# Patient Record
Sex: Male | Born: 1960 | Race: White | Hispanic: No | Marital: Married | Smoking: Former smoker
Health system: Southern US, Community
[De-identification: ages and names within clinical notes are randomized; demographics above are authoritative.]

## PROBLEM LIST (undated history)

## (undated) DIAGNOSIS — E119 Type 2 diabetes mellitus without complications: Secondary | ICD-10-CM

## (undated) DIAGNOSIS — I10 Essential (primary) hypertension: Secondary | ICD-10-CM

## (undated) DIAGNOSIS — K5900 Constipation, unspecified: Secondary | ICD-10-CM

## (undated) DIAGNOSIS — R0600 Dyspnea, unspecified: Secondary | ICD-10-CM

## (undated) DIAGNOSIS — E7800 Pure hypercholesterolemia, unspecified: Secondary | ICD-10-CM

## (undated) HISTORY — PX: NO PAST SURGERIES: SHX2092

---

## 2019-01-13 ENCOUNTER — Other Ambulatory Visit: Payer: Self-pay

## 2019-02-15 ENCOUNTER — Other Ambulatory Visit: Payer: Self-pay

## 2019-07-24 ENCOUNTER — Emergency Department
Admission: EM | Admit: 2019-07-24 | Discharge: 2019-07-24 | Disposition: A | Discharge status: Home / Self Care | Payer: Self-pay | Source: Home / Self Care | Attending: Emergency Medicine | Admitting: Emergency Medicine

## 2019-07-24 ENCOUNTER — Telehealth: Payer: Self-pay | Admitting: Emergency Medicine

## 2019-07-24 ENCOUNTER — Other Ambulatory Visit: Payer: Self-pay

## 2019-07-24 DIAGNOSIS — L97922 Non-pressure chronic ulcer of unspecified part of left lower leg with fat layer exposed: Secondary | ICD-10-CM

## 2019-07-24 DIAGNOSIS — L03818 Cellulitis of other sites: Secondary | ICD-10-CM

## 2019-07-24 DIAGNOSIS — E0841 Diabetes mellitus due to underlying condition with diabetic mononeuropathy: Secondary | ICD-10-CM

## 2019-07-24 DIAGNOSIS — R739 Hyperglycemia, unspecified: Secondary | ICD-10-CM

## 2019-07-24 DIAGNOSIS — S91109A Unspecified open wound of unspecified toe(s) without damage to nail, initial encounter: Secondary | ICD-10-CM

## 2019-07-24 DIAGNOSIS — Z23 Encounter for immunization: Secondary | ICD-10-CM

## 2019-07-24 DIAGNOSIS — L97912 Non-pressure chronic ulcer of unspecified part of right lower leg with fat layer exposed: Secondary | ICD-10-CM

## 2019-07-24 HISTORY — DX: Essential (primary) hypertension: I10

## 2019-07-24 HISTORY — DX: Pure hypercholesterolemia, unspecified: E78.00

## 2019-07-24 HISTORY — DX: Type 2 diabetes mellitus without complications: E11.9

## 2019-07-24 LAB — POCT FASTING CBG KUC MANUAL ENTRY: POCT Glucose (KUC): 265 mg/dL — AB (ref 70–99)

## 2019-07-24 MED ORDER — MUPIROCIN 2 % EX OINT: TOPICAL_OINTMENT | CUTANEOUS | 0 refills | Status: DC

## 2019-07-24 MED ORDER — AMOXICILLIN-POT CLAVULANATE 875-125 MG PO TABS: 1.0000 | ORAL_TABLET | Freq: Two times a day (BID) | ORAL | 0 refills | Status: DC

## 2019-07-24 MED ORDER — TETANUS-DIPHTH-ACELL PERTUSSIS 5-2.5-18.5 LF-MCG/0.5 IM SUSP
0.5000 mL | Freq: Once | INTRAMUSCULAR | Status: AC
  Administered 2019-07-24: 0.5 mL via INTRAMUSCULAR

## 2019-07-24 NOTE — ED Triage Notes (Signed)
Pt was at the pool 4-5 days ago, was walking on the pool deck and now has blisters on both feet, bleeding and red.  Also pain throbbing from about 6" above ankle down into feet.

## 2019-07-24 NOTE — ED Provider Notes (Signed)
[name]    CSN: [id number] Arrival date & time: 07/24/19  0901     History   Chief Complaint Chief Complaint  Patient presents with  . Foot Pain    HPI Melvin [name] is a 58 y.o. male.  Patient was in his usual state of health until Saturday which is 6 days ago when he was at the pool walking on hot concrete and suffered injuries to the great toes bilaterally.  Over the week he has developed redness and drainage over both toes.  He has severe pain when he stands.  He has been cleaning the areas with soap and water and applying bacitracin.  Of note he has a long history of diabetes currently on oral hypoglycemics as well as gabapentin for neuropathy. HPI  Past Medical History:  Diagnosis Date  . Diabetes mellitus without complication ([place])   . High cholesterol   . Hypertension     There are no active problems to display for this patient.   History reviewed. No pertinent surgical history.     Home Medications    Prior to Admission medications   Medication Sig Start Date End Date Taking? Authorizing Provider  atorvastatin (LIPITOR) 10 MG tablet Take 10 mg by mouth daily.   Yes [provider]  gabapentin (NEURONTIN) 300 MG capsule Take 300 mg by mouth 3 (three) times daily.   Yes [provider]  glyBURIDE (DIABETA) 2.5 MG tablet Take 2.5 mg by mouth daily with breakfast.   Yes [provider]  metFORMIN (GLUCOPHAGE) 1000 MG tablet Take 1,000 mg by mouth 2 (two) times daily with a meal.   Yes [provider]  amoxicillin-clavulanate (AUGMENTIN) 875-125 MG tablet Take 1 tablet by mouth 2 (two) times daily. 07/24/19   Melvin Russian, MD  mupirocin ointment (BACTROBAN) 2 % Apply twice a day to the undersurface of great toes 07/24/19   Atlas Kuc, Loura Back, MD    Family History History reviewed. No pertinent family history.  Social History Social History   Tobacco Use  . Smoking status: Never Smoker  . Smokeless tobacco: Never  Used  Substance Use Topics  . Alcohol use: Not on file    Comment: occ  . Drug use: Not on file     Allergies   Patient has no known allergies.   Review of Systems Review of Systems  Constitutional: Negative for fever.  Respiratory: Negative.   Cardiovascular: Negative.   Musculoskeletal: Negative.   Skin:       Patient has developed blisters over the undersurface of both great toes with significant pain and discomfort.     Physical Exam Triage Vital Signs ED Triage Vitals  Enc Vitals Group     BP 07/24/19 0956 (!) 131/93     Pulse Rate 07/24/19 0956 (!) 103     Resp 07/24/19 0956 20     Temp 07/24/19 0956 98 F (36.7 C)     Temp Source 07/24/19 0956 Oral     SpO2 07/24/19 0956 99 %     Weight 07/24/19 0957 262 lb (118.8 kg)     Height 07/24/19 0957 6\' 3"  (1.905 m)     Head Circumference --      Peak Flow --      Pain Score 07/24/19 0957 8     Pain Loc --      Pain Edu? --      Excl. in [place]? --    No data found.  Updated  Vital Signs BP (!) 131/93 (BP Location: Right Arm)   Pulse (!) 103   Temp 98 F (36.7 C) (Oral)   Resp 20   Ht 6\' 3"  (1.905 m)   Wt 118.8 kg   SpO2 99%   BMI 32.75 kg/m   Visual Acuity Right Eye Distance:   Left Eye Distance:   Bilateral Distance:    Right Eye Near:   Left Eye Near:    Bilateral Near:     Physical Exam Constitutional:      Appearance: Normal appearance.  Cardiovascular:     Pulses: Normal pulses.  Skin:    Comments: Examination of the lower extremities reveals a 3 x 3 cm blister areas over both great toes on the plantar surface.  There is necrotic tissue beneath dead epidermis.  The nonviable epidermis was removed by blunt dissection.  Bacitracin was applied to these areas followed by Telfa and a sterile dressing.  Neurological:     Mental Status: He is alert.   Procedure note: There were areas of dead tissue present over the plantar surface of both great toes.  These areas of skin were removed.  No  anesthesia was required due to the patient's neuropathy.  Underneath the skin was evidence of necrotic type material.  This will need further debridement in the future.  Bacitracin ointment was applied followed by Telfa and a sterile dressing.  Patient tolerated the procedure well.   UC Treatments / Results  Labs (all labs ordered are listed, but only abnormal results are displayed) Labs Reviewed  POCT FASTING [facility] MANUAL ENTRY - Abnormal; Notable for the following components:      Result Value   POCT Glucose ([facility]) 265 (*)    All other components within normal limits    EKG   Radiology No results found.  Procedures Procedures (including critical care time)  Medications Ordered in UC Medications  Tdap (BOOSTRIX) injection 0.5 mL (0.5 mLs Intramuscular Given 07/24/19 1047)    Initial Impression / Assessment and Plan / UC Course  I have reviewed the triage vital signs and the nursing notes. This patient has very poorly controlled diabetes.  Glucose checked in the office was 265.  He has neuropathy.  He developed burns to the undersurface of both great toes due to walking on hot concrete and inability to feel secondary to his neuropathy.  I advised him he will need to have further debridement of these areas.  I cautioned him about watching for worsening cellulitis.  He does have some redness of the great toes proximal to the wounds.  There is no evidence of cellulitis involving the feet.  Pulses were symmetrical. Pertinent labs & imaging results that were available during my care of the patient were reviewed by me and considered in my medical decision making (see chart for details).  Dr. Georgina Snell was kind enough to visualize the wounds and he will arrange follow-up with his office next week.  Patient will be on Augmentin twice a day along with application Bactroban ointment.  He is instructed to return to clinic if any worsening.  He will increase his Neurontin to 4 tablets a day.       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 Take oral antibiotics twice a day. Follow-up with Dr. Georgina Snell next week. If you become ill or start running fever or have progressive redness of your foot or ankle please return to the urgent care immediately.    ED Prescriptions    Medication Sig Dispense Auth. Provider   amoxicillin-clavulanate (AUGMENTIN) 875-125 MG tablet Take 1 tablet by mouth 2 (two) times daily. 20 tablet Melvin Russian, MD   mupirocin ointment (BACTROBAN) 2 % Apply twice a day to the undersurface of great toes 30 g Melvin Russian, MD     Controlled Substance Prescriptions [place] Controlled Substance Registry consulted? Not Applicable   Melvin Russian, MD 07/24/19 1323

## 2019-07-24 NOTE — Telephone Encounter (Signed)
I called and spoke with the patient and advised him to recheck in 48 to 72 hours if not improving.  If the wounds seem to be healing he will follow-up Monday afternoon with Dr. Joseph Art.  He has a follow-up appointment with Dr. Georgina Snell in 1 week.

## 2019-07-24 NOTE — Discharge Instructions (Signed)
Clean wound with Hibiclens twice a day followed by application of your antibiotic ointment. Take oral antibiotics twice a day. Follow-up with Dr. Georgina Snell next week. If you become ill or start running fever or have progressive redness of your foot or ankle please return to the urgent care immediately.

## 2019-07-28 ENCOUNTER — Emergency Department (INDEPENDENT_AMBULATORY_CARE_PROVIDER_SITE_OTHER)
Admission: EM | Admit: 2019-07-28 | Discharge: 2019-07-28 | Disposition: A | Discharge status: Home / Self Care | Payer: Self-pay | Source: Home / Self Care

## 2019-07-28 ENCOUNTER — Encounter: Payer: Self-pay | Admitting: Family Medicine

## 2019-07-28 ENCOUNTER — Other Ambulatory Visit: Payer: Self-pay

## 2019-07-28 DIAGNOSIS — Z5189 Encounter for other specified aftercare: Secondary | ICD-10-CM

## 2019-07-28 NOTE — Discharge Instructions (Addendum)
Continue the antibiotic cream and tablets Daily dressing change

## 2019-07-28 NOTE — ED Provider Notes (Signed)
[facility]    CSN: [id number] Arrival date & time: 07/28/19  1312     History   Chief Complaint Chief Complaint  Patient presents with  . Wound Check    HPI Melvin [name] is a 58 y.o. male.   58 yo established patient returns for recheck of toes, burned when he was walking on hot cement at side of swimming pool last week.  Injury incurred 10 days ago.  Patient has follow up with Dr. Georgina Snell in several days.  Associated problem:  Uncontrolled diabetes Current med for problem:  augmentin and mupirocin  Patient rec'd Tdap 4 days ago.     Past Medical History:  Diagnosis Date  . Diabetes mellitus without complication ([place])   . High cholesterol   . Hypertension     There are no active problems to display for this patient.   History reviewed. No pertinent surgical history.     Home Medications    Prior to Admission medications   Medication Sig Start Date End Date Taking? Authorizing Provider  amoxicillin-clavulanate (AUGMENTIN) 875-125 MG tablet Take 1 tablet by mouth 2 (two) times daily. 07/24/19   Darlyne Russian, MD  atorvastatin (LIPITOR) 10 MG tablet Take 10 mg by mouth daily.    [provider]  gabapentin (NEURONTIN) 300 MG capsule Take 300 mg by mouth 3 (three) times daily.    [provider]  glyBURIDE (DIABETA) 2.5 MG tablet Take 2.5 mg by mouth daily with breakfast.    [provider]  metFORMIN (GLUCOPHAGE) 1000 MG tablet Take 1,000 mg by mouth 2 (two) times daily with a meal.    [provider]  mupirocin ointment (BACTROBAN) 2 % Apply twice a day to the undersurface of great toes 07/24/19   Daub, Loura Back, MD    Family History History reviewed. No pertinent family history.  Social History Social History   Tobacco Use  . Smoking status: Never Smoker  . Smokeless tobacco: Never Used  Substance Use Topics  . Alcohol use: Not on file    Comment: occ  . Drug use: Not on file     Allergies   Patient has  no known allergies.   Review of Systems Review of Systems   Physical Exam Triage Vital Signs ED Triage Vitals  Enc Vitals Group     BP      Pulse      Resp      Temp      Temp src      SpO2      Weight      Height      Head Circumference      Peak Flow      Pain Score      Pain Loc      Pain Edu?      Excl. in [place]?    No data found.  Updated Vital Signs BP (!) 141/90 (BP Location: Right Arm)   Pulse (!) 115   Temp 98.1 F (36.7 C) (Oral)   Resp 20   Ht 6\' 3"  (1.905 m)   Wt 119.7 kg   SpO2 99%   BMI 33.00 kg/m    Physical Exam Vitals signs and nursing note reviewed.  Constitutional:      Appearance: Normal appearance.  Eyes:     Conjunctiva/sclera: Conjunctivae normal.  Neck:     Musculoskeletal: Normal range of motion and neck supple.  Cardiovascular:     Rate and Rhythm: Normal rate.  Pulmonary:     Effort: Pulmonary effort is normal.  Musculoskeletal: Normal range of motion.  Skin:    General: Skin is warm.     Comments: Bilateral great toe blisters are showing new epithelialization nicely without signs of infection  Neurological:     General: No focal deficit present.     Mental Status: He is alert.     Sensory: Sensory deficit present.  Psychiatric:        Mood and Affect: Mood normal.        Behavior: Behavior normal.        UC Treatments / Results  Labs (all labs ordered are listed, but only abnormal results are displayed) Labs Reviewed - No data to display  EKG   Radiology No results found.  Procedures Procedures (including critical care time)  Medications Ordered in UC Medications - No data to display  Initial Impression / Assessment and Plan / UC Course  I have reviewed the triage vital signs and the nursing notes.  Pertinent labs & imaging results that were available during my care of the patient were reviewed by me and considered in my medical decision making (see chart for details).    Final Clinical  Impressions(s) / UC Diagnoses   Final diagnoses:  Visit for wound check     Discharge Instructions     Continue the antibiotic cream and tablets Daily dressing change    ED Prescriptions    None     Controlled Substance Prescriptions [place] Controlled Substance Registry consulted? Not Applicable   Robyn Haber, MD 07/28/19 1357

## 2019-07-28 NOTE — ED Triage Notes (Signed)
Pt here for followup on foot ulcers

## 2019-07-31 ENCOUNTER — Ambulatory Visit: Payer: Self-pay | Admitting: Family Medicine

## 2019-07-31 ENCOUNTER — Encounter: Payer: Self-pay | Admitting: Family Medicine

## 2019-07-31 ENCOUNTER — Other Ambulatory Visit: Payer: Self-pay

## 2019-07-31 ENCOUNTER — Ambulatory Visit (INDEPENDENT_AMBULATORY_CARE_PROVIDER_SITE_OTHER): Payer: Self-pay | Admitting: Family Medicine

## 2019-07-31 VITALS — BP 137/96 | HR 103 | Temp 98.3°F | Wt 267.0 lb

## 2019-07-31 DIAGNOSIS — E785 Hyperlipidemia, unspecified: Secondary | ICD-10-CM

## 2019-07-31 DIAGNOSIS — T25229A Burn of second degree of unspecified foot, initial encounter: Secondary | ICD-10-CM

## 2019-07-31 DIAGNOSIS — E1142 Type 2 diabetes mellitus with diabetic polyneuropathy: Secondary | ICD-10-CM

## 2019-07-31 MED ORDER — METFORMIN HCL 1000 MG PO TABS: 1000.0000 mg | ORAL_TABLET | Freq: Two times a day (BID) | ORAL | 1 refills | Status: DC

## 2019-07-31 MED ORDER — GLIMEPIRIDE 4 MG PO TABS: 4.0000 mg | ORAL_TABLET | Freq: Every day | ORAL | 3 refills | Status: DC

## 2019-07-31 MED ORDER — ATORVASTATIN CALCIUM 10 MG PO TABS: 10.0000 mg | ORAL_TABLET | Freq: Every day | ORAL | 1 refills | Status: DC

## 2019-07-31 MED ORDER — GABAPENTIN 300 MG PO CAPS: 300.0000 mg | ORAL_CAPSULE | Freq: Three times a day (TID) | ORAL | 1 refills | Status: DC

## 2019-07-31 NOTE — Progress Notes (Signed)
Note duplication 

## 2019-07-31 NOTE — Progress Notes (Signed)
Melvin [name] is a 58 y.o. male who presents to [facility]: Primary Care Sports Medicine today for follow-up on BL diabetic foot ulcers.  Foot Ulcers: Melvin [name] visited Urgent Care on 07/28/2019 for ulcers on plantar aspects of distal phalanxes of great toes BL. Patient has Hx of diabetic neuropathy and he reported the injury event as walking on hot concrete for a brief time. Ulcers have been improving with care, rest, Mupirocin ointment, and oral Augmentin. Patient reports complete improvement of pain. He has been taking time off from work. 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  Type-2 Diabetes: Due to patient's self-pay status, he has not been followed by a regular PCP. He takes Metformin and Glyburide. Also takes Gabapentin for neuropathy. He has been taking 4 Gabapentin all at once in the morning as opposed to 4 times a day. Reports that his last A1c was taken in [place] at [facility] in [place]. Thought A1c was 12.  Self-Pay Status: Patient is currently self-pay. Reports that he will be granted health insurance benefits after working at his current job for 60-70 days.   ROS as above: No fevers chills nausea vomiting diarrhea chest pain palpitation shortness of breath.  Exam:  BP (!) 137/96   Pulse (!) 103   Temp 98.3 F (36.8 C) (Oral)   Wt 267 lb (121.1 kg)   BMI 33.37 kg/m  Wt Readings from Last 5 Encounters:  07/31/19 267 lb (121.1 kg)  07/28/19 264 lb (119.7 kg)  07/24/19 262 lb (118.8 kg)    Gen: Well NAD HEENT: EOMI,  MMM Lungs: Normal work of breathing. CTABL Heart: RRR no MRG Abd: NABS, Soft. Nondistended, Nontender Exts: BL ulcers over plantar aspects of distal phalanxes of great toes. Ulcers show good granulation tissue and no erythema extending around margins.  Sensation is intact  on plantar and dorsal foot surfaces BL. Brisk capillary refill, warm  and well perfused.   Lab and Radiology Results No results found for this or any previous visit (from the past 72 hour(s)). No results found.    Assessment and Plan: 58 y.o. male with Type-2 Diabetes is following up on BL foot ulcers.  Foot Ulcers: Melvin [name] visited Urgent Care on 07/28/2019 for ulcers on plantar aspects of distal phalanxes of great toes BL. Patient has Hx of diabetic neuropathy and reported the injury event as walking on hot concrete for a brief time. Ulcers have been improving with care, rest, Mupirocin ointment, and oral Augmentin. Patient reports complete improvement of pain. He has been taking time off from work. 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   Recommend continued care and rest. Placed first ray posts beneath insoles of shoes to take pressure off great toes. Can return to work on 8/24.  Type-2 Diabetes: Due to patient's self-pay status, he has not been followed by a regular PCP. He takes Metformin and Glyburide. Also takes Gabapentin for neuropathy. He has been taking 4 Gabapentin all at once in the morning as opposed to 4 times a day. Reports that his last A1c was taken in [place] at [facility] in [place]. Thought A1c was 12 suggesting uncontrolled diabetes.  Switched Glyburide to Glimepiride. Advised patient to take Gabapentin 3 times a day instead of all at one time. Will attempt to get records from [facility] in [place].  Ordered A1c, CMP, and Lipid  panel to assess diabetes, kidney, and lipid status.  Follow-up with Dr. Sheppard Coil in 3 months.  Self-Pay Status: Patient is currently self-pay. Reports that he will be granted health insurance benefits after working at his current job for 60-70 days.  Prior PCP was  Primary Care  [facility] in [facility] [place] [address] [place], [place] [address] [place]  [phone] Fax: [phone]   PDMP not reviewed this encounter.  Orders Placed This Encounter  Procedures  . COMPLETE METABOLIC PANEL WITH GFR  . Lipid Panel w/reflex Direct LDL  . Hemoglobin A1c   Meds ordered this encounter  Medications  . gabapentin (NEURONTIN) 300 MG capsule    Sig: Take 1 capsule (300 mg total) by mouth 3 (three) times daily.    Dispense:  270 capsule    Refill:  1  . glimepiride (AMARYL) 4 MG tablet    Sig: Take 1 tablet (4 mg total) by mouth daily before breakfast.    Dispense:  30 tablet    Refill:  3  . atorvastatin (LIPITOR) 10 MG tablet    Sig: Take 1 tablet (10 mg total) by mouth daily.    Dispense:  90 tablet    Refill:  1  . metFORMIN (GLUCOPHAGE) 1000 MG tablet    Sig: Take 1 tablet (1,000 mg total) by mouth 2 (two) times daily with a meal.    Dispense:  180 tablet    Refill:  1     Historical information moved to improve visibility of documentation.  Past Medical History:  Diagnosis Date  . Diabetes mellitus without complication ([place])   . High cholesterol   . Hypertension    No past surgical history on file. Social History   Tobacco Use  . Smoking status: Never Smoker  . Smokeless tobacco: Never Used  Substance Use Topics  . Alcohol use: Not on file    Comment: occ   family history is not on file.  Medications: Current Outpatient Medications  Medication Sig Dispense Refill  . amoxicillin-clavulanate (AUGMENTIN) 875-125 MG tablet Take 1 tablet by mouth 2 (two) times daily. 20 tablet 0  . atorvastatin (LIPITOR) 10 MG tablet Take 1 tablet (10 mg total) by mouth daily. 90 tablet 1  . gabapentin (NEURONTIN) 300 MG capsule Take 1 capsule (300 mg total) by mouth 3 (three) times daily. 270 capsule 1  . glimepiride (AMARYL) 4 MG tablet Take 1 tablet (4 mg total) by mouth daily before breakfast. 30 tablet 3  . metFORMIN (GLUCOPHAGE) 1000 MG tablet Take 1 tablet (1,000 mg total) by mouth 2 (two) times daily with a meal. 180 tablet 1  . mupirocin ointment (BACTROBAN) 2 % Apply twice a day to the  undersurface of great toes 30 g 0   No current facility-administered medications for this visit.    No Known Allergies   Discussed warning signs or symptoms. Please see discharge instructions. Patient expresses understanding.  I personally was present and performed or re-performed the history, physical exam and medical decision-making activities of this service and have verified that the service and findings are accurately documented in the student's note. ___________________________________________ Lynne Leader M.D., ABFM., CAQSM. Primary Care and Sports Medicine Adjunct Instructor of [employer]

## 2019-07-31 NOTE — Patient Instructions (Addendum)
Thank you for coming in today. Continue wound care at work.  Return to work on 8/24. If you cannot return let me know and I can modify the note.  Get labs fasting in the near future.  Sschedlue diabetes visiti with Dr Sheppard Coil in 3 months.   Continue medicine.  New prescription sent to [facility].  Use good Rx card.

## 2019-08-01 DIAGNOSIS — E1142 Type 2 diabetes mellitus with diabetic polyneuropathy: Secondary | ICD-10-CM | POA: Insufficient documentation

## 2019-08-01 DIAGNOSIS — T25229A Burn of second degree of unspecified foot, initial encounter: Secondary | ICD-10-CM | POA: Insufficient documentation

## 2019-08-01 DIAGNOSIS — E785 Hyperlipidemia, unspecified: Secondary | ICD-10-CM | POA: Insufficient documentation

## 2019-08-05 LAB — LIPID PANEL W/REFLEX DIRECT LDL
Cholesterol: 132 mg/dL (ref <200)
HDL: 33 mg/dL — ABNORMAL LOW (ref >=40)
LDL Cholesterol (Calc): 79 mg/dL (calc)
Non-HDL Cholesterol (Calc): 99 mg/dL (calc) (ref <130)
Total CHOL/HDL Ratio: 4 (calc) (ref <5.0)
Triglycerides: 118 mg/dL (ref <150)

## 2019-08-05 LAB — COMPLETE METABOLIC PANEL WITH GFR
AG Ratio: 1.5 (calc) (ref 1.0–2.5)
ALT: 26 U/L (ref 9–46)
AST: 15 U/L (ref 10–35)
Albumin: 4 g/dL (ref 3.6–5.1)
Alkaline phosphatase (APISO): 66 U/L (ref 35–144)
BUN: 15 mg/dL (ref 7–25)
CO2: 28 mmol/L (ref 20–32)
Calcium: 9.5 mg/dL (ref 8.6–10.3)
Chloride: 101 mmol/L (ref 98–110)
Creat: 0.7 mg/dL (ref 0.70–1.33)
GFR, Est African American: 122 mL/min/{1.73_m2} (ref >=60)
GFR, Est Non African American: 105 mL/min/{1.73_m2} (ref >=60)
Globulin: 2.6 g/dL (calc) (ref 1.9–3.7)
Glucose, Bld: 240 mg/dL — ABNORMAL HIGH (ref 65–99)
Potassium: 4.6 mmol/L (ref 3.5–5.3)
Sodium: 138 mmol/L (ref 135–146)
Total Bilirubin: 0.4 mg/dL (ref 0.2–1.2)
Total Protein: 6.6 g/dL (ref 6.1–8.1)

## 2019-08-05 LAB — HEMOGLOBIN A1C
Hgb A1c MFr Bld: 10.4 % of total Hgb — ABNORMAL HIGH (ref <5.7)
Mean Plasma Glucose: 252 (calc)
eAG (mmol/L): 13.9 (calc)

## 2019-10-31 ENCOUNTER — Other Ambulatory Visit: Payer: Self-pay

## 2019-10-31 ENCOUNTER — Ambulatory Visit (INDEPENDENT_AMBULATORY_CARE_PROVIDER_SITE_OTHER): Payer: Self-pay | Admitting: Osteopathic Medicine

## 2019-10-31 ENCOUNTER — Encounter: Payer: Self-pay | Admitting: Osteopathic Medicine

## 2019-10-31 VITALS — BP 138/90 | HR 92 | Temp 97.8°F | Wt 262.0 lb

## 2019-10-31 DIAGNOSIS — E1142 Type 2 diabetes mellitus with diabetic polyneuropathy: Secondary | ICD-10-CM

## 2019-10-31 DIAGNOSIS — Z23 Encounter for immunization: Secondary | ICD-10-CM

## 2019-10-31 LAB — POCT GLYCOSYLATED HEMOGLOBIN (HGB A1C): Hemoglobin A1C: 10.8 % — AB (ref 4.0–5.6)

## 2019-10-31 MED ORDER — METFORMIN HCL 1000 MG PO TABS: 1000.0000 mg | ORAL_TABLET | Freq: Two times a day (BID) | ORAL | 1 refills | Status: DC

## 2019-10-31 MED ORDER — LOSARTAN POTASSIUM 25 MG PO TABS: 25.0000 mg | ORAL_TABLET | Freq: Every day | ORAL | 1 refills | Status: DC

## 2019-10-31 MED ORDER — ATORVASTATIN CALCIUM 10 MG PO TABS: 10.0000 mg | ORAL_TABLET | Freq: Every day | ORAL | 1 refills | Status: DC

## 2019-10-31 MED ORDER — GLIMEPIRIDE 4 MG PO TABS: 4.0000 mg | ORAL_TABLET | Freq: Every day | ORAL | 1 refills | Status: DC

## 2019-10-31 NOTE — Progress Notes (Signed)
HPI: Melvin [name] is a 58 y.o. male who  has a past medical history of Diabetes mellitus without complication ([employer]), High cholesterol, and Hypertension.  he presents to [facility] today, 10/31/19,  for chief complaint of:  Diabetes follow-up   DIABETES SCREENING/PREVENTIVE CARE: A1C past 3-6 mos: Yes  controlled? No   08/04/19: 10.4% - Dr Georgina Snell had Rx Metformin 1000 mg bid and Glimepiride 4 mg daily (changed from Glyburide 2.5 mg daily)   Today 10/31/19: 10.8%  BP goal <130/80: No   BP Readings from Last 3 Encounters:  10/31/19 138/90  07/31/19 (!) 137/96  07/28/19 (!) 141/90   LDL goal <70: pretty close, 79 in 07/2019 Eye exam annually: none on file, importance discussed with patient Foot exam: needs Microalbuminuria:needs but will discuss ACE/ARB   Metformin: Yes  ACE/ARB: No  Antiplatelet if ASCVD Risk >10%: No  Statin: Yes but low-potency  Pneumovax: No  Immunization History  Administered Date(s) Administered  . Tdap 07/24/2019  . Zoster Recombinat (Shingrix) [id number]   DM2 complications: Neuropathy w/ history of foot ulceration d/t walking on hot concrete and not feeling it  Patient is accompanied by wife who assists with history-taking.    At today's visit 10/31/19 ... PMH, PSH, FH reviewed and updated as needed.  Current medication list and allergy/intolerance hx reviewed and updated as needed. (See remainder of HPI, ROS, Phys Exam below)   No results found.  Results for orders placed or performed in visit on 10/31/19 (from the past 72 hour(s))  POCT HgB A1C     Status: Abnormal   Collection Time: 10/31/19 10:23 AM  Result Value Ref Range   Hemoglobin A1C 10.8 (A) 4.0 - 5.6 %   HbA1c POC (<> result, manual entry)     HbA1c, POC (prediabetic range)     HbA1c, POC (controlled diabetic range)            ASSESSMENT/PLAN: The primary encounter diagnosis was Type 2 diabetes mellitus with diabetic polyneuropathy,  without long-term current use of insulin ([place]). A diagnosis of Need for shingles vaccine was also pertinent to this visit.  Long discussion on importance of glycemic control, target A1c.  This was new information for the most part, patient is hopeful he will be able to adjust diet, I let him know that this is definitely encouraged but probably not realistic in terms of getting A1c to goal, will probably need to start on some long-acting insulin.  Room to go up on Glimepiride but 8 as opposed to 4 tends to have only modest benefit  Metformin at max Ideally would like to get patient on LA insulin regime, financial status limits most orals/SGLT2 Will fill out patient assistance forms for insulin    Orders Placed This Encounter  Procedures  . Varicella-zoster vaccine IM (Shingrix)  . POCT HgB A1C     Meds ordered this encounter  Medications  . DISCONTD: losartan (COZAAR) 25 MG tablet    Sig: Take 1 tablet (25 mg total) by mouth daily.    Dispense:  90 tablet    Refill:  1  . DISCONTD: glimepiride (AMARYL) 4 MG tablet    Sig: Take 1 tablet (4 mg total) by mouth daily before breakfast.    Dispense:  90 tablet    Refill:  1  . DISCONTD: atorvastatin (LIPITOR) 10 MG tablet    Sig: Take 1 tablet (10 mg total) by mouth daily.    Dispense:  90 tablet  Refill:  1  . DISCONTD: metFORMIN (GLUCOPHAGE) 1000 MG tablet    Sig: Take 1 tablet (1,000 mg total) by mouth 2 (two) times daily with a meal.    Dispense:  180 tablet    Refill:  1  . glimepiride (AMARYL) 4 MG tablet    Sig: Take 1 tablet (4 mg total) by mouth daily before breakfast.    Dispense:  90 tablet    Refill:  1  . metFORMIN (GLUCOPHAGE) 1000 MG tablet    Sig: Take 1 tablet (1,000 mg total) by mouth 2 (two) times daily with a meal.    Dispense:  180 tablet    Refill:  1  . losartan (COZAAR) 25 MG tablet    Sig: Take 1 tablet (25 mg total) by mouth daily.    Dispense:  90 tablet    Refill:  1  . atorvastatin (LIPITOR) 10 MG  tablet    Sig: Take 1 tablet (10 mg total) by mouth daily.    Dispense:  90 tablet    Refill:  1    Patient Instructions  Plan:  Will work on getting insulin - if unable to get assistance through the drug company will work on getting you samples!   Will refill what you are taking for now.    Will add BP medication to help protect the kidneys and lower BP to 130/80 or less.   See below for diet changes!     Diabetes Mellitus and Nutrition, Adult When you have diabetes (diabetes mellitus), it is very important to have healthy eating habits because your blood sugar (glucose) levels are greatly affected by what you eat and drink. Eating healthy foods in the appropriate amounts, at about the same times every day, can help you:  Control your blood glucose.  Lower your risk of heart disease.  Improve your blood pressure.  Reach or maintain a healthy weight. Every person with diabetes is different, and each person has different needs for a meal plan. Your health care provider may recommend that you work with a diet and nutrition specialist (dietitian) to make a meal plan that is best for you. Your meal plan may vary depending on factors such as:  The calories you need.  The medicines you take.  Your weight.  Your blood glucose, blood pressure, and cholesterol levels.  Your activity level.  Other health conditions you have, such as heart or kidney disease. How do carbohydrates affect me? Carbohydrates, also called carbs, affect your blood glucose level more than any other type of food. Eating carbs naturally raises the amount of glucose in your blood. Carb counting is a method for keeping track of how many carbs you eat. Counting carbs is important to keep your blood glucose at a healthy level, especially if you use insulin or take certain oral diabetes medicines. It is important to know how many carbs you can safely have in each meal. This is different for every person. Your  dietitian can help you calculate how many carbs you should have at each meal and for each snack. Foods that contain carbs include:  Bread, cereal, rice, pasta, and crackers.  Potatoes and corn.  Peas, beans, and lentils.  Milk and yogurt.  Fruit and juice.  Desserts, such as cakes, cookies, ice cream, and candy. How does alcohol affect me? Alcohol can cause a sudden decrease in blood glucose (hypoglycemia), especially if you use insulin or take certain oral diabetes medicines. Hypoglycemia can be a life-threatening  condition. Symptoms of hypoglycemia (sleepiness, dizziness, and confusion) are similar to symptoms of having too much alcohol. If your health care provider says that alcohol is safe for you, follow these guidelines:  Limit alcohol intake to no more than 1 drink per day for nonpregnant women and 2 drinks per day for men. One drink equals 12 oz of beer, 5 oz of wine, or 1 oz of hard liquor.  Do not drink on an empty stomach.  Keep yourself hydrated with water, diet soda, or unsweetened iced tea.  Keep in mind that regular soda, juice, and other mixers may contain a lot of sugar and must be counted as carbs. What are tips for following this plan?  Reading food labels  Start by checking the serving size on the "Nutrition Facts" label of packaged foods and drinks. The amount of calories, carbs, fats, and other nutrients listed on the label is based on one serving of the item. Many items contain more than one serving per package.  Check the total grams (g) of carbs in one serving. You can calculate the number of servings of carbs in one serving by dividing the total carbs by 15. For example, if a food has 30 g of total carbs, it would be equal to 2 servings of carbs.  Check the number of grams (g) of saturated and trans fats in one serving. Choose foods that have low or no amount of these fats.  Check the number of milligrams (mg) of salt (sodium) in one serving. Most people  should limit total sodium intake to less than 2,300 mg per day.  Always check the nutrition information of foods labeled as "low-fat" or "nonfat". These foods may be higher in added sugar or refined carbs and should be avoided.  Talk to your dietitian to identify your daily goals for nutrients listed on the label. Shopping  Avoid buying canned, premade, or processed foods. These foods tend to be high in fat, sodium, and added sugar.  Shop around the outside edge of the grocery store. This includes fresh fruits and vegetables, bulk grains, fresh meats, and fresh dairy. Cooking  Use low-heat cooking methods, such as baking, instead of high-heat cooking methods like deep frying.  Cook using healthy oils, such as olive, canola, or sunflower oil.  Avoid cooking with butter, cream, or high-fat meats. Meal planning  Eat meals and snacks regularly, preferably at the same times every day. Avoid going long periods of time without eating.  Eat foods high in fiber, such as fresh fruits, vegetables, beans, and whole grains. Talk to your dietitian about how many servings of carbs you can eat at each meal.  Eat 4-6 ounces (oz) of lean protein each day, such as lean meat, chicken, fish, eggs, or tofu. One oz of lean protein is equal to: ? 1 oz of meat, chicken, or fish. ? 1 egg. ?  cup of tofu.  Eat some foods each day that contain healthy fats, such as avocado, nuts, seeds, and fish. Lifestyle  Check your blood glucose regularly.  Exercise regularly as told by your health care provider. This may include: ? 150 minutes of moderate-intensity or vigorous-intensity exercise each week. This could be brisk walking, biking, or water aerobics. ? Stretching and doing strength exercises, such as yoga or weightlifting, at least 2 times a week.  Take medicines as told by your health care provider.  Do not use any products that contain nicotine or tobacco, such as cigarettes and e-cigarettes. If you  need  help quitting, ask your health care provider.  Work with a Social worker or diabetes educator to identify strategies to manage stress and any emotional and social challenges. Questions to ask a health care provider  Do I need to meet with a diabetes educator?  Do I need to meet with a dietitian?  What number can I call if I have questions?  When are the best times to check my blood glucose? Where to find more information:  American Diabetes Association: diabetes.org  Academy of Nutrition and Dietetics: www.eatright.[employer] of Diabetes and Digestive and Kidney Diseases (NIH): [employer] Summary  A healthy meal plan will help you control your blood glucose and maintain a healthy lifestyle.  Working with a diet and nutrition specialist (dietitian) can help you make a meal plan that is best for you.  Keep in mind that carbohydrates (carbs) and alcohol have immediate effects on your blood glucose levels. It is important to count carbs and to use alcohol carefully. This information is not intended to replace advice given to you by your health care provider. Make sure you discuss any questions you have with your health care provider. Document Released: 08/31/2005 Document Revised: 11/16/2017 Document Reviewed: 01/08/2017 Elsevier Patient Education  2020 [place] for Diabetes Mellitus, Adult  Carbohydrate counting is a method of keeping track of how many carbohydrates you eat. Eating carbohydrates naturally increases the amount of sugar (glucose) in the blood. Counting how many carbohydrates you eat helps keep your blood glucose within normal limits, which helps you manage your diabetes (diabetes mellitus). It is important to know how many carbohydrates you can safely have in each meal. This is different for every person. A diet and nutrition specialist (registered dietitian) can help you make a meal plan and calculate how many  carbohydrates you should have at each meal and snack. Carbohydrates are found in the following foods:  Grains, such as breads and cereals.  Dried beans and soy products.  Starchy vegetables, such as potatoes, peas, and corn.  Fruit and fruit juices.  Milk and yogurt.  Sweets and snack foods, such as cake, cookies, candy, chips, and soft drinks. How do I count carbohydrates? There are two ways to count carbohydrates in food. You can use either of the methods or a combination of both. Reading "Nutrition Facts" on packaged food The "Nutrition Facts" list is included on the labels of almost all packaged foods and beverages in the [place] It includes:  The serving size.  Information about nutrients in each serving, including the grams (g) of carbohydrate per serving. To use the "Nutrition Facts":  Decide how many servings you will have.  Multiply the number of servings by the number of carbohydrates per serving.  The resulting number is the total amount of carbohydrates that you will be having. Learning standard serving sizes of other foods When you eat carbohydrate foods that are not packaged or do not include "Nutrition Facts" on the label, you need to measure the servings in order to count the amount of carbohydrates:  Measure the foods that you will eat with a food scale or measuring cup, if needed.  Decide how many standard-size servings you will eat.  Multiply the number of servings by 15. Most carbohydrate-rich foods have about 15 g of carbohydrates per serving. ? For example, if you eat 8 oz (170 g) of strawberries, you will have eaten 2 servings and 30 g of carbohydrates (2 servings x 15  g = 30 g).  For foods that have more than one food mixed, such as soups and casseroles, you must count the carbohydrates in each food that is included. The following list contains standard serving sizes of common carbohydrate-rich foods. Each of these servings has about 15 g of carbohydrates:    hamburger bun or  English muffin.   oz (15 mL) syrup.   oz (14 g) jelly.  1 slice of bread.  1 six-inch tortilla.  3 oz (85 g) cooked rice or pasta.  4 oz (113 g) cooked dried beans.  4 oz (113 g) starchy vegetable, such as peas, corn, or potatoes.  4 oz (113 g) hot cereal.  4 oz (113 g) mashed potatoes or  of a large baked potato.  4 oz (113 g) canned or frozen fruit.  4 oz (120 mL) fruit juice.  4-6 crackers.  6 chicken nuggets.  6 oz (170 g) unsweetened dry cereal.  6 oz (170 g) plain fat-free yogurt or yogurt sweetened with artificial sweeteners.  8 oz (240 mL) milk.  8 oz (170 g) fresh fruit or one small piece of fruit.  24 oz (680 g) popped popcorn. Example of carbohydrate counting Sample meal  3 oz (85 g) chicken breast.  6 oz (170 g) brown rice.  4 oz (113 g) corn.  8 oz (240 mL) milk.  8 oz (170 g) strawberries with sugar-free whipped topping. Carbohydrate calculation 1. Identify the foods that contain carbohydrates: ? Rice. ? Corn. ? Milk. ? Strawberries. 2. Calculate how many servings you have of each food: ? 2 servings rice. ? 1 serving corn. ? 1 serving milk. ? 1 serving strawberries. 3. Multiply each number of servings by 15 g: ? 2 servings rice x 15 g = 30 g. ? 1 serving corn x 15 g = 15 g. ? 1 serving milk x 15 g = 15 g. ? 1 serving strawberries x 15 g = 15 g. 4. Add together all of the amounts to find the total grams of carbohydrates eaten: ? 30 g + 15 g + 15 g + 15 g = 75 g of carbohydrates total. Summary  Carbohydrate counting is a method of keeping track of how many carbohydrates you eat.  Eating carbohydrates naturally increases the amount of sugar (glucose) in the blood.  Counting how many carbohydrates you eat helps keep your blood glucose within normal limits, which helps you manage your diabetes.  A diet and nutrition specialist (registered dietitian) can help you make a meal plan and calculate how many  carbohydrates you should have at each meal and snack. This information is not intended to replace advice given to you by your health care provider. Make sure you discuss any questions you have with your health care provider. Document Released: 12/04/2005 Document Revised: 06/28/2017 Document Reviewed: 05/17/2016 Elsevier Patient Education  2020 [employer].       Follow-up plan: Return in about 4 months (around 02/28/2020) for recheck A1C.                                                 ################################################# ################################################# ################################################# #################################################    Current Meds  Medication Sig  . atorvastatin (LIPITOR) 10 MG tablet Take 1 tablet (10 mg total) by mouth daily.  Marland Kitchen gabapentin (NEURONTIN) 300 MG capsule Take 1 capsule (300 mg  total) by mouth 3 (three) times daily.  Marland Kitchen glimepiride (AMARYL) 4 MG tablet Take 1 tablet (4 mg total) by mouth daily before breakfast.  . metFORMIN (GLUCOPHAGE) 1000 MG tablet Take 1 tablet (1,000 mg total) by mouth 2 (two) times daily with a meal.  . mupirocin ointment (BACTROBAN) 2 % Apply twice a day to the undersurface of great toes  . [DISCONTINUED] atorvastatin (LIPITOR) 10 MG tablet Take 1 tablet (10 mg total) by mouth daily.  . [DISCONTINUED] atorvastatin (LIPITOR) 10 MG tablet Take 1 tablet (10 mg total) by mouth daily.  . [DISCONTINUED] glimepiride (AMARYL) 4 MG tablet Take 1 tablet (4 mg total) by mouth daily before breakfast.  . [DISCONTINUED] glimepiride (AMARYL) 4 MG tablet Take 1 tablet (4 mg total) by mouth daily before breakfast.  . [DISCONTINUED] metFORMIN (GLUCOPHAGE) 1000 MG tablet Take 1 tablet (1,000 mg total) by mouth 2 (two) times daily with a meal.  . [DISCONTINUED] metFORMIN (GLUCOPHAGE) 1000 MG tablet Take 1 tablet (1,000 mg total) by mouth 2 (two) times  daily with a meal.    No Known Allergies     Review of Systems:  Constitutional: No recent illness  HEENT: No  headache, no vision change  Cardiac: No  chest pain, No  pressure, No palpitations  Respiratory:  No  shortness of breath. No  Cough  Gastrointestinal: No  abdominal pain  Neurologic: No  weakness, No  Dizziness  Psychiatric: No  concerns with depression, No  concerns with anxiety  Exam:  BP 138/90 (BP Location: Left Arm, Patient Position: Sitting, Cuff Size: Large)   Pulse 92   Temp 97.8 F (36.6 C) (Oral)   Wt 262 lb (118.8 kg)   BMI 32.75 kg/m   Constitutional: VS see above. General Appearance: alert, well-developed, well-nourished, NAD  Eyes: Normal lids and conjunctive, non-icteric sclera  Neck: No masses, trachea midline.   Respiratory: Normal respiratory effort.  Musculoskeletal: Gait normal. Symmetric and independent movement of all extremities  Neurological: Normal balance/coordination. No tremor.  Skin: warm, dry, intact.   Psychiatric: Normal judgment/insight. Normal mood and affect. Oriented x3.       Visit summary with medication list and pertinent instructions was printed for patient to review, patient was advised to alert [place] if any updates are needed. All questions at time of visit were answered - patient instructed to contact office with any additional concerns. ER/RTC precautions were reviewed with the patient and understanding verbalized.   Note: Total time spent 25 minutes, greater than 50% of the visit was spent face-to-face counseling and coordinating care for the following: The primary encounter diagnosis was Type 2 diabetes mellitus with diabetic polyneuropathy, without long-term current use of insulin ([place]). A diagnosis of Need for shingles vaccine was also pertinent to this visit.Marland Kitchen  Please note: voice recognition software was used to produce this document, and typos may escape review. Please contact Dr. Sheppard Coil for any needed  clarifications.    Follow up plan: Return in about 4 months (around 02/28/2020) for recheck A1C.

## 2019-10-31 NOTE — Patient Instructions (Addendum)
Plan:  Will work on getting insulin - if unable to get assistance through the drug company will work on getting you samples!   Will refill what you are taking for now.    Will add BP medication to help protect the kidneys and lower BP to 130/80 or less.   See below for diet changes!     Diabetes Mellitus and Nutrition, Adult When you have diabetes (diabetes mellitus), it is very important to have healthy eating habits because your blood sugar (glucose) levels are greatly affected by what you eat and drink. Eating healthy foods in the appropriate amounts, at about the same times every day, can help you:  Control your blood glucose.  Lower your risk of heart disease.  Improve your blood pressure.  Reach or maintain a healthy weight. Every person with diabetes is different, and each person has different needs for a meal plan. Your health care provider may recommend that you work with a diet and nutrition specialist (dietitian) to make a meal plan that is best for you. Your meal plan may vary depending on factors such as:  The calories you need.  The medicines you take.  Your weight.  Your blood glucose, blood pressure, and cholesterol levels.  Your activity level.  Other health conditions you have, such as heart or kidney disease. How do carbohydrates affect me? Carbohydrates, also called carbs, affect your blood glucose level more than any other type of food. Eating carbs naturally raises the amount of glucose in your blood. Carb counting is a method for keeping track of how many carbs you eat. Counting carbs is important to keep your blood glucose at a healthy level, especially if you use insulin or take certain oral diabetes medicines. It is important to know how many carbs you can safely have in each meal. This is different for every person. Your dietitian can help you calculate how many carbs you should have at each meal and for each snack. Foods that contain carbs  include:  Bread, cereal, rice, pasta, and crackers.  Potatoes and corn.  Peas, beans, and lentils.  Milk and yogurt.  Fruit and juice.  Desserts, such as cakes, cookies, ice cream, and candy. How does alcohol affect me? Alcohol can cause a sudden decrease in blood glucose (hypoglycemia), especially if you use insulin or take certain oral diabetes medicines. Hypoglycemia can be a life-threatening condition. Symptoms of hypoglycemia (sleepiness, dizziness, and confusion) are similar to symptoms of having too much alcohol. If your health care provider says that alcohol is safe for you, follow these guidelines:  Limit alcohol intake to no more than 1 drink per day for nonpregnant women and 2 drinks per day for men. One drink equals 12 oz of beer, 5 oz of wine, or 1 oz of hard liquor.  Do not drink on an empty stomach.  Keep yourself hydrated with water, diet soda, or unsweetened iced tea.  Keep in mind that regular soda, juice, and other mixers may contain a lot of sugar and must be counted as carbs. What are tips for following this plan?  Reading food labels  Start by checking the serving size on the "Nutrition Facts" label of packaged foods and drinks. The amount of calories, carbs, fats, and other nutrients listed on the label is based on one serving of the item. Many items contain more than one serving per package.  Check the total grams (g) of carbs in one serving. You can calculate the number of servings  of carbs in one serving by dividing the total carbs by 15. For example, if a food has 30 g of total carbs, it would be equal to 2 servings of carbs.  Check the number of grams (g) of saturated and trans fats in one serving. Choose foods that have low or no amount of these fats.  Check the number of milligrams (mg) of salt (sodium) in one serving. Most people should limit total sodium intake to less than 2,300 mg per day.  Always check the nutrition information of foods labeled  as "low-fat" or "nonfat". These foods may be higher in added sugar or refined carbs and should be avoided.  Talk to your dietitian to identify your daily goals for nutrients listed on the label. Shopping  Avoid buying canned, premade, or processed foods. These foods tend to be high in fat, sodium, and added sugar.  Shop around the outside edge of the grocery store. This includes fresh fruits and vegetables, bulk grains, fresh meats, and fresh dairy. Cooking  Use low-heat cooking methods, such as baking, instead of high-heat cooking methods like deep frying.  Cook using healthy oils, such as olive, canola, or sunflower oil.  Avoid cooking with butter, cream, or high-fat meats. Meal planning  Eat meals and snacks regularly, preferably at the same times every day. Avoid going long periods of time without eating.  Eat foods high in fiber, such as fresh fruits, vegetables, beans, and whole grains. Talk to your dietitian about how many servings of carbs you can eat at each meal.  Eat 4-6 ounces (oz) of lean protein each day, such as lean meat, chicken, fish, eggs, or tofu. One oz of lean protein is equal to: ? 1 oz of meat, chicken, or fish. ? 1 egg. ?  cup of tofu.  Eat some foods each day that contain healthy fats, such as avocado, nuts, seeds, and fish. Lifestyle  Check your blood glucose regularly.  Exercise regularly as told by your health care provider. This may include: ? 150 minutes of moderate-intensity or vigorous-intensity exercise each week. This could be brisk walking, biking, or water aerobics. ? Stretching and doing strength exercises, such as yoga or weightlifting, at least 2 times a week.  Take medicines as told by your health care provider.  Do not use any products that contain nicotine or tobacco, such as cigarettes and e-cigarettes. If you need help quitting, ask your health care provider.  Work with a Social worker or diabetes educator to identify strategies to  manage stress and any emotional and social challenges. Questions to ask a health care provider  Do I need to meet with a diabetes educator?  Do I need to meet with a dietitian?  What number can I call if I have questions?  When are the best times to check my blood glucose? Where to find more information:  American Diabetes Association: diabetes.org  Academy of Nutrition and Dietetics: www.eatright.[employer] of Diabetes and Digestive and Kidney Diseases (NIH): [employer] Summary  A healthy meal plan will help you control your blood glucose and maintain a healthy lifestyle.  Working with a diet and nutrition specialist (dietitian) can help you make a meal plan that is best for you.  Keep in mind that carbohydrates (carbs) and alcohol have immediate effects on your blood glucose levels. It is important to count carbs and to use alcohol carefully. This information is not intended to replace advice given to you by your health care provider. Make sure  you discuss any questions you have with your health care provider. Document Released: 08/31/2005 Document Revised: 11/16/2017 Document Reviewed: 01/08/2017 Elsevier Patient Education  2020 [place] for Diabetes Mellitus, Adult  Carbohydrate counting is a method of keeping track of how many carbohydrates you eat. Eating carbohydrates naturally increases the amount of sugar (glucose) in the blood. Counting how many carbohydrates you eat helps keep your blood glucose within normal limits, which helps you manage your diabetes (diabetes mellitus). It is important to know how many carbohydrates you can safely have in each meal. This is different for every person. A diet and nutrition specialist (registered dietitian) can help you make a meal plan and calculate how many carbohydrates you should have at each meal and snack. Carbohydrates are found in the following foods:  Grains, such as breads  and cereals.  Dried beans and soy products.  Starchy vegetables, such as potatoes, peas, and corn.  Fruit and fruit juices.  Milk and yogurt.  Sweets and snack foods, such as cake, cookies, candy, chips, and soft drinks. How do I count carbohydrates? There are two ways to count carbohydrates in food. You can use either of the methods or a combination of both. Reading "Nutrition Facts" on packaged food The "Nutrition Facts" list is included on the labels of almost all packaged foods and beverages in the [place] It includes:  The serving size.  Information about nutrients in each serving, including the grams (g) of carbohydrate per serving. To use the "Nutrition Facts":  Decide how many servings you will have.  Multiply the number of servings by the number of carbohydrates per serving.  The resulting number is the total amount of carbohydrates that you will be having. Learning standard serving sizes of other foods When you eat carbohydrate foods that are not packaged or do not include "Nutrition Facts" on the label, you need to measure the servings in order to count the amount of carbohydrates:  Measure the foods that you will eat with a food scale or measuring cup, if needed.  Decide how many standard-size servings you will eat.  Multiply the number of servings by 15. Most carbohydrate-rich foods have about 15 g of carbohydrates per serving. ? For example, if you eat 8 oz (170 g) of strawberries, you will have eaten 2 servings and 30 g of carbohydrates (2 servings x 15 g = 30 g).  For foods that have more than one food mixed, such as soups and casseroles, you must count the carbohydrates in each food that is included. The following list contains standard serving sizes of common carbohydrate-rich foods. Each of these servings has about 15 g of carbohydrates:   hamburger bun or  English muffin.   oz (15 mL) syrup.   oz (14 g) jelly.  1 slice of bread.  1 six-inch  tortilla.  3 oz (85 g) cooked rice or pasta.  4 oz (113 g) cooked dried beans.  4 oz (113 g) starchy vegetable, such as peas, corn, or potatoes.  4 oz (113 g) hot cereal.  4 oz (113 g) mashed potatoes or  of a large baked potato.  4 oz (113 g) canned or frozen fruit.  4 oz (120 mL) fruit juice.  4-6 crackers.  6 chicken nuggets.  6 oz (170 g) unsweetened dry cereal.  6 oz (170 g) plain fat-free yogurt or yogurt sweetened with artificial sweeteners.  8 oz (240 mL) milk.  8 oz (170 g) fresh fruit  or one small piece of fruit.  24 oz (680 g) popped popcorn. Example of carbohydrate counting Sample meal  3 oz (85 g) chicken breast.  6 oz (170 g) brown rice.  4 oz (113 g) corn.  8 oz (240 mL) milk.  8 oz (170 g) strawberries with sugar-free whipped topping. Carbohydrate calculation 1. Identify the foods that contain carbohydrates: ? Rice. ? Corn. ? Milk. ? Strawberries. 2. Calculate how many servings you have of each food: ? 2 servings rice. ? 1 serving corn. ? 1 serving milk. ? 1 serving strawberries. 3. Multiply each number of servings by 15 g: ? 2 servings rice x 15 g = 30 g. ? 1 serving corn x 15 g = 15 g. ? 1 serving milk x 15 g = 15 g. ? 1 serving strawberries x 15 g = 15 g. 4. Add together all of the amounts to find the total grams of carbohydrates eaten: ? 30 g + 15 g + 15 g + 15 g = 75 g of carbohydrates total. Summary  Carbohydrate counting is a method of keeping track of how many carbohydrates you eat.  Eating carbohydrates naturally increases the amount of sugar (glucose) in the blood.  Counting how many carbohydrates you eat helps keep your blood glucose within normal limits, which helps you manage your diabetes.  A diet and nutrition specialist (registered dietitian) can help you make a meal plan and calculate how many carbohydrates you should have at each meal and snack. This information is not intended to replace advice given to you by  your health care provider. Make sure you discuss any questions you have with your health care provider. Document Released: 12/04/2005 Document Revised: 06/28/2017 Document Reviewed: 05/17/2016 Elsevier Patient Education  2020 [employer].

## 2019-12-15 ENCOUNTER — Telehealth: Payer: Self-pay

## 2019-12-15 NOTE — Telephone Encounter (Signed)
Pt left a vm msg stating he continues to be dizzy all the time. Pt did mentioned it to provider at initial visit. He continues to have out of range fasting blood sugars in the morning/through out the day. Pt stated that his blood sugar marks really low or really high, which is causing his dizziness. Requesting if provider recommends adding an insulin to his current diabetes meds. Pls advise, thanks.

## 2019-12-15 NOTE — Telephone Encounter (Signed)
Pt left a second vm msg regarding previous telephone encounter. Pt wants to know what will be provider's recommendation. Pt also mentioned whether he should come in for another appt. Pls advise, thanks.

## 2019-12-15 NOTE — Telephone Encounter (Signed)
From alst visit summary - he was supposed to bring back patient assistance paperwork so I could get him insulin so yes that was the plan. He eneds to bring me these forms   Will work on getting insulin - if unable to get assistance through the drug company will work on getting you samples!  Will refill what you are taking for now.   Will add BP medication to help protect the kidneys and lower BP to 130/80 or less.

## 2019-12-15 NOTE — Telephone Encounter (Signed)
Left message for patient to call back  

## 2019-12-15 NOTE — Telephone Encounter (Signed)
Pt has been updated and aware of provider's note. Pt will try to bring in the pt assistance form by tomorrow. Pt has been informed that if blood sugar range is extremely high to report to nearest UC/ER for evaluation. He was agreeable with plan. No other inquiries during call.

## 2019-12-18 NOTE — Telephone Encounter (Signed)
Patient assistance was faxed over for this patient.

## 2020-01-16 NOTE — Telephone Encounter (Signed)
Forms sent to scan

## 2020-02-06 ENCOUNTER — Other Ambulatory Visit: Payer: Self-pay | Admitting: Family Medicine

## 2020-02-08 ENCOUNTER — Other Ambulatory Visit: Payer: Self-pay

## 2020-02-08 ENCOUNTER — Emergency Department
Admission: EM | Admit: 2020-02-08 | Discharge: 2020-02-08 | Disposition: A | Discharge status: Home / Self Care | Payer: Self-pay | Source: Home / Self Care

## 2020-02-08 DIAGNOSIS — R5383 Other fatigue: Secondary | ICD-10-CM

## 2020-02-08 DIAGNOSIS — R739 Hyperglycemia, unspecified: Secondary | ICD-10-CM

## 2020-02-08 LAB — POCT URINALYSIS DIP (MANUAL ENTRY)
Bilirubin, UA: NEGATIVE
Blood, UA: NEGATIVE
Glucose, UA: 500 mg/dL — AB
Ketones, POC UA: NEGATIVE mg/dL
Leukocytes, UA: NEGATIVE
Nitrite, UA: NEGATIVE
Protein Ur, POC: NEGATIVE mg/dL
Spec Grav, UA: 1.01 (ref 1.010–1.025)
Urobilinogen, UA: 1 E.U./dL
pH, UA: 5 (ref 5.0–8.0)

## 2020-02-08 LAB — POCT FASTING CBG KUC MANUAL ENTRY: POCT Glucose (KUC): 399 mg/dL — AB (ref 70–99)
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
## 2020-02-08 NOTE — ED Triage Notes (Signed)
Here with progressive weakness, dizziness that started 1 week ago. Employer sent to [facility] for COVID test; neg results. HX DM taking Glimeperide, Metformin,. Last A1c 10. Denies blurred vision, nausea, vomiting.

## 2020-02-08 NOTE — Discharge Instructions (Addendum)
Increase Metformin to 1-1/2 tablets twice daily with food Continue to restrict carbohydrates whenever possible Follow-up with local physician in 3 weeks

## 2020-02-10 LAB — SARS-COV-2 RNA,(COVID-19) QUALITATIVE NAAT: SARS CoV2 RNA: NOT DETECTED

## 2020-02-11 ENCOUNTER — Telehealth: Payer: Self-pay

## 2020-02-11 MED ORDER — ONDANSETRON HCL 4 MG PO TABS: 4.0000 mg | ORAL_TABLET | Freq: Three times a day (TID) | ORAL | 0 refills | Status: DC | PRN

## 2020-02-11 NOTE — Telephone Encounter (Signed)
Pt called and stated that he is continuing to have nausea, emesis, and diarrhea.  Also body aches in the neck and back.  Spoke with Dr Sabra Heck and dr Sabra Heck ordered Zofran, and imodium.  Notified patient.

## 2020-02-16 ENCOUNTER — Telehealth: Payer: Self-pay | Admitting: Osteopathic Medicine

## 2020-02-16 ENCOUNTER — Other Ambulatory Visit: Payer: Self-pay

## 2020-02-16 ENCOUNTER — Ambulatory Visit (INDEPENDENT_AMBULATORY_CARE_PROVIDER_SITE_OTHER): Payer: PRIVATE HEALTH INSURANCE | Admitting: Osteopathic Medicine

## 2020-02-16 ENCOUNTER — Encounter: Payer: Self-pay | Admitting: Osteopathic Medicine

## 2020-02-16 VITALS — BP 138/89 | HR 98 | Temp 98.1°F | Wt 235.0 lb

## 2020-02-16 DIAGNOSIS — R634 Abnormal weight loss: Secondary | ICD-10-CM | POA: Diagnosis not present

## 2020-02-16 DIAGNOSIS — R5383 Other fatigue: Secondary | ICD-10-CM | POA: Diagnosis not present

## 2020-02-16 DIAGNOSIS — E1142 Type 2 diabetes mellitus with diabetic polyneuropathy: Secondary | ICD-10-CM | POA: Diagnosis not present

## 2020-02-16 DIAGNOSIS — R112 Nausea with vomiting, unspecified: Secondary | ICD-10-CM

## 2020-02-16 LAB — POCT URINALYSIS DIPSTICK
Bilirubin, UA: NEGATIVE
Blood, UA: NEGATIVE
Glucose, UA: POSITIVE — AB
Leukocytes, UA: NEGATIVE
Nitrite, UA: NEGATIVE
Protein, UA: NEGATIVE
Spec Grav, UA: 1.015 (ref 1.010–1.025)
Urobilinogen, UA: 4 E.U./dL — AB
pH, UA: 6 (ref 5.0–8.0)

## 2020-02-16 LAB — GLUCOSE, POCT (MANUAL RESULT ENTRY): POC Glucose: >440 mg/dl (ref 70–99)

## 2020-02-16 LAB — POCT UA - MICROALBUMIN
Creatinine, POC: 100 mg/dL
Microalbumin Ur, POC: 30 mg/L

## 2020-02-16 MED ORDER — PEN NEEDLES 30G X 8 MM MISC: 1.0000 | 99 refills | Status: AC

## 2020-02-16 MED ORDER — NOVOLOG FLEXPEN 100 UNIT/ML ~~LOC~~ SOPN: 15.0000 [IU] | PEN_INJECTOR | Freq: Three times a day (TID) | SUBCUTANEOUS | 11 refills | Status: AC

## 2020-02-16 MED ORDER — LANTUS SOLOSTAR 100 UNIT/ML ~~LOC~~ SOPN: 10.0000 [IU] | PEN_INJECTOR | Freq: Every day | SUBCUTANEOUS | 11 refills | Status: DC

## 2020-02-16 MED ORDER — METOCLOPRAMIDE HCL 10 MG PO TABS: 5.0000 mg | ORAL_TABLET | ORAL | 3 refills | Status: DC | PRN

## 2020-02-16 NOTE — Telephone Encounter (Signed)
Patient came in to the office wanting to be seen in person by PCP today, states they are having symptoms of diarrhea, vomiting, can't keep food down, states that patient is a diabetic as well, and this has been ongoing symptoms for a week or two now, also states patient had TWO COVID tests and both came back NEGATIVE. Appointment has been made for 1 PM with PCP, but is this appointment OK to be in office or kept virtual for now? Please Advise AM

## 2020-02-16 NOTE — Patient Instructions (Signed)
For Diabetes  Continue Metformin as you are taking it  Measure fasting blood sugar every day: goal for now is to get this to 80-130  Start w/ 15 units insulin daily  Increase daily Insulin dose by 3 units at a time, every 3 days, until fasting sugars are consistently 80-130, then continue at that dose  Plan to recheck A1C in 3 months  Plan to follow-up in the office sooner if you experience low sugars   Plan to follow-up in the office sooner if your fasting sugars are consistently high  Bring all sugar readings with you to your office visits

## 2020-02-16 NOTE — Progress Notes (Signed)
Melvin [name] is a 59 y.o. male who presents to  [place] at [facility]  today, 02/16/20, seeking care for the following:  N/V  Fatigue  Weight loss     ASSESSMENT & PLAN with other pertinent history/findings:  The primary encounter diagnosis was Nausea and vomiting, intractability of vomiting not specified, unspecified vomiting type. Diagnoses of Other fatigue, Weight loss, and Type 2 diabetes mellitus with diabetic polyneuropathy, without long-term current use of insulin ([place]) were also pertinent to this visit.   Lack of insurance is barrier to care ER precautions reviewed DKA/HHNK Will call in AM to check on him   Tx Insulin NOW no longer a negotiation Discussed starting basal/bolus insulin  Lantus to titrate up    Sample of Lantus provided today  Novolog sent to take 10-15 units at mealtimes   No short-acting insulin at clinic, Rx sent  Paperwork provided previously for Rx assistance, pt has at home Aggressive po hydration    02/17/20  See labs below Sodium corrects to WNL  AGap mildly elevated  (+) urine ketones trace ALP elevated likely inflammatory?  Renal function thankfully ok      Patient Instructions  For Diabetes  Continue Metformin as you are taking it  Measure fasting blood sugar every day: goal for now is to get this to 80-130  Start w/ 15 units insulin daily  Increase daily Insulin dose by 3 units at a time, every 3 days, until fasting sugars are consistently 80-130, then continue at that dose  Plan to recheck A1C in 3 months  Plan to follow-up in the office sooner if you experience low sugars   Plan to follow-up in the office sooner if your fasting sugars are consistently high  Bring all sugar readings with you to your office visits    Results for orders placed or performed in visit on 02/16/20 (from the past 24 hour(s))  POCT urinalysis dipstick     Status: Abnormal   Collection Time:  02/16/20  1:33 PM  Result Value Ref Range   Color, UA yellow    Clarity, UA clear    Glucose, UA Positive (A) Negative   Bilirubin, UA negative    Ketones, UA trace    Spec Grav, UA 1.015 1.010 - 1.025   Blood, UA negative    pH, UA 6.0 5.0 - 8.0   Protein, UA Negative Negative   Urobilinogen, UA 4.0 (A) 0.2 or 1.0 E.U./dL   Nitrite, UA negative    Leukocytes, UA Negative Negative   Appearance     Odor    POCT UA - Microalbumin     Status: Abnormal   Collection Time: 02/16/20  1:34 PM  Result Value Ref Range   Microalbumin Ur, POC 30 mg/L   Creatinine, POC 100 mg/dL   Albumin/Creatinine Ratio, Urine, POC 30-300   POCT glucose (manual entry)     Status: Abnormal   Collection Time: 02/16/20  1:34 PM  Result Value Ref Range   POC Glucose >440 70 - 99 mg/dl  CBC     Status: None   Collection Time: 02/16/20  1:42 PM  Result Value Ref Range   WBC 9.8 3.8 - 10.8 Thousand/uL   RBC 4.42 4.20 - 5.80 Million/uL   Hemoglobin 13.2 13.2 - 17.1 g/dL   HCT 39.9 38.5 - 50.0 %   MCV 90.3 80.0 - 100.0 fL   MCH 29.9 27.0 - 33.0 pg   MCHC 33.1 32.0 -  36.0 g/dL   RDW 12.0 11.0 - 15.0 %   Platelets 270 140 - 400 Thousand/uL   MPV 11.9 7.5 - 12.5 fL  COMPLETE METABOLIC PANEL WITH GFR     Status: Abnormal   Collection Time: 02/16/20  1:42 PM  Result Value Ref Range   Glucose, Bld 390 (H) 65 - 99 mg/dL   BUN 17 7 - 25 mg/dL   Creat 0.68 (L) 0.70 - 1.33 mg/dL   GFR, Est Non African American 106 > OR = 60 mL/min/1.38m2   GFR, Est African American 123 > OR = 60 mL/min/1.71m2   BUN/Creatinine Ratio 25 (H) 6 - 22 (calc)   Sodium 133 (L) 135 - 146 mmol/L   Potassium 4.8 3.5 - 5.3 mmol/L   Chloride 96 (L) 98 - 110 mmol/L   CO2 27 20 - 32 mmol/L   Calcium 9.1 8.6 - 10.3 mg/dL   Total Protein 7.0 6.1 - 8.1 g/dL   Albumin 3.6 3.6 - 5.1 g/dL   Globulin 3.4 1.9 - 3.7 g/dL (calc)   AG Ratio 1.1 1.0 - 2.5 (calc)   Total Bilirubin 0.7 0.2 - 1.2 mg/dL   Alkaline phosphatase (APISO) 740 (H) 35 - 144  U/L   AST 42 (H) 10 - 35 U/L   ALT 72 (H) 9 - 46 U/L  Hemoglobin A1c     Status: Abnormal   Collection Time: 02/16/20  1:42 PM  Result Value Ref Range   Hgb A1c MFr Bld >14.0 (H) <5.7 % of total Hgb   Mean Plasma Glucose  (calc)    Orders Placed This Encounter  Procedures  . CBC  . COMPLETE METABOLIC PANEL WITH GFR  . Hemoglobin A1c  . POCT UA - Microalbumin  . POCT urinalysis dipstick  . POCT glucose (manual entry)    Meds ordered this encounter  Medications  . metoCLOPramide (REGLAN) 10 MG tablet    Sig: Take 0.5 tablets (5 mg total) by mouth as needed for nausea (nausea/reflux).    Dispense:  30 tablet    Refill:  3  . Insulin Glargine (LANTUS SOLOSTAR) 100 UNIT/ML Solostar Pen    Sig: Inject 10-60 Units into the skin at bedtime. Please include QS 3 months w/ #100 pen needles.    Dispense:  1 pen    Refill:  11  . insulin aspart (NOVOLOG FLEXPEN) 100 UNIT/ML FlexPen    Sig: Inject 15 Units into the skin 3 (three) times daily with meals.    Dispense:  15 mL    Refill:  11  . Insulin Pen Needle (PEN NEEDLES) 30G X 8 MM MISC    Sig: 1 each by Does not apply route as directed.    Dispense:  200 each    Refill:  99       Follow-up instructions: Return in about 2 weeks (around 03/01/2020) for VIRTUAL VISIT follow up diabetes .                                         BP 138/89   Pulse 98   Temp 98.1 F (36.7 C) (Oral)   Wt 235 lb (106.6 kg)   SpO2 98% Comment: on RA  BMI 29.37 kg/m   Current Meds  Medication Sig  . atorvastatin (LIPITOR) 10 MG tablet Take 1 tablet (10 mg total) by mouth daily.  Marland Kitchen gabapentin (NEURONTIN) 300 MG  capsule TAKE ONE CAPSULE BY MOUTH THREE TIMES A DAY  . glimepiride (AMARYL) 4 MG tablet Take 1 tablet (4 mg total) by mouth daily before breakfast.  . losartan (COZAAR) 25 MG tablet Take 1 tablet (25 mg total) by mouth daily.  . metFORMIN (GLUCOPHAGE) 1000 MG tablet Take 1 tablet (1,000 mg total) by  mouth 2 (two) times daily with a meal.    No results found for this or any previous visit (from the past 72 hour(s)).  No results found.  No flowsheet data found.  No flowsheet data found.    All questions at time of visit were answered - patient instructed to contact office with any additional concerns or updates.  ER/RTC precautions were reviewed with the patient.  Please note: voice recognition software was used to produce this document, and typos may escape review. Please contact Dr. Sheppard Coil for any needed clarifications.   Total encounter time: 40 minutes.

## 2020-02-17 LAB — COMPLETE METABOLIC PANEL WITH GFR
AG Ratio: 1.1 (calc) (ref 1.0–2.5)
ALT: 72 U/L — ABNORMAL HIGH (ref 9–46)
AST: 42 U/L — ABNORMAL HIGH (ref 10–35)
Albumin: 3.6 g/dL (ref 3.6–5.1)
Alkaline phosphatase (APISO): 740 U/L — ABNORMAL HIGH (ref 35–144)
BUN/Creatinine Ratio: 25 (calc) — ABNORMAL HIGH (ref 6–22)
BUN: 17 mg/dL (ref 7–25)
CO2: 27 mmol/L (ref 20–32)
Calcium: 9.1 mg/dL (ref 8.6–10.3)
Chloride: 96 mmol/L — ABNORMAL LOW (ref 98–110)
Creat: 0.68 mg/dL — ABNORMAL LOW (ref 0.70–1.33)
GFR, Est African American: 123 mL/min/{1.73_m2} (ref >=60)
GFR, Est Non African American: 106 mL/min/{1.73_m2} (ref >=60)
Globulin: 3.4 g/dL (calc) (ref 1.9–3.7)
Glucose, Bld: 390 mg/dL — ABNORMAL HIGH (ref 65–99)
Potassium: 4.8 mmol/L (ref 3.5–5.3)
Sodium: 133 mmol/L — ABNORMAL LOW (ref 135–146)
Total Bilirubin: 0.7 mg/dL (ref 0.2–1.2)
Total Protein: 7 g/dL (ref 6.1–8.1)

## 2020-02-17 LAB — CBC
HCT: 39.9 % (ref 38.5–50.0)
Hemoglobin: 13.2 g/dL (ref 13.2–17.1)
MCH: 29.9 pg (ref 27.0–33.0)
MCHC: 33.1 g/dL (ref 32.0–36.0)
MCV: 90.3 fL (ref 80.0–100.0)
MPV: 11.9 fL (ref 7.5–12.5)
Platelets: 270 10*3/uL (ref 140–400)
RBC: 4.42 10*6/uL (ref 4.20–5.80)
RDW: 12 % (ref 11.0–15.0)
WBC: 9.8 10*3/uL (ref 3.8–10.8)

## 2020-02-17 LAB — HEMOGLOBIN A1C: Hgb A1c MFr Bld: >14 % of total Hgb — ABNORMAL HIGH (ref <5.7)

## 2020-02-23 ENCOUNTER — Ambulatory Visit: Payer: Self-pay | Admitting: Nurse Practitioner

## 2020-02-24 ENCOUNTER — Encounter: Payer: Self-pay | Admitting: Nurse Practitioner

## 2020-02-24 ENCOUNTER — Ambulatory Visit: Payer: Self-pay | Admitting: Medical-Surgical

## 2020-02-24 ENCOUNTER — Telehealth (INDEPENDENT_AMBULATORY_CARE_PROVIDER_SITE_OTHER): Payer: PRIVATE HEALTH INSURANCE | Admitting: Nurse Practitioner

## 2020-02-24 DIAGNOSIS — E1142 Type 2 diabetes mellitus with diabetic polyneuropathy: Secondary | ICD-10-CM

## 2020-02-24 NOTE — Patient Instructions (Signed)
I want you to increase your Novolog insulin to 15 units with every meal.   *  Please check your blood sugar as soon as you wake up in the morning (before you have eaten anything) AND 1 hour after every meal for the next 3 days.   *  Call the office tomorrow [phone]) and select "triage" and let the nurse know what your blood sugars have been running and she will get the information to me. I will call you and let you know what changes need to be made, if any.   *  Bring the other blood sugar numbers with you to your appointment with Dr. Sheppard Coil.   I want you to increase your Lantus insulin to 25 units before bed tonight.   Watch the foods you are eating- try to avoid foods that are high in sugar, including natural sugars that are found in fruit and potatoes.   If you experience any weird symptoms, check your blood sugar immediately.   *If your sugar is high- call the office and let us know. We may need to add an extra dose of insulin.  *If you sugar is less than 70, you need to eat or drink something with sugar in it (4 oz of orange juice is good) and something with protein (peanut butter crackers are good) and then recheck your blood sugar in 15-20 minutes.   If your blood sugar is low two times in a row, decrease the Novolog insulin with your meals down to 12 units per dose and let us know.   You may have to check your sugars frequently for the first week or so while we get this adjusted, but once we find the right dose you won't have to check as often. We will work together to get this under control.

## 2020-02-24 NOTE — Progress Notes (Addendum)
Virtual Visit via MyChart Note  I connected with  Melvin [name] on 02/24/20 at  8:50 AM EST by the video enabled telemedicine application, MyChart, and verified that I am speaking with the correct person using two identifiers.   I introduced myself as a Designer, jewellery with the practice. We discussed the limitations of evaluation and management by telemedicine and the availability of in person appointments. The patient expressed understanding and agreed to proceed.  The patient is: At home  I am: In the office  Subjective:    CC: Decreased energy and abdominal pain yesterday.  HPI: Melvin [name] is a 59 y.o. y/o male presenting via [place] today for symptoms of decreased energy, shortness of breath, abdominal pain, weakness, "hunched over" and unable to stand up straight yesterday morning.  Melvin [name] reports he went back to work yesterday morning after being started on NovoLog and Lantus last week for significantly elevated blood sugars.  He reports that he was doing very well at home and adjusting to the medication fine.  He reports that yesterday morning he took 10 units of NovoLog insulin and ate breakfast and went to work .  He reports by about 830 the symptoms began.  At 9 AM he had a break and ate a Nutrigrain bar and a cup of applesauce.  At that time he thought it could possibly be that his sugar was low and so he also ate a piece of chocolate.  He reports that his symptoms did not improve at all and actually worsened to the point that he had to leave work at approximately 920 yesterday morning. He did not have his blood glucose monitor with him at work and so he was unable to check his blood sugars at that time.  He does report that yesterday afternoon he took an additional 10 units of NovoLog and ate lunch.  His postprandial blood glucose was 268.  He has been unable to check his blood sugar any further, however, because he received a new blood glucose monitor and forgot to purchase the  strips with it.  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  He reports he was having similar symptoms when he came to see his PCP on 02/16/2020.   Past medical history, Surgical history, Family history not pertinant except as noted below, Social history, Allergies, and medications have been entered into the medical record, reviewed, and corrections made.   Review of Systems:  General: No fevers, chills, lightheadedness, dizziness. Endorses fatigue, weakness during episodes. Neuro: Endorses headache during episode.   HEENT: Denies rhinorrhea, epistaxis, loss of taste, loss of smell Pulmonary/CV: Endorses nonproductive cough, and shortness of breath during episodes.   GI: Endorses abdominal pain and nausea during episodes  Denies vomiting, constipation Endocrine: Denies polydipsia, polyphagia Endorses polyuria  Objective:    General: Speaking clearly in complete sentences without any shortness of breath.  Alert and oriented x3.  Normal judgment. No apparent acute distress.   Impression and Recommendations:    1. Type 2 diabetes mellitus with diabetic polyneuropathy, without long-term current use of insulin (HCC) Strongly suspect that the patient's symptoms are related to elevated blood glucose levels and [name] of insulin sufficient to utilize blood glucose for cellular energy.  At this time it is unclear with the patient's fasting blood Leukos levels are running and we do not have a consistent measure of postprandial blood glucose levels. Encouraged the patient to obtain glucose strips for his meter as soon as possible today  and discussed the importance of frequent measuring of blood sugar while adjusting insulin therapy. Instructed the patient to increase NovoLog to 15 units with meals and monitor postprandial blood glucose levels 1 hour after eating.  Instructed the patient to increase Lantus to 25 units at bedtime and monitor fasting blood sugar in the  morning. Patient is to notify the office of his afternoon and evening blood sugars today in the morning and afternoon sugars tomorrow by tomorrow afternoon.  Plan to make adjustments to insulin dosing as necessary based on these blood sugar levels. Education provided on what to do if blood glucose levels fall below 70 and how to adjust insulin doses if blood glucose levels remain low after 2 checks. Encouraged the patient to bring all of the blood glucose monitoring information with him to his appointment with Dr. Sheppard Coil on Friday. Note provided for patient to stay out of work for the remainder of the week in order to get his blood glucose under control. Discussed the seriousness of elevated blood glucose levels and the effect that this is having on the body. I am hopeful that we can help get him under control soon.    I discussed the assessment and treatment plan with the patient. The patient was provided an opportunity to ask questions and all were answered. The patient agreed with the plan and demonstrated an understanding of the instructions.   The patient was advised to call back or seek an in-person evaluation if the symptoms worsen or if the condition fails to improve as anticipated.  I provided 25 minutes of non-face-to-face interaction with this [place] visit.   Orma Render, NP

## 2020-02-27 ENCOUNTER — Ambulatory Visit (INDEPENDENT_AMBULATORY_CARE_PROVIDER_SITE_OTHER): Payer: PRIVATE HEALTH INSURANCE | Admitting: Osteopathic Medicine

## 2020-02-27 ENCOUNTER — Encounter: Payer: Self-pay | Admitting: Osteopathic Medicine

## 2020-02-27 ENCOUNTER — Other Ambulatory Visit: Payer: Self-pay

## 2020-02-27 VITALS — BP 125/84 | HR 109 | Temp 98.1°F | Wt 234.0 lb

## 2020-02-27 DIAGNOSIS — J189 Pneumonia, unspecified organism: Secondary | ICD-10-CM | POA: Diagnosis not present

## 2020-02-27 DIAGNOSIS — Z794 Long term (current) use of insulin: Secondary | ICD-10-CM

## 2020-02-27 DIAGNOSIS — E1169 Type 2 diabetes mellitus with other specified complication: Secondary | ICD-10-CM

## 2020-02-27 MED ORDER — AMOXICILLIN-POT CLAVULANATE 875-125 MG PO TABS: 1.0000 | ORAL_TABLET | Freq: Two times a day (BID) | ORAL | 0 refills | Status: DC

## 2020-02-27 MED ORDER — AZITHROMYCIN 250 MG PO TABS: ORAL_TABLET | ORAL | 0 refills | Status: DC

## 2020-02-27 MED ORDER — TOUJEO MAX SOLOSTAR 300 UNIT/ML ~~LOC~~ SOPN: 25.0000 [IU] | PEN_INJECTOR | Freq: Every day | SUBCUTANEOUS | 0 refills | Status: DC

## 2020-02-27 MED ORDER — GUAIFENESIN-CODEINE 100-10 MG/5ML PO SYRP: 5.0000 mL | ORAL_SOLUTION | Freq: Three times a day (TID) | ORAL | 0 refills | Status: DC | PRN

## 2020-02-27 NOTE — Progress Notes (Signed)
Melvin Conkin is a 59 y.o. male who presents to  [place] at [facility]  today, 02/27/20, seeking care for the following: . DM2     ASSESSMENT & PLAN with other pertinent history/findings:  The primary encounter diagnosis was Type 2 diabetes mellitus with other specified complication, with long-term current use of insulin ([place]). A diagnosis of Community acquired pneumonia of left lower lobe of lung was also pertinent to this visit.   Lantus titration up Currently at 25 units  Cost is an issue   Novolog at mealtimes 20 units  Doing well   Glc trending down Still issues w/ coughing, abdominal pain epigastric  On PE, faint crackling in LLL  Sample provided for Toujeo  Lot [id number] Exp: 12/17/2021    Patient Instructions   Plan:  Insulin change - Toujeo! See below. Measure FASTING sugars, keep increasing Toujeo insulin by 5 units at a time, twice per week, until fasting sigars consistently 100-150. Continue the Novolog at 20 units before meals. I'll work on samples / figure out what happened w/ the financial assistance forms.   I'm concerned about a pneumonia, I prescribed antibiotics and cough medicine. See below.    Meds ordered this encounter  Medications  . insulin glargine, 2 Unit Dial, (TOUJEO MAX SOLOSTAR) 300 UNIT/ML Solostar Pen    Sig: Inject 25-100 Units into the skin daily. Increase by 5 units at a time, twice per week, to fasting blood sugar 100-150    Dispense:  1 pen    Refill:  0  . azithromycin (ZITHROMAX) 250 MG tablet    Sig: 2 tabs po x1 on Day 1, then 1 tab po daily on Days 2 - 5    Dispense:  6 tablet    Refill:  0  . amoxicillin-clavulanate (AUGMENTIN) 875-125 MG tablet    Sig: Take 1 tablet by mouth 2 (two) times daily for 10 days.    Dispense:  20 tablet    Refill:  0  . guaiFENesin-codeine (ROBITUSSIN AC) 100-10 MG/5ML syrup    Sig: Take 5-10 mLs by mouth 3 (three) times daily as needed for cough.     Dispense:  180 mL    Refill:  0       No orders of the defined types were placed in this encounter.   Meds ordered this encounter  Medications  . insulin glargine, 2 Unit Dial, (TOUJEO MAX SOLOSTAR) 300 UNIT/ML Solostar Pen    Sig: Inject 25-100 Units into the skin daily. Increase by 5 units at a time, twice per week, to fasting blood sugar 100-150    Dispense:  1 pen    Refill:  0  . azithromycin (ZITHROMAX) 250 MG tablet    Sig: 2 tabs po x1 on Day 1, then 1 tab po daily on Days 2 - 5    Dispense:  6 tablet    Refill:  0  . amoxicillin-clavulanate (AUGMENTIN) 875-125 MG tablet    Sig: Take 1 tablet by mouth 2 (two) times daily for 10 days.    Dispense:  20 tablet    Refill:  0  . guaiFENesin-codeine (ROBITUSSIN AC) 100-10 MG/5ML syrup    Sig: Take 5-10 mLs by mouth 3 (three) times daily as needed for cough.    Dispense:  180 mL    Refill:  0       Follow-up instructions: Return in about 3 months (around 05/29/2020) for IN-OFFICE VISIT recheck A1C - see  [place] sooner if needed! .                                         BP 125/84 (BP Location: Right Arm, Patient Position: Sitting, Cuff Size: Normal)   Pulse (!) 109   Temp 98.1 F (36.7 C) (Oral)   Wt 234 lb 0.6 oz (106.2 kg)   BMI 29.25 kg/m   Current Meds  Medication Sig  . atorvastatin (LIPITOR) 10 MG tablet Take 1 tablet (10 mg total) by mouth daily.  Marland Kitchen gabapentin (NEURONTIN) 300 MG capsule TAKE ONE CAPSULE BY MOUTH THREE TIMES A DAY  . insulin aspart (NOVOLOG FLEXPEN) 100 UNIT/ML FlexPen Inject 15 Units into the skin 3 (three) times daily with meals.  . Insulin Glargine (LANTUS SOLOSTAR) 100 UNIT/ML Solostar Pen Inject 10-60 Units into the skin at bedtime. Please include QS 3 months w/ #100 pen needles.  . Insulin Pen Needle (PEN NEEDLES) 30G X 8 MM MISC 1 each by Does not apply route as directed.  Marland Kitchen losartan (COZAAR) 25 MG tablet Take 1 tablet (25 mg total) by mouth daily.   . metFORMIN (GLUCOPHAGE) 1000 MG tablet Take 1 tablet (1,000 mg total) by mouth 2 (two) times daily with a meal.  . metoCLOPramide (REGLAN) 10 MG tablet Take 0.5 tablets (5 mg total) by mouth as needed for nausea (nausea/reflux).    No results found for this or any previous visit (from the past 72 hour(s)).  No results found.  No flowsheet data found.  No flowsheet data found.    All questions at time of visit were answered - patient instructed to contact office with any additional concerns or updates.  ER/RTC precautions were reviewed with the patient.  Please note: voice recognition software was used to produce this document, and typos may escape review. Please contact Dr. Sheppard Coil for any needed clarifications.

## 2020-02-27 NOTE — Patient Instructions (Addendum)
Plan:  Insulin change - Toujeo! See below. Measure FASTING sugars, keep increasing Toujeo insulin by 5 units at a time, twice per week, until fasting sigars consistently 100-150. Continue the Novolog at 20 units before meals. I'll work on samples / figure out what happened w/ the financial assistance forms.   I'm concerned about a pneumonia, I prescribed antibiotics and cough medicine. See below.    Meds ordered this encounter  Medications  . insulin glargine, 2 Unit Dial, (TOUJEO MAX SOLOSTAR) 300 UNIT/ML Solostar Pen    Sig: Inject 25-100 Units into the skin daily. Increase by 5 units at a time, twice per week, to fasting blood sugar 100-150    Dispense:  1 pen    Refill:  0  . azithromycin (ZITHROMAX) 250 MG tablet    Sig: 2 tabs po x1 on Day 1, then 1 tab po daily on Days 2 - 5    Dispense:  6 tablet    Refill:  0  . amoxicillin-clavulanate (AUGMENTIN) 875-125 MG tablet    Sig: Take 1 tablet by mouth 2 (two) times daily for 10 days.    Dispense:  20 tablet    Refill:  0  . guaiFENesin-codeine (ROBITUSSIN AC) 100-10 MG/5ML syrup    Sig: Take 5-10 mLs by mouth 3 (three) times daily as needed for cough.    Dispense:  180 mL    Refill:  0

## 2020-03-05 ENCOUNTER — Encounter: Payer: Self-pay | Admitting: Osteopathic Medicine

## 2020-03-05 ENCOUNTER — Telehealth (INDEPENDENT_AMBULATORY_CARE_PROVIDER_SITE_OTHER): Payer: PRIVATE HEALTH INSURANCE | Admitting: Osteopathic Medicine

## 2020-03-05 VITALS — Temp 98.5°F | Wt 234.0 lb

## 2020-03-05 DIAGNOSIS — R1084 Generalized abdominal pain: Secondary | ICD-10-CM | POA: Diagnosis not present

## 2020-03-05 DIAGNOSIS — E162 Hypoglycemia, unspecified: Secondary | ICD-10-CM

## 2020-03-05 NOTE — Progress Notes (Signed)
Virtual Visit via Phone   I connected with      Melvin [name] on 03/05/20 at 10:59 AM  by a telemedicine application and verified that I am speaking with the correct person using two identifiers.  Patient is at home I am in office   I discussed the limitations of evaluation and management by telemedicine and the availability of in person appointments. The patient expressed understanding and agreed to proceed.  History of Present Illness: Melvin [name] is a 59 y.o. male who would like to discuss abdominal pain, fluctuating blood glucose.   Abdominal pain past few days. Described as discomfort more so than pain per patient.  Generalized, migrating through abdomen. Temp 100.9 yesterday Vomited this AM Normal bowel movements, no diarrhea/constipation Pain not relieved by voiding  Blood sugars also fluctuating 137 fasting this AM fasting sugars pretty stable and no morning hypoglycemia Was 50 yesterday about 1 hour after lunchtime  Similar hypoglycemia after meals a few times Still taking the Novolog at 20 units mealtimes Usually 104, 130, 150 about an hour after lunch  Not eating much d/t abd discomfort      Observations/Objective: Temp 98.5 F (36.9 C) (Oral)   Wt 234 lb (106.1 kg)   BMI 29.25 kg/m  BP Readings from Last 3 Encounters:  02/27/20 125/84  02/16/20 138/89  02/08/20 105/72   Exam: Normal Speech.  NAD  Lab and Radiology Results No results found for this or any previous visit (from the past 72 hour(s)). No results found.     Assessment and Plan: 59 y.o. male with The primary encounter diagnosis was Hypoglycemia. A diagnosis of Generalized abdominal pain was also pertinent to this visit.  Hypoglycemia seems most likely due to low appetite and short acting insulin dose too high.  Advised to reduce NovoLog to 10 to 15 units for now at mealtimes.  May consider sliding scale based on how he does.  Most likely cause of GI upset seems probably  antibiotics for pneumonia.  Patient has finished his azithromycin, almost done with the Augmentin, I advised him to just go ahead and tossed this medication, has had 7 days of treatment.  Still reporting some shortness of breath on exertion.  No orthopnea or lower extremity edema.  Fever is definitely concerning though.    Patient would like to avoid ER if at all possible due to financial issues.    ER precautions reviewed as follows: Worsening pain, development of severe nausea or vomiting, development of diarrhea especially bloody stool, return of fever  Patient agreeable to going to ER if he continues to feel poorly/worse over the weekend.  Otherwise, follow-up with me in office on Monday.  I offered patient an in office appointment today and he declines for now.    Follow Up Instructions: Return for recheck Monday in office at 10:00 if no better .    I discussed the assessment and treatment plan with the patient. The patient was provided an opportunity to ask questions and all were answered. The patient agreed with the plan and demonstrated an understanding of the instructions.   The patient was advised to call back or seek an in-person evaluation if any new concerns, if symptoms worsen or if the condition fails to improve as anticipated.  30 minutes of non-face-to-face time was provided during this encounter.      . . . . . . . . . . . . . Marland Kitchen  Historical information moved to improve visibility of documentation.  Past Medical History:  Diagnosis Date  . Diabetes mellitus without complication ([place])   . High cholesterol   . Hypertension    No past surgical history on file. Social History   Tobacco Use  . Smoking status: Never Smoker  . Smokeless tobacco: Never Used  Substance Use Topics  . Alcohol use: Not on file    Comment: occ   family history is not on file.  Medications: Current Outpatient Medications  Medication Sig  Dispense Refill  . atorvastatin (LIPITOR) 10 MG tablet Take 1 tablet (10 mg total) by mouth daily. 90 tablet 1  . gabapentin (NEURONTIN) 300 MG capsule TAKE ONE CAPSULE BY MOUTH THREE TIMES A DAY 90 capsule 0  . insulin aspart (NOVOLOG FLEXPEN) 100 UNIT/ML FlexPen Inject 15 Units into the skin 3 (three) times daily with meals. 15 mL 11  . insulin glargine, 2 Unit Dial, (TOUJEO MAX SOLOSTAR) 300 UNIT/ML Solostar Pen Inject 25-100 Units into the skin daily. Increase by 5 units at a time, twice per week, to fasting blood sugar 100-150 1 pen 0  . Insulin Pen Needle (PEN NEEDLES) 30G X 8 MM MISC 1 each by Does not apply route as directed. 200 each 99  . losartan (COZAAR) 25 MG tablet Take 1 tablet (25 mg total) by mouth daily. 90 tablet 1  . metFORMIN (GLUCOPHAGE) 1000 MG tablet Take 1 tablet (1,000 mg total) by mouth 2 (two) times daily with a meal. 180 tablet 1  . metoCLOPramide (REGLAN) 10 MG tablet Take 0.5 tablets (5 mg total) by mouth as needed for nausea (nausea/reflux). 30 tablet 3  . guaiFENesin-codeine (ROBITUSSIN AC) 100-10 MG/5ML syrup Take 5-10 mLs by mouth 3 (three) times daily as needed for cough. (Patient not taking: Reported on 03/05/2020) 180 mL 0   No current facility-administered medications for this visit.   No Known Allergies

## 2020-03-08 ENCOUNTER — Ambulatory Visit (INDEPENDENT_AMBULATORY_CARE_PROVIDER_SITE_OTHER): Payer: PRIVATE HEALTH INSURANCE

## 2020-03-08 ENCOUNTER — Other Ambulatory Visit: Payer: Self-pay

## 2020-03-08 ENCOUNTER — Ambulatory Visit (INDEPENDENT_AMBULATORY_CARE_PROVIDER_SITE_OTHER): Payer: PRIVATE HEALTH INSURANCE | Admitting: Osteopathic Medicine

## 2020-03-08 ENCOUNTER — Encounter: Payer: Self-pay | Admitting: Osteopathic Medicine

## 2020-03-08 VITALS — BP 132/88 | HR 108 | Temp 97.7°F | Ht 75.0 in | Wt 237.0 lb

## 2020-03-08 DIAGNOSIS — C799 Secondary malignant neoplasm of unspecified site: Secondary | ICD-10-CM

## 2020-03-08 DIAGNOSIS — R000 Tachycardia, unspecified: Secondary | ICD-10-CM | POA: Diagnosis not present

## 2020-03-08 DIAGNOSIS — E162 Hypoglycemia, unspecified: Secondary | ICD-10-CM

## 2020-03-08 DIAGNOSIS — R0602 Shortness of breath: Secondary | ICD-10-CM

## 2020-03-08 DIAGNOSIS — R5383 Other fatigue: Secondary | ICD-10-CM

## 2020-03-08 DIAGNOSIS — R1084 Generalized abdominal pain: Secondary | ICD-10-CM | POA: Diagnosis not present

## 2020-03-08 DIAGNOSIS — R109 Unspecified abdominal pain: Secondary | ICD-10-CM

## 2020-03-08 DIAGNOSIS — E1169 Type 2 diabetes mellitus with other specified complication: Secondary | ICD-10-CM | POA: Diagnosis not present

## 2020-03-08 DIAGNOSIS — R748 Abnormal levels of other serum enzymes: Secondary | ICD-10-CM

## 2020-03-08 DIAGNOSIS — R8290 Unspecified abnormal findings in urine: Secondary | ICD-10-CM

## 2020-03-08 DIAGNOSIS — Z794 Long term (current) use of insulin: Secondary | ICD-10-CM

## 2020-03-08 IMAGING — CT CT ABD-PELV W/ CM
2 of 5 series · 14 of 46 positions shown, 16 images · IV contrast (iopamidol)
Comparison: None.

CLINICAL DATA: Chest pain, generalized abdominal pain for 1 month.

EXAM:
CT ANGIOGRAPHY CHEST
CT ABDOMEN AND PELVIS WITH CONTRAST
TECHNIQUE: Multidetector CT imaging of the chest was performed using the
standard protocol during bolus administration of intravenous
contrast. Multiplanar CT image reconstructions and MIPs were
obtained to evaluate the vascular anatomy. Multidetector CT imaging
of the abdomen and pelvis was performed using the standard protocol
during bolus administration of intravenous contrast.
CONTRAST:  100mL [2L] IOPAMIDOL ([2L]) INJECTION 76%

[Series 5: abd/pelvis axial st · axial · 0.95mm/px · z∈[-799,-199]mm · 11 of 134 slices shown, 13 images]
[im 7/134  soft-tissue]
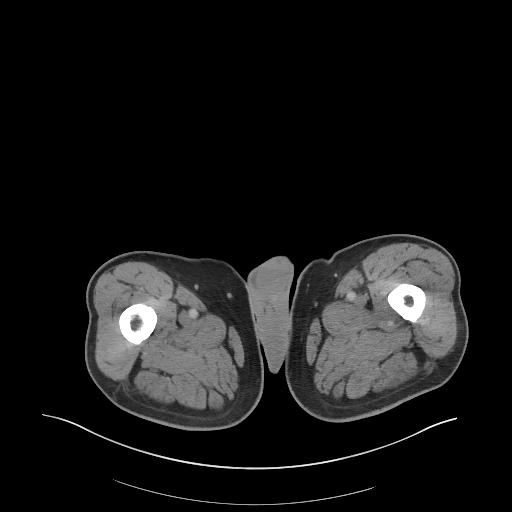
[im 7/134  bone]
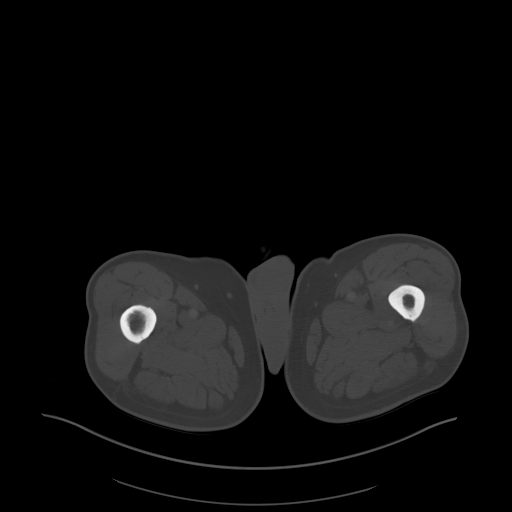
[im 20/134  soft-tissue]
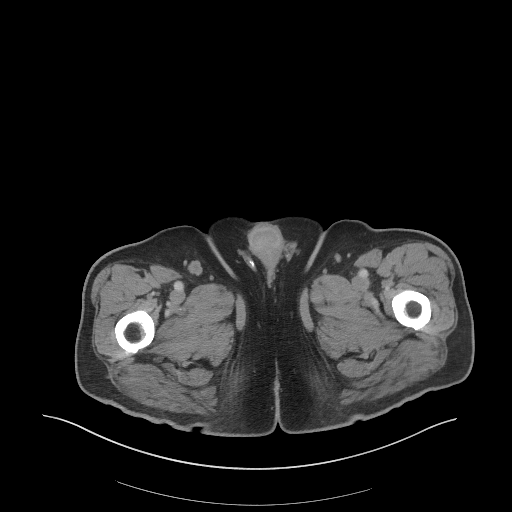
[im 34/134  soft-tissue]
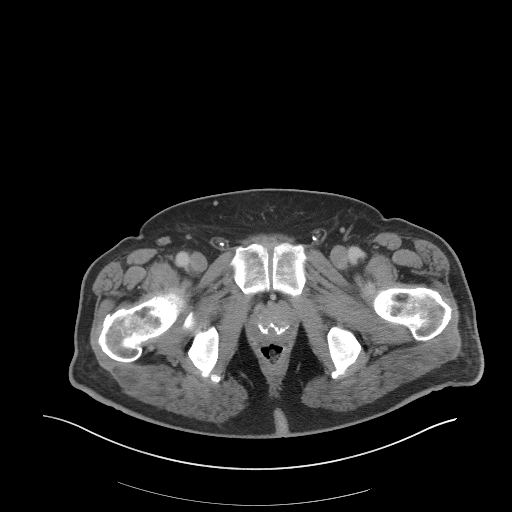
[im 47/134  soft-tissue]
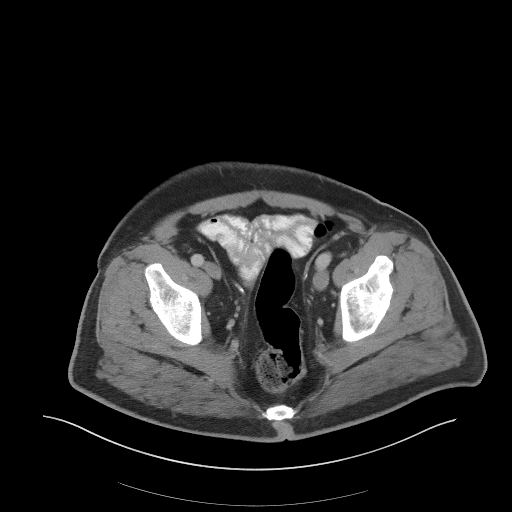
[im 54/134  soft-tissue]
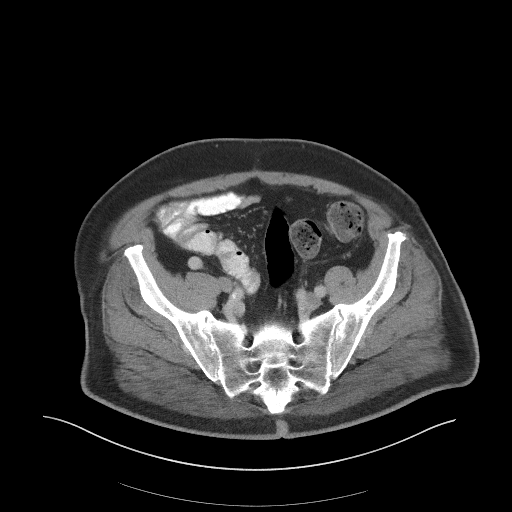
[im 67/134  soft-tissue]
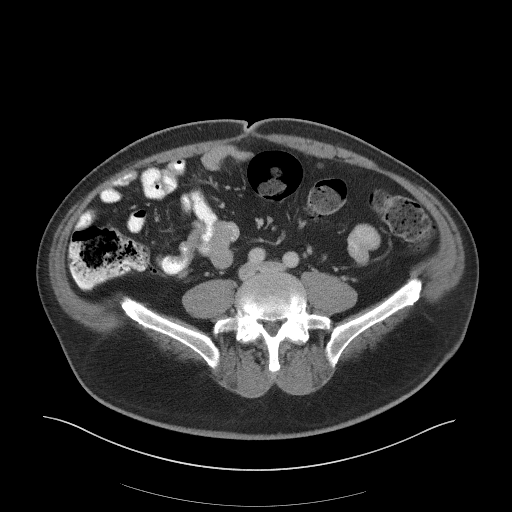
[im 80/134  soft-tissue]
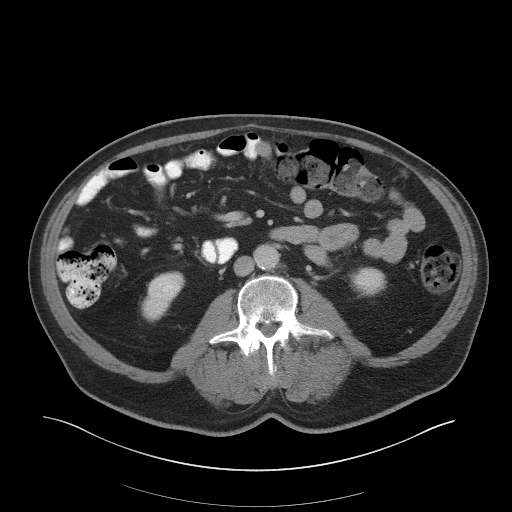
[im 87/134  soft-tissue]
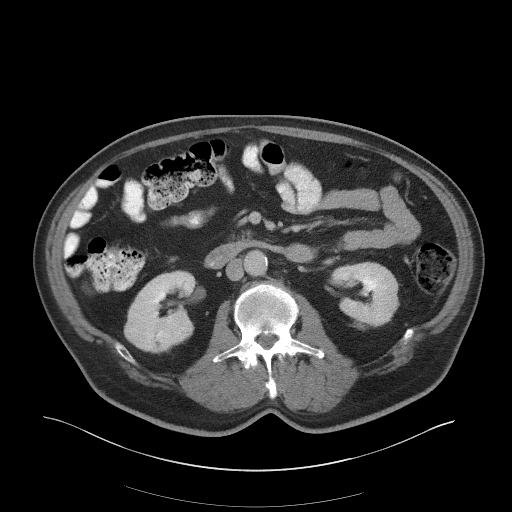
[im 100/134  soft-tissue]
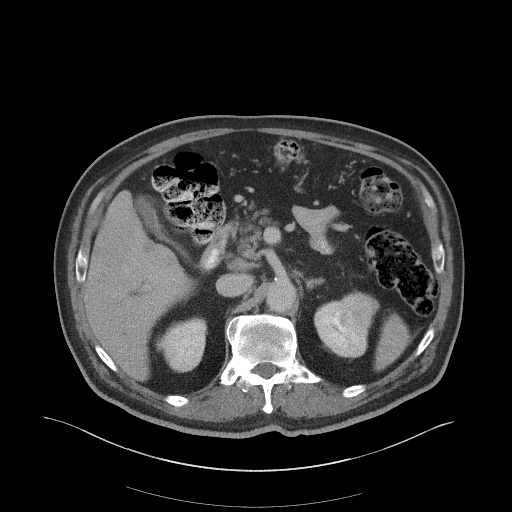
[im 100/134  bone]
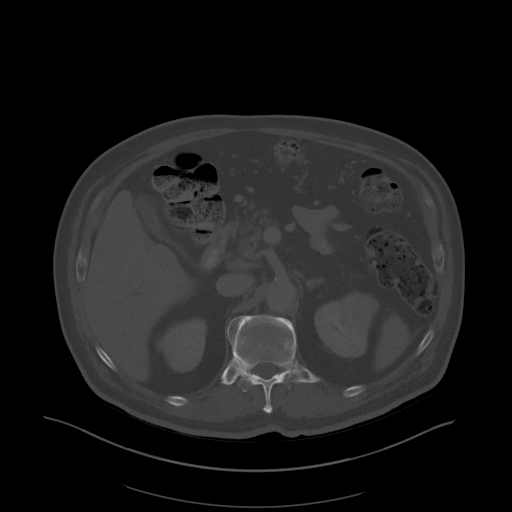
[im 114/134  soft-tissue]
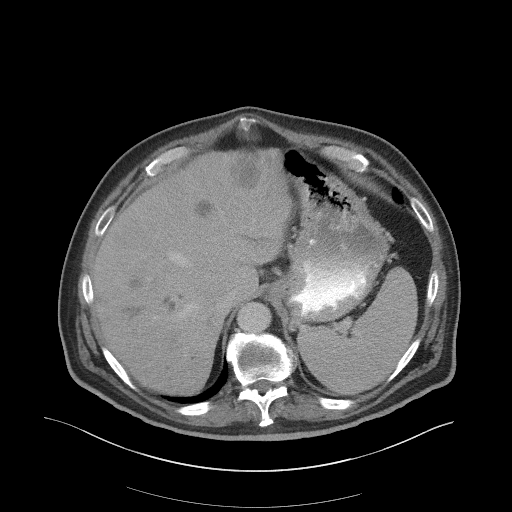
[im 127/134  soft-tissue]
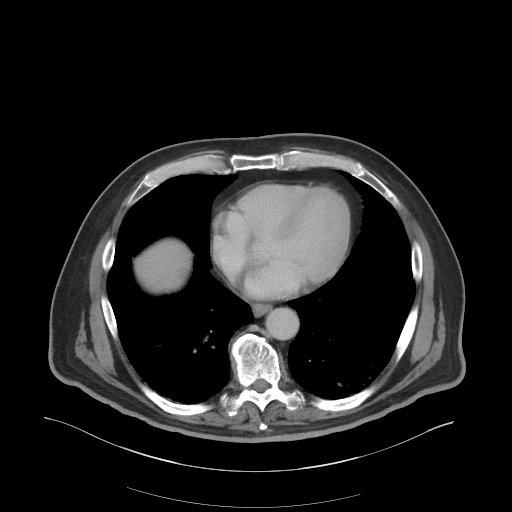

[Series 14: abd/pelvis coronal st · coronal · 0.91mm/px · 3 of 105 slices shown]
[im 35/105  soft-tissue]
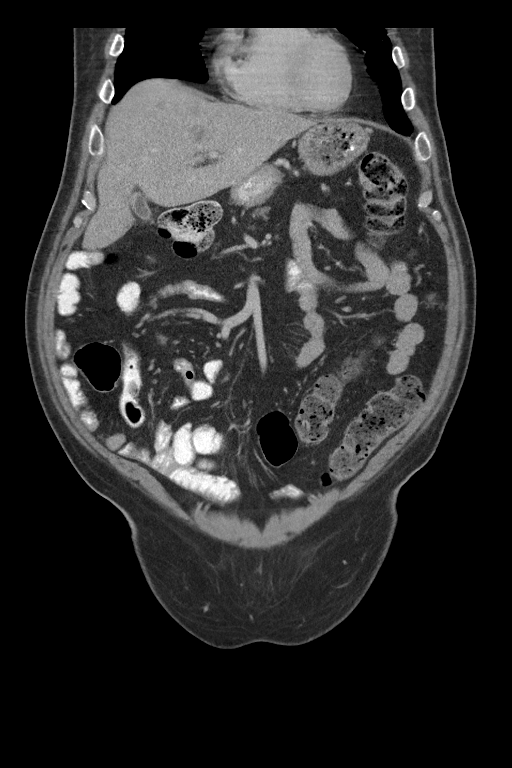
[im 47/105  soft-tissue]
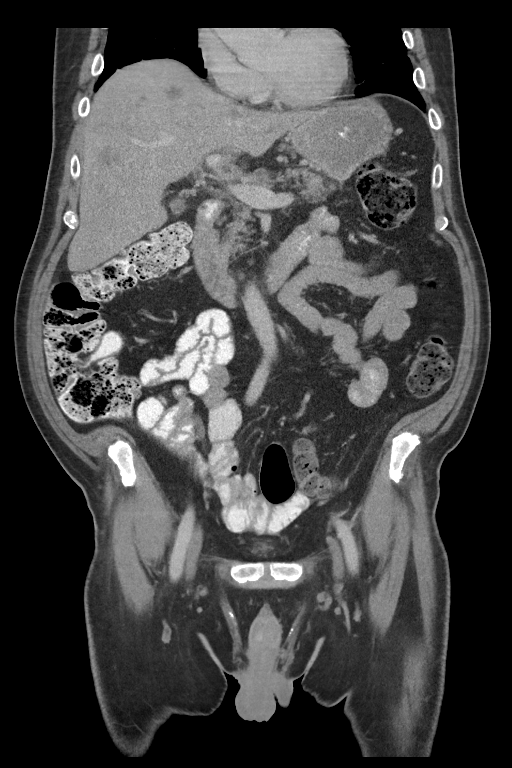
[im 58/105  soft-tissue]
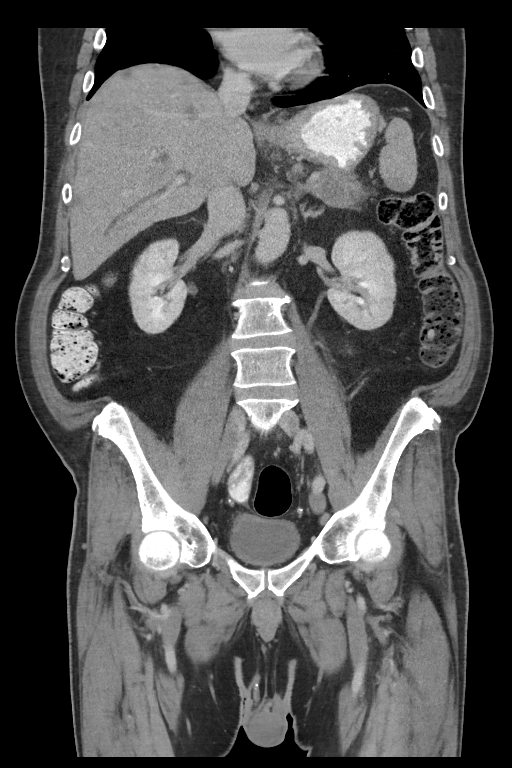

[14 of 46 positions shown; findings below may reference images not displayed]

FINDINGS: CTA CHEST FINDINGS

Cardiovascular: Satisfactory opacification of the pulmonary arteries
to the segmental level. No evidence of pulmonary embolism. Normal
heart size. No pericardial effusion.

Mediastinum/Nodes: No enlarged mediastinal, hilar, or axillary lymph
nodes. Thyroid gland, trachea, and esophagus demonstrate no
significant findings.

Lungs/Pleura: No pneumothorax or pleural effusion is noted.
Innumerable small nodules are noted throughout both lungs concerning
for diffuse metastatic disease or less likely atypical infection.

Musculoskeletal: No chest wall abnormality. No acute or significant
osseous findings.

Review of the MIP images confirms the above findings.

CT ABDOMEN and PELVIS FINDINGS

Hepatobiliary: No gallstones are noted. Mild right intrahepatic
biliary dilatation is noted. Multiple rounded low densities are
noted in the hepatic parenchyma consistent with metastatic disease.
The largest measures 3.3 cm in posterior segment of right hepatic
lobe.

Pancreas: Ill-defined mass measuring 8.3 x 3.0 cm is seen involving
pancreatic tail which extends into the splenic hilum, and this is
concerning for pancreatic malignancy. No ductal dilatation is noted.

Spleen: The spleen is self is unremarkable, although pancreatic mass
is seen extending into the hilum.

Adrenals/Urinary Tract: Adrenal glands are unremarkable. Kidneys are
normal, without renal calculi, focal lesion, or hydronephrosis.
Bladder is unremarkable.

Stomach/Bowel: Stomach is within normal limits. Appendix appears
normal. No evidence of bowel wall thickening, distention, or
inflammatory changes.

Vascular/Lymphatic: No significant vascular findings are present. No
enlarged abdominal or pelvic lymph nodes.

Reproductive: Prostate is unremarkable.

Other: No abdominal wall hernia or abnormality. No abdominopelvic
ascites.

Musculoskeletal: No acute or significant osseous findings.

Review of the MIP images confirms the above findings.
IMPRESSION: 1. No definite evidence of pulmonary embolus.
2. Innumerable small nodules are noted throughout both lungs
concerning for diffuse metastatic disease or less likely atypical
infection.
3. 8.3 x 3.0 cm ill-defined mass is seen involving pancreatic tail
which extends into the splenic hilum, and this is concerning for
pancreatic malignancy.
4. Multiple hepatic metastases are noted. Mild right intrahepatic
biliary dilatation is noted.

These results will be called to the ordering clinician or
representative by the Radiologist Assistant, and communication
documented in the PACS or zVision Dashboard.

## 2020-03-08 IMAGING — CT CT ANGIO CHEST
2 of 9 series · 17 of 36 positions shown · IV contrast (iopamidol)
Comparison: None.

CLINICAL DATA: Chest pain, generalized abdominal pain for 1 month.

EXAM:
CT ANGIOGRAPHY CHEST
CT ABDOMEN AND PELVIS WITH CONTRAST
TECHNIQUE: Multidetector CT imaging of the chest was performed using the
standard protocol during bolus administration of intravenous
contrast. Multiplanar CT image reconstructions and MIPs were
obtained to evaluate the vascular anatomy. Multidetector CT imaging
of the abdomen and pelvis was performed using the standard protocol
during bolus administration of intravenous contrast.
CONTRAST:  100mL [2L] IOPAMIDOL ([2L]) INJECTION 76%

[Series 7: pe thins · axial · 0.78mm/px · z∈[-305,-17]mm · 16 of 641 slices shown]
[im 33/641  lung]
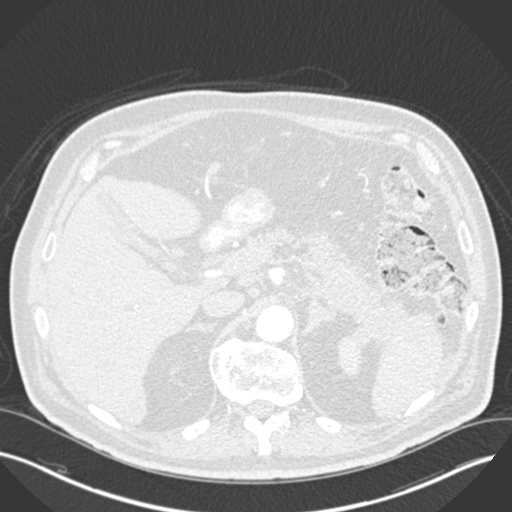
[im 65/641  mediastinal]
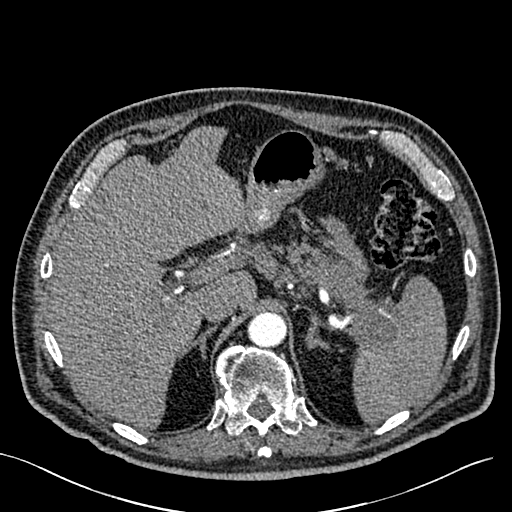
[im 97/641  lung]
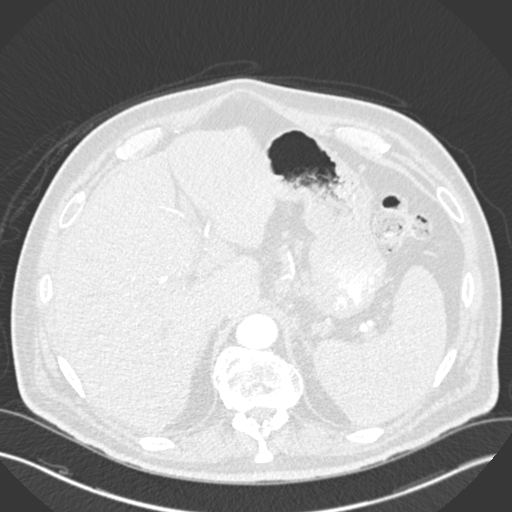
[im 161/641  mediastinal]
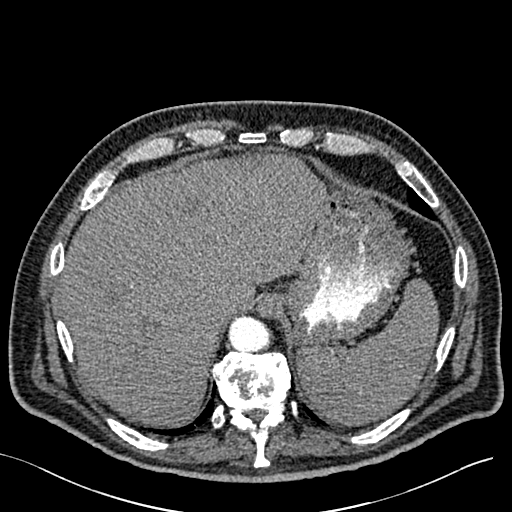
[im 193/641  lung]
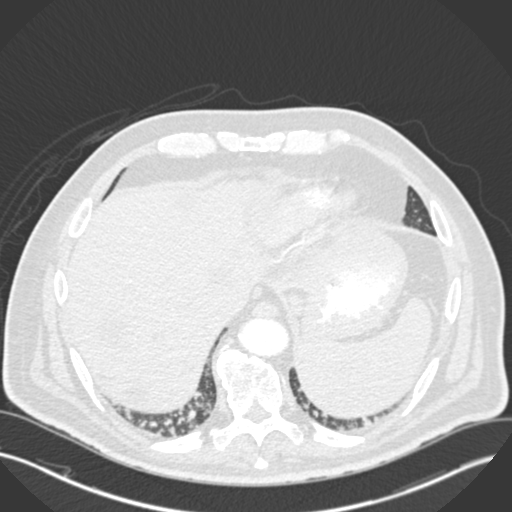
[im 225/641  mediastinal]
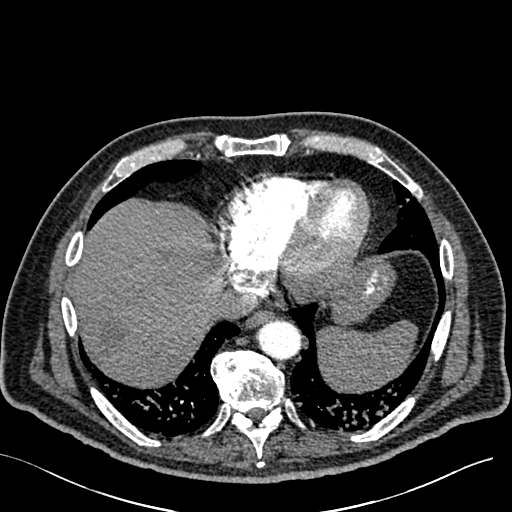
[im 257/641  lung]
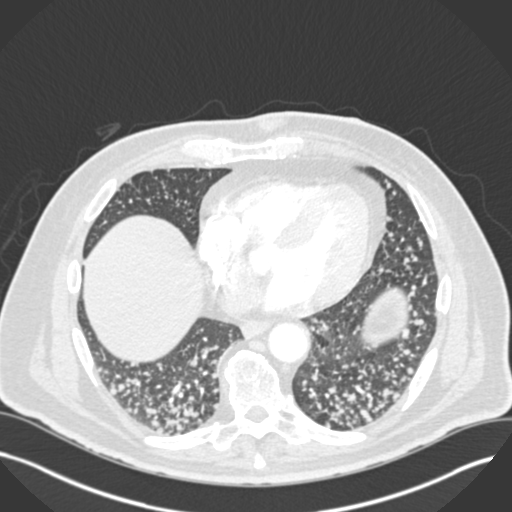
[im 289/641  mediastinal]
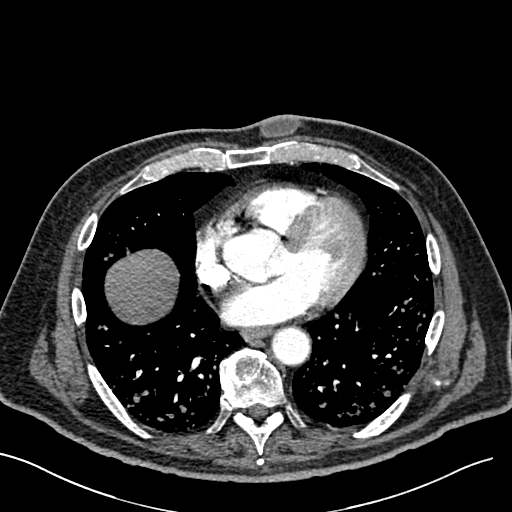
[im 353/641  lung]
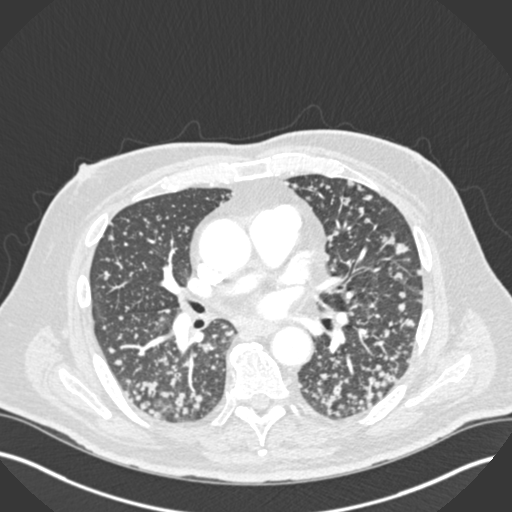
[im 385/641  mediastinal]
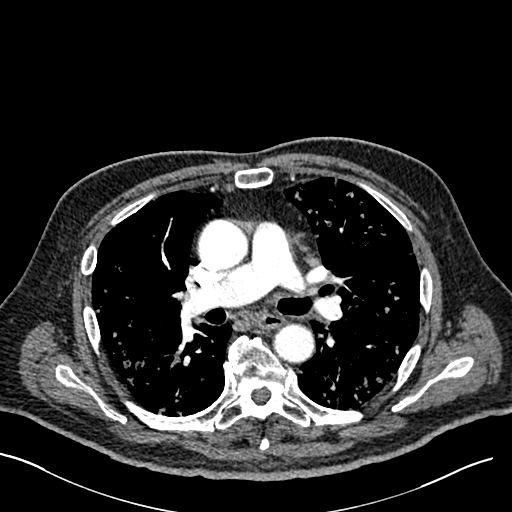
[im 417/641  lung]
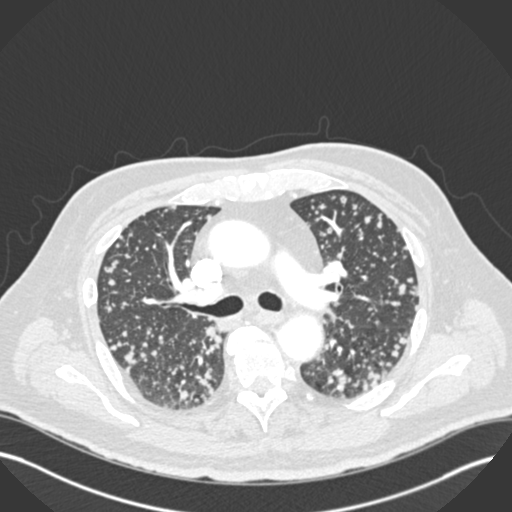
[im 449/641  mediastinal]
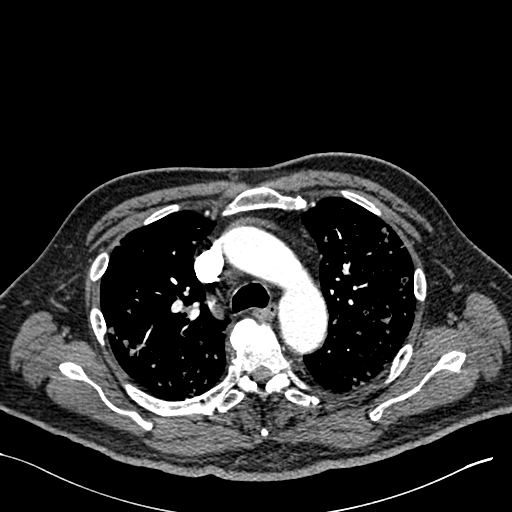
[im 481/641  lung]
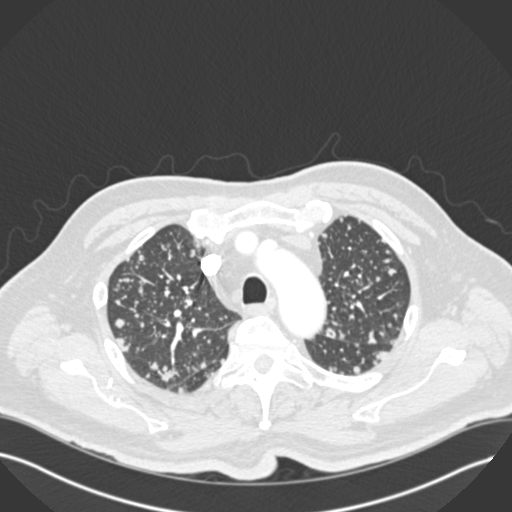
[im 545/641  mediastinal]
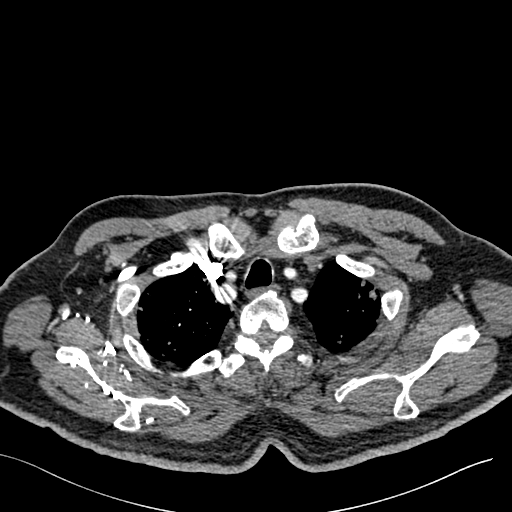
[im 577/641  lung]
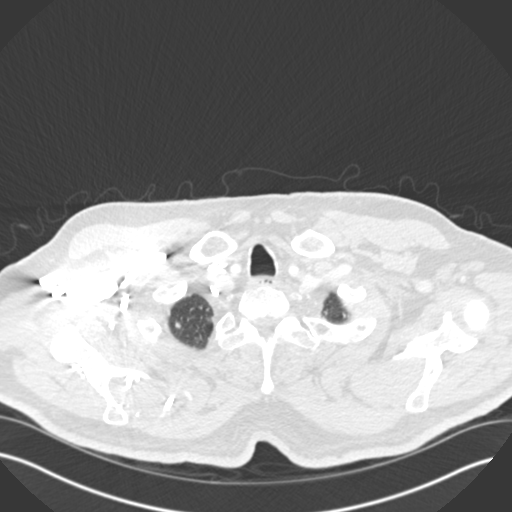
[im 609/641  mediastinal]
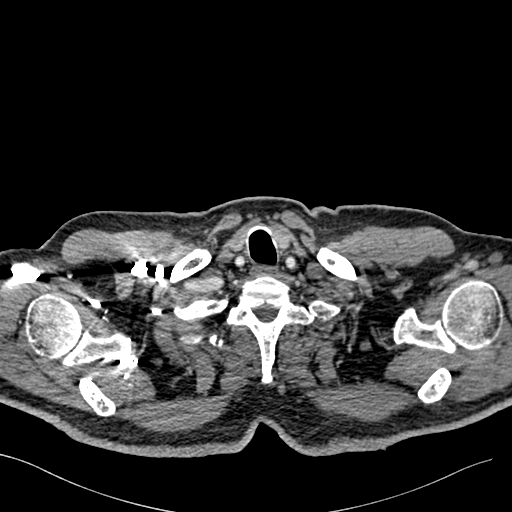

[Series 8: pe coronal mpr · coronal · 0.67mm/px · 1 of 151 slices shown]
[im 76/151  mediastinal]
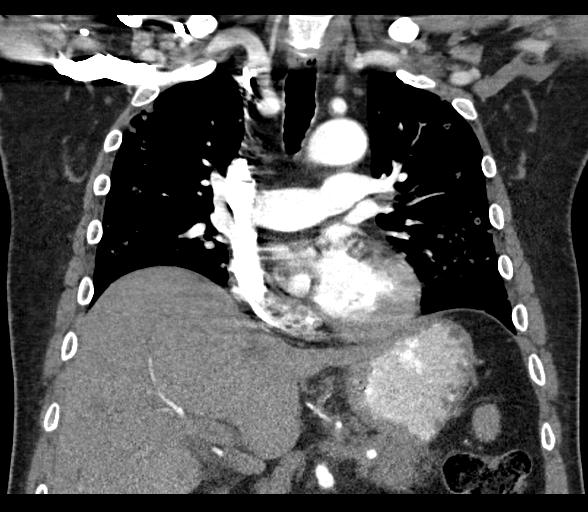

[17 of 36 positions shown; findings below may reference images not displayed]

FINDINGS: CTA CHEST FINDINGS

Cardiovascular: Satisfactory opacification of the pulmonary arteries
to the segmental level. No evidence of pulmonary embolism. Normal
heart size. No pericardial effusion.

Mediastinum/Nodes: No enlarged mediastinal, hilar, or axillary lymph
nodes. Thyroid gland, trachea, and esophagus demonstrate no
significant findings.

Lungs/Pleura: No pneumothorax or pleural effusion is noted.
Innumerable small nodules are noted throughout both lungs concerning
for diffuse metastatic disease or less likely atypical infection.

Musculoskeletal: No chest wall abnormality. No acute or significant
osseous findings.

Review of the MIP images confirms the above findings.

CT ABDOMEN and PELVIS FINDINGS

Hepatobiliary: No gallstones are noted. Mild right intrahepatic
biliary dilatation is noted. Multiple rounded low densities are
noted in the hepatic parenchyma consistent with metastatic disease.
The largest measures 3.3 cm in posterior segment of right hepatic
lobe.

Pancreas: Ill-defined mass measuring 8.3 x 3.0 cm is seen involving
pancreatic tail which extends into the splenic hilum, and this is
concerning for pancreatic malignancy. No ductal dilatation is noted.

Spleen: The spleen is self is unremarkable, although pancreatic mass
is seen extending into the hilum.

Adrenals/Urinary Tract: Adrenal glands are unremarkable. Kidneys are
normal, without renal calculi, focal lesion, or hydronephrosis.
Bladder is unremarkable.

Stomach/Bowel: Stomach is within normal limits. Appendix appears
normal. No evidence of bowel wall thickening, distention, or
inflammatory changes.

Vascular/Lymphatic: No significant vascular findings are present. No
enlarged abdominal or pelvic lymph nodes.

Reproductive: Prostate is unremarkable.

Other: No abdominal wall hernia or abnormality. No abdominopelvic
ascites.

Musculoskeletal: No acute or significant osseous findings.

Review of the MIP images confirms the above findings.
IMPRESSION: 1. No definite evidence of pulmonary embolus.
2. Innumerable small nodules are noted throughout both lungs
concerning for diffuse metastatic disease or less likely atypical
infection.
3. 8.3 x 3.0 cm ill-defined mass is seen involving pancreatic tail
which extends into the splenic hilum, and this is concerning for
pancreatic malignancy.
4. Multiple hepatic metastases are noted. Mild right intrahepatic
biliary dilatation is noted.

These results will be called to the ordering clinician or
representative by the Radiologist Assistant, and communication
documented in the PACS or zVision Dashboard.

## 2020-03-08 MED ORDER — HYDROCODONE-ACETAMINOPHEN 5-325 MG PO TABS: 1.0000 | ORAL_TABLET | Freq: Four times a day (QID) | ORAL | 0 refills | Status: DC | PRN

## 2020-03-08 MED ORDER — IOPAMIDOL (ISOVUE-370) INJECTION 76%
100.0000 mL | Freq: Once | INTRAVENOUS | Status: AC | PRN
  Administered 2020-03-08: 100 mL via INTRAVENOUS

## 2020-03-08 MED ORDER — DIAZEPAM 5 MG PO TABS: 2.5000 mg | ORAL_TABLET | Freq: Three times a day (TID) | ORAL | 0 refills | Status: DC | PRN

## 2020-03-08 MED ORDER — ONDANSETRON 8 MG PO TBDP: 8.0000 mg | ORAL_TABLET | Freq: Three times a day (TID) | ORAL | 3 refills | Status: AC | PRN

## 2020-03-08 NOTE — Progress Notes (Signed)
Melvin [name] is a 59 y.o. male who presents to  [place] at [facility]  today, 03/08/20, seeking care for the following: . Abdominal pain  . Hypoglycemia     ASSESSMENT & PLAN with other pertinent history/findings:  The primary encounter diagnosis was Generalized abdominal pain. Diagnoses of Type 2 diabetes mellitus with other specified complication, with long-term current use of insulin ([place]), Other fatigue, Hypoglycemia, Tachycardia, Short of breath on exertion, Abnormal urine odor, Abdominal pain, unspecified abdominal location, Shortness of breath, and Elevated serum alkaline phosphatase level were also pertinent to this visit.  Saw patient earlier today, c/o persistent migratory abdominal pain without BM change, (+)nausea, intermittent fever. I opted for labs and imaging today. Looks like metastatic cancer.   Discussion w/ patient and family.  Images reviewed personally w/ patient and wife  Urgent referral to cancer center  Continue current insulin  Pain meds, anti-anxiety meds, nausea meds sent to pharmacy Advised patient that this appears to be metastatic to liver and lungs at least. Oncology will be better able to answer questions regarding prognosis and treatment options.     Orders Placed This Encounter  Procedures  . Urine Culture  . CT ABDOMEN PELVIS W CONTRAST  . CT ANGIO CHEST PE W OR WO CONTRAST  . COMPLETE METABOLIC PANEL WITH GFR  . CBC with Differential/Platelet  . Urine Microscopic  . Lipase  . POCT URINALYSIS DIP (CLIN[place]K)  . EKG 12-Lead   CT ANGIO CHEST PE W OR WO CONTRAST  Result Date: 03/08/2020 CLINICAL DATA:  Chest pain, generalized abdominal pain for 1 month. EXAM: CT ANGIOGRAPHY CHEST CT ABDOMEN AND PELVIS WITH CONTRAST TECHNIQUE: Multidetector CT imaging of the chest was performed using the standard protocol during bolus administration of intravenous contrast. Multiplanar CT image reconstructions and MIPs  were obtained to evaluate the vascular anatomy. Multidetector CT imaging of the abdomen and pelvis was performed using the standard protocol during bolus administration of intravenous contrast. CONTRAST:  168mL ISOVUE-370 IOPAMIDOL (ISOVUE-370) INJECTION 76% COMPARISON:  None. FINDINGS: CTA CHEST FINDINGS Cardiovascular: Satisfactory opacification of the pulmonary arteries to the segmental level. No evidence of pulmonary embolism. Normal heart size. No pericardial effusion. Mediastinum/Nodes: No enlarged mediastinal, hilar, or axillary lymph nodes. Thyroid gland, trachea, and esophagus demonstrate no significant findings. Lungs/Pleura: No pneumothorax or pleural effusion is noted. Innumerable small nodules are noted throughout both lungs concerning for diffuse metastatic disease or less likely atypical infection. Musculoskeletal: No chest wall abnormality. No acute or significant osseous findings. Review of the MIP images confirms the above findings. CT ABDOMEN and PELVIS FINDINGS Hepatobiliary: No gallstones are noted. Mild right intrahepatic biliary dilatation is noted. Multiple rounded low densities are noted in the hepatic parenchyma consistent with metastatic disease. The largest measures 3.3 cm in posterior segment of right hepatic lobe. Pancreas: Ill-defined mass measuring 8.3 x 3.0 cm is seen involving pancreatic tail which extends into the splenic hilum, and this is concerning for pancreatic malignancy. No ductal dilatation is noted. Spleen: The spleen is self is unremarkable, although pancreatic mass is seen extending into the hilum. Adrenals/Urinary Tract: Adrenal glands are unremarkable. Kidneys are normal, without renal calculi, focal lesion, or hydronephrosis. Bladder is unremarkable. Stomach/Bowel: Stomach is within normal limits. Appendix appears normal. No evidence of bowel wall thickening, distention, or inflammatory changes. Vascular/Lymphatic: No significant vascular findings are present. No  enlarged abdominal or pelvic lymph nodes. Reproductive: Prostate is unremarkable. Other: No abdominal wall hernia or abnormality. No abdominopelvic ascites. Musculoskeletal:  No acute or significant osseous findings. Review of the MIP images confirms the above findings. IMPRESSION: 1. No definite evidence of pulmonary embolus. 2. Innumerable small nodules are noted throughout both lungs concerning for diffuse metastatic disease or less likely atypical infection. 3. 8.3 x 3.0 cm ill-defined mass is seen involving pancreatic tail which extends into the splenic hilum, and this is concerning for pancreatic malignancy. 4. Multiple hepatic metastases are noted. Mild right intrahepatic biliary dilatation is noted. These results will be called to the ordering clinician or representative by the Radiologist Assistant, and communication documented in the PACS or zVision Dashboard. Electronically Signed   By: Marijo Conception M.D.   On: 03/08/2020 15:26   CT ABDOMEN PELVIS W CONTRAST  Result Date: 03/08/2020 CLINICAL DATA:  Chest pain, generalized abdominal pain for 1 month. EXAM: CT ANGIOGRAPHY CHEST CT ABDOMEN AND PELVIS WITH CONTRAST TECHNIQUE: Multidetector CT imaging of the chest was performed using the standard protocol during bolus administration of intravenous contrast. Multiplanar CT image reconstructions and MIPs were obtained to evaluate the vascular anatomy. Multidetector CT imaging of the abdomen and pelvis was performed using the standard protocol during bolus administration of intravenous contrast. CONTRAST:  143mL ISOVUE-370 IOPAMIDOL (ISOVUE-370) INJECTION 76% COMPARISON:  None. FINDINGS: CTA CHEST FINDINGS Cardiovascular: Satisfactory opacification of the pulmonary arteries to the segmental level. No evidence of pulmonary embolism. Normal heart size. No pericardial effusion. Mediastinum/Nodes: No enlarged mediastinal, hilar, or axillary lymph nodes. Thyroid gland, trachea, and esophagus demonstrate no  significant findings. Lungs/Pleura: No pneumothorax or pleural effusion is noted. Innumerable small nodules are noted throughout both lungs concerning for diffuse metastatic disease or less likely atypical infection. Musculoskeletal: No chest wall abnormality. No acute or significant osseous findings. Review of the MIP images confirms the above findings. CT ABDOMEN and PELVIS FINDINGS Hepatobiliary: No gallstones are noted. Mild right intrahepatic biliary dilatation is noted. Multiple rounded low densities are noted in the hepatic parenchyma consistent with metastatic disease. The largest measures 3.3 cm in posterior segment of right hepatic lobe. Pancreas: Ill-defined mass measuring 8.3 x 3.0 cm is seen involving pancreatic tail which extends into the splenic hilum, and this is concerning for pancreatic malignancy. No ductal dilatation is noted. Spleen: The spleen is self is unremarkable, although pancreatic mass is seen extending into the hilum. Adrenals/Urinary Tract: Adrenal glands are unremarkable. Kidneys are normal, without renal calculi, focal lesion, or hydronephrosis. Bladder is unremarkable. Stomach/Bowel: Stomach is within normal limits. Appendix appears normal. No evidence of bowel wall thickening, distention, or inflammatory changes. Vascular/Lymphatic: No significant vascular findings are present. No enlarged abdominal or pelvic lymph nodes. Reproductive: Prostate is unremarkable. Other: No abdominal wall hernia or abnormality. No abdominopelvic ascites. Musculoskeletal: No acute or significant osseous findings. Review of the MIP images confirms the above findings. IMPRESSION: 1. No definite evidence of pulmonary embolus. 2. Innumerable small nodules are noted throughout both lungs concerning for diffuse metastatic disease or less likely atypical infection. 3. 8.3 x 3.0 cm ill-defined mass is seen involving pancreatic tail which extends into the splenic hilum, and this is concerning for pancreatic  malignancy. 4. Multiple hepatic metastases are noted. Mild right intrahepatic biliary dilatation is noted. These results will be called to the ordering clinician or representative by the Radiologist Assistant, and communication documented in the PACS or zVision Dashboard. Electronically Signed   By: Marijo Conception M.D.   On: 03/08/2020 15:26     Meds ordered this encounter  Medications  . HYDROcodone-acetaminophen (NORCO/VICODIN) 5-325 MG tablet  Sig: Take 1-2 tablets by mouth every 6 (six) hours as needed for moderate pain or severe pain.    Dispense:  30 tablet    Refill:  0  . ondansetron (ZOFRAN-ODT) 8 MG disintegrating tablet    Sig: Take 1 tablet (8 mg total) by mouth every 8 (eight) hours as needed for nausea.    Dispense:  40 tablet    Refill:  3  . diazepam (VALIUM) 5 MG tablet    Sig: Take 0.5-1 tablets (2.5-5 mg total) by mouth every 8 (eight) hours as needed for anxiety.    Dispense:  30 tablet    Refill:  0       Follow-up instructions: Return for RECHECK PENDING RESULTS / IF WORSE OR CHANGE.                                         BP 132/88   Pulse (!) 108   Temp 97.7 F (36.5 C) (Oral)   Ht 6\' 3"  (1.905 m)   Wt 237 lb (107.5 kg)   SpO2 95%   BMI 29.62 kg/m   No outpatient medications have been marked as taking for the 03/08/20 encounter (Office Visit) with Emeterio Reeve, DO.    Results for orders placed or performed in visit on 03/08/20 (from the past 72 hour(s))  COMPLETE METABOLIC PANEL WITH GFR     Status: Abnormal   Collection Time: 03/08/20 10:58 AM  Result Value Ref Range   Glucose, Bld 386 (H) 65 - 99 mg/dL    Comment: .            Fasting reference interval . For someone without known diabetes, a glucose value >125 mg/dL indicates that they may have diabetes and this should be confirmed with a follow-up test. .    BUN 18 7 - 25 mg/dL   Creat 0.75 0.70 - 1.33 mg/dL    Comment: For patients >62  years of age, the reference limit for Creatinine is approximately 13% higher for people identified as African-American. .    GFR, Est Non African American 102 > OR = 60 mL/min/1.5m2   GFR, Est African American 118 > OR = 60 mL/min/1.1m2   BUN/Creatinine Ratio NOT APPLICABLE 6 - 22 (calc)   Sodium 135 135 - 146 mmol/L   Potassium 4.7 3.5 - 5.3 mmol/L   Chloride 98 98 - 110 mmol/L   CO2 29 20 - 32 mmol/L   Calcium 9.5 8.6 - 10.3 mg/dL   Total Protein 6.9 6.1 - 8.1 g/dL   Albumin 3.8 3.6 - 5.1 g/dL   Globulin 3.1 1.9 - 3.7 g/dL (calc)   AG Ratio 1.2 1.0 - 2.5 (calc)   Total Bilirubin 0.9 0.2 - 1.2 mg/dL   Alkaline phosphatase (APISO) 1,149 (H) 35 - 144 U/L   AST 73 (H) 10 - 35 U/L   ALT 83 (H) 9 - 46 U/L  CBC with Differential/Platelet     Status: Abnormal   Collection Time: 03/08/20 10:58 AM  Result Value Ref Range   WBC 9.0 3.8 - 10.8 Thousand/uL   RBC 4.43 4.20 - 5.80 Million/uL   Hemoglobin 12.9 (L) 13.2 - 17.1 g/dL   HCT 40.2 38.5 - 50.0 %   MCV 90.7 80.0 - 100.0 fL   MCH 29.1 27.0 - 33.0 pg   MCHC 32.1 32.0 - 36.0 g/dL   RDW  12.3 11.0 - 15.0 %   Platelets 321 140 - 400 Thousand/uL   MPV 11.2 7.5 - 12.5 fL   Neutro Abs 6,417 1,500 - 7,800 cells/uL   Lymphs Abs 1,503 850 - 3,900 cells/uL   Absolute Monocytes 945 200 - 950 cells/uL   Eosinophils Absolute 81 15 - 500 cells/uL   Basophils Absolute 54 0 - 200 cells/uL   Neutrophils Relative % 71.3 %   Total Lymphocyte 16.7 %   Monocytes Relative 10.5 %   Eosinophils Relative 0.9 %   Basophils Relative 0.6 %  Urine Microscopic     Status: Abnormal   Collection Time: 03/08/20 10:58 AM  Result Value Ref Range   WBC, UA 0-5 0 - 5 /HPF   RBC / HPF NONE SEEN 0 - 2 /HPF   Squamous Epithelial / LPF 0-5 < OR = 5 /HPF   Bacteria, UA NONE SEEN NONE SEEN /HPF   Hyaline Cast NONE SEEN NONE SEEN /LPF   Yeast FEW (A) NONE SEEN /HPF  Lipase     Status: None   Collection Time: 03/08/20 10:58 AM  Result Value Ref Range   Lipase  28 7 - 60 U/L     Depression screen PHQ 2/9 03/08/2020  Decreased Interest 1  Down, Depressed, Hopeless 0  PHQ - 2 Score 1    No flowsheet data found.    All questions at time of visit were answered - patient instructed to contact office with any additional concerns or updates.  ER/RTC precautions were reviewed with the patient.  Please note: voice recognition software was used to produce this document, and typos may escape review. Please contact Dr. Sheppard Coil for any needed clarifications.

## 2020-03-09 ENCOUNTER — Telehealth: Payer: PRIVATE HEALTH INSURANCE | Admitting: Osteopathic Medicine

## 2020-03-09 ENCOUNTER — Encounter: Payer: Self-pay | Admitting: *Deleted

## 2020-03-09 NOTE — Progress Notes (Signed)
Reached out to Melvin [name] to introduce myself as the office RN Navigator and explain our new patient process. Reviewed the reason for their referral and scheduled their new patient appointment along with labs. Provided address and directions to the office including call back phone number. Reviewed with patient any concerns they may have or any possible barriers to attending their appointment.   Informed patient about my role as a navigator and that I will meet with them prior to their New Patient appointment and more fully discuss what services I can provide. At this time patient has no further questions or needs.

## 2020-03-10 LAB — CBC WITH DIFFERENTIAL/PLATELET
Absolute Monocytes: 945 cells/uL (ref 200–950)
Basophils Absolute: 54 cells/uL (ref 0–200)
Basophils Relative: 0.6 %
Eosinophils Absolute: 81 cells/uL (ref 15–500)
Eosinophils Relative: 0.9 %
HCT: 40.2 % (ref 38.5–50.0)
Hemoglobin: 12.9 g/dL — ABNORMAL LOW (ref 13.2–17.1)
Lymphs Abs: 1503 cells/uL (ref 850–3900)
MCH: 29.1 pg (ref 27.0–33.0)
MCHC: 32.1 g/dL (ref 32.0–36.0)
MCV: 90.7 fL (ref 80.0–100.0)
MPV: 11.2 fL (ref 7.5–12.5)
Monocytes Relative: 10.5 %
Neutro Abs: 6417 cells/uL (ref 1500–7800)
Neutrophils Relative %: 71.3 %
Platelets: 321 10*3/uL (ref 140–400)
RBC: 4.43 10*6/uL (ref 4.20–5.80)
RDW: 12.3 % (ref 11.0–15.0)
Total Lymphocyte: 16.7 %
WBC: 9 10*3/uL (ref 3.8–10.8)

## 2020-03-10 LAB — COMPLETE METABOLIC PANEL WITH GFR
AG Ratio: 1.2 (calc) (ref 1.0–2.5)
ALT: 83 U/L — ABNORMAL HIGH (ref 9–46)
AST: 73 U/L — ABNORMAL HIGH (ref 10–35)
Albumin: 3.8 g/dL (ref 3.6–5.1)
Alkaline phosphatase (APISO): 1149 U/L — ABNORMAL HIGH (ref 35–144)
BUN: 18 mg/dL (ref 7–25)
CO2: 29 mmol/L (ref 20–32)
Calcium: 9.5 mg/dL (ref 8.6–10.3)
Chloride: 98 mmol/L (ref 98–110)
Creat: 0.75 mg/dL (ref 0.70–1.33)
GFR, Est African American: 118 mL/min/{1.73_m2} (ref >=60)
GFR, Est Non African American: 102 mL/min/{1.73_m2} (ref >=60)
Globulin: 3.1 g/dL (calc) (ref 1.9–3.7)
Glucose, Bld: 386 mg/dL — ABNORMAL HIGH (ref 65–99)
Potassium: 4.7 mmol/L (ref 3.5–5.3)
Sodium: 135 mmol/L (ref 135–146)
Total Bilirubin: 0.9 mg/dL (ref 0.2–1.2)
Total Protein: 6.9 g/dL (ref 6.1–8.1)

## 2020-03-10 LAB — URINE CULTURE
MICRO NUMBER:: 10278401
Result:: NO GROWTH
SPECIMEN QUALITY:: ADEQUATE

## 2020-03-10 LAB — URINALYSIS, MICROSCOPIC ONLY
Bacteria, UA: NONE SEEN /HPF
Hyaline Cast: NONE SEEN /LPF
RBC / HPF: NONE SEEN /HPF (ref 0–2)

## 2020-03-10 LAB — LIPASE: Lipase: 28 U/L (ref 7–60)

## 2020-03-10 LAB — GAMMA GT: GGT: 1668 U/L — ABNORMAL HIGH (ref 3–85)

## 2020-03-11 ENCOUNTER — Other Ambulatory Visit: Payer: Self-pay

## 2020-03-11 ENCOUNTER — Encounter: Payer: Self-pay | Admitting: *Deleted

## 2020-03-11 ENCOUNTER — Encounter: Payer: Self-pay | Admitting: Hematology & Oncology

## 2020-03-11 ENCOUNTER — Inpatient Hospital Stay: Payer: PRIVATE HEALTH INSURANCE | Attending: Hematology & Oncology | Admitting: Hematology & Oncology

## 2020-03-11 ENCOUNTER — Inpatient Hospital Stay: Payer: PRIVATE HEALTH INSURANCE

## 2020-03-11 VITALS — BP 126/78 | HR 105 | Temp 97.1°F | Resp 20 | Ht 75.5 in | Wt 235.1 lb

## 2020-03-11 DIAGNOSIS — R109 Unspecified abdominal pain: Secondary | ICD-10-CM | POA: Insufficient documentation

## 2020-03-11 DIAGNOSIS — C259 Malignant neoplasm of pancreas, unspecified: Secondary | ICD-10-CM

## 2020-03-11 DIAGNOSIS — R634 Abnormal weight loss: Secondary | ICD-10-CM | POA: Insufficient documentation

## 2020-03-11 DIAGNOSIS — C801 Malignant (primary) neoplasm, unspecified: Secondary | ICD-10-CM | POA: Insufficient documentation

## 2020-03-11 DIAGNOSIS — R05 Cough: Secondary | ICD-10-CM | POA: Insufficient documentation

## 2020-03-11 DIAGNOSIS — Z794 Long term (current) use of insulin: Secondary | ICD-10-CM | POA: Insufficient documentation

## 2020-03-11 DIAGNOSIS — E119 Type 2 diabetes mellitus without complications: Secondary | ICD-10-CM | POA: Insufficient documentation

## 2020-03-11 DIAGNOSIS — R918 Other nonspecific abnormal finding of lung field: Secondary | ICD-10-CM | POA: Insufficient documentation

## 2020-03-11 DIAGNOSIS — C787 Secondary malignant neoplasm of liver and intrahepatic bile duct: Secondary | ICD-10-CM | POA: Diagnosis present

## 2020-03-11 DIAGNOSIS — K8689 Other specified diseases of pancreas: Secondary | ICD-10-CM

## 2020-03-11 DIAGNOSIS — Z87891 Personal history of nicotine dependence: Secondary | ICD-10-CM | POA: Diagnosis not present

## 2020-03-11 LAB — CBC WITH DIFFERENTIAL (CANCER CENTER ONLY)
Abs Immature Granulocytes: 0.05 10*3/uL (ref 0.00–0.07)
Basophils Absolute: 0 10*3/uL (ref 0.0–0.1)
Basophils Relative: 0 %
Eosinophils Absolute: 0.1 10*3/uL (ref 0.0–0.5)
Eosinophils Relative: 1 %
HCT: 39.6 % (ref 39.0–52.0)
Hemoglobin: 12.6 g/dL — ABNORMAL LOW (ref 13.0–17.0)
Immature Granulocytes: 1 %
Lymphocytes Relative: 12 %
Lymphs Abs: 1.3 10*3/uL (ref 0.7–4.0)
MCH: 29.6 pg (ref 26.0–34.0)
MCHC: 31.8 g/dL (ref 30.0–36.0)
MCV: 93 fL (ref 80.0–100.0)
Monocytes Absolute: 1.2 10*3/uL — ABNORMAL HIGH (ref 0.1–1.0)
Monocytes Relative: 12 %
Neutro Abs: 7.5 10*3/uL (ref 1.7–7.7)
Neutrophils Relative %: 74 %
Platelet Count: 304 10*3/uL (ref 150–400)
RBC: 4.26 MIL/uL (ref 4.22–5.81)
RDW: 13.5 % (ref 11.5–15.5)
WBC Count: 10.2 10*3/uL (ref 4.0–10.5)
nRBC: 0 % (ref 0.0–0.2)

## 2020-03-11 LAB — CMP (CANCER CENTER ONLY)
ALT: 86 U/L — ABNORMAL HIGH (ref 0–44)
AST: 63 U/L — ABNORMAL HIGH (ref 15–41)
Albumin: 3.7 g/dL (ref 3.5–5.0)
Alkaline Phosphatase: 1038 U/L — ABNORMAL HIGH (ref 38–126)
Anion gap: 10 (ref 5–15)
BUN: 20 mg/dL (ref 6–20)
CO2: 30 mmol/L (ref 22–32)
Calcium: 9.7 mg/dL (ref 8.9–10.3)
Chloride: 95 mmol/L — ABNORMAL LOW (ref 98–111)
Creatinine: 0.96 mg/dL (ref 0.61–1.24)
GFR, Est AFR Am: >60 mL/min (ref >60)
GFR, Estimated: >60 mL/min (ref >60)
Glucose, Bld: 413 mg/dL — ABNORMAL HIGH (ref 70–99)
Potassium: 4.4 mmol/L (ref 3.5–5.1)
Sodium: 135 mmol/L (ref 135–145)
Total Bilirubin: 1.3 mg/dL — ABNORMAL HIGH (ref 0.3–1.2)
Total Protein: 7.2 g/dL (ref 6.5–8.1)

## 2020-03-11 MED ORDER — HYDROCODONE-HOMATROPINE 5-1.5 MG/5ML PO SYRP: 5.0000 mL | ORAL_SOLUTION | Freq: Four times a day (QID) | ORAL | 0 refills | Status: DC | PRN

## 2020-03-11 MED FILL — HYDROCODONE-HOMATROPINE SOL: 5-1.5 | 12 days supply | Qty: 240 | Fill #0

## 2020-03-11 NOTE — Progress Notes (Signed)
Initial RN Navigator Patient Visit  Name: Melvin [name] Date of Referral : 03/08/20 Diagnosis: Pancreatic Mass  Met with patient prior to their visit with MD. Hanley Seamen patient "Your Patient Navigator" handout which explains my role, areas in which I am able to help, and all the contact information for myself and the office. Also gave patient MD and Navigator business card. Reviewed with patient the general overview of expected course after initial diagnosis and time frame for all steps to be completed.  New patient packet given to patient which includes: orientation to office and staff; campus directory; education on My Chart and Advance Directives; and patient centered education on cancer.  Spoke with patient and his wife. 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 His wife is also worried about loss of job. They currently pay out of pocket for insurance, however with loss of income this may not be possible much longer. I suggested they call Social Services to get an appointment to discuss application for disability and medicaid. Gave them the number to call. After diagnosis and staging complete, we will provide them with whatever documents they need for application.   Patient is seeing Dr Marin Olp at [facility] appointment. Will review visit and speak to Dr Marin Olp regarding plan tomorrow. Patient is aware that I will begin coordination tomorrow and reach out via phone with updates.   Patient understands all follow up procedures and expectations. They have my number to reach out for any further clarification or additional needs.

## 2020-03-11 NOTE — Progress Notes (Signed)
Referral MD  Reason for Referral: Likely metastatic pancreatic cancer-liver and pulmonary metastasis Chief Complaint  Patient presents with  . New Patient (Initial Visit)  : I was told I have a cancer.  HPI: Mr. Sterbenz is a very nice 59 year old white male.  He is originally from [place], [place].  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  He actually moved down to [place] in May 2020.  His children had moved down here.  He has been very healthy.  He really has had no problems until recently.  During Christmas of 2020, he had no problems.  Starting in February, he began to lose weight.  He is lost about 30 pounds.  He began to have some abdominal pain.  He began to cough.  The cough was nonproductive.  He is adopted.  He does not know his family's medical history.  He has had diabetes for many years.  He now is on insulin.  He has had no obvious problems with bowels or bladder.  There may be a little bit of loose stool.  Ultimately, he had a CT of the chest, abdomen and pelvis.  There was a think a CT angiogram of the chest.  This unfortunately showed innumerable small nodules throughout both lungs concerning for metastatic disease.  CT of the abdomen pelvis showed multiple liver metastasis.  The largest measured 3.3 cm in the right lobe of the liver.  He had an 8.3 x 3 cm mass in the pancreatic tail.  This extends into the splenic hilum.  There is no lymphadenopathy.  He had normal bone findings.  Because of this, his doctor, Dr. Sheppard Coil, kindly referred him to the [facility] for an evaluation.  His main problem has been this cough.  I will give him some Hycodan cough elixir to try to help with this.  He does get short of breath.  He does get fatigued a little bit easily.  He has had no bleeding.  He has had no fever.  He has had no rashes.  There is been no leg swelling.  Overall, I would say his  performance status is ECOG 1.   Past Medical History:  Diagnosis Date  . Diabetes mellitus without complication ([place])   . High cholesterol   . Hypertension   :  No past surgical history on file.:   Current Outpatient Medications:  .  atorvastatin (LIPITOR) 10 MG tablet, Take 1 tablet (10 mg total) by mouth daily., Disp: 90 tablet, Rfl: 1 .  diazepam (VALIUM) 5 MG tablet, Take 0.5-1 tablets (2.5-5 mg total) by mouth every 8 (eight) hours as needed for anxiety., Disp: 30 tablet, Rfl: 0 .  HYDROcodone-acetaminophen (NORCO/VICODIN) 5-325 MG tablet, Take 1-2 tablets by mouth every 6 (six) hours as needed for moderate pain or severe pain., Disp: 30 tablet, Rfl: 0 .  insulin aspart (NOVOLOG FLEXPEN) 100 UNIT/ML FlexPen, Inject 15 Units into the skin 3 (three) times daily with meals., Disp: 15 mL, Rfl: 11 .  insulin glargine, 2 Unit Dial, (TOUJEO MAX SOLOSTAR) 300 UNIT/ML Solostar Pen, Inject 25-100 Units into the skin daily. Increase by 5 units at a time, twice per week, to fasting blood sugar 100-150, Disp: 1 pen, Rfl: 0 .  Insulin Pen Needle (PEN NEEDLES) 30G X 8 MM MISC, 1 each by Does not apply route as directed., Disp: 200 each, Rfl: 99 .  losartan (COZAAR) 25 MG tablet,  Take 1 tablet (25 mg total) by mouth daily., Disp: 90 tablet, Rfl: 1 .  metFORMIN (GLUCOPHAGE) 1000 MG tablet, Take 1 tablet (1,000 mg total) by mouth 2 (two) times daily with a meal., Disp: 180 tablet, Rfl: 1 .  metoCLOPramide (REGLAN) 10 MG tablet, Take 0.5 tablets (5 mg total) by mouth as needed for nausea (nausea/reflux)., Disp: 30 tablet, Rfl: 3 .  ondansetron (ZOFRAN-ODT) 8 MG disintegrating tablet, Take 1 tablet (8 mg total) by mouth every 8 (eight) hours as needed for nausea., Disp: 40 tablet, Rfl: 3 .  HYDROcodone-homatropine (HYCODAN) 5-1.5 MG/5ML syrup, Take 5 mLs by mouth every 6 (six) hours as needed for cough., Disp: 240 mL, Rfl: 0:  :  No Known Allergies:  No family history on file.:  Social History    Socioeconomic History  . Marital status: Married    Spouse name: Not on file  . Number of children: Not on file  . Years of education: Not on file  . Highest education level: Not on file  Occupational History  . Not on file  Tobacco Use  . Smoking status: Former Smoker    Types: Cigarettes    Quit date: 03/11/1986    Years since quitting: 34.0  . Smokeless tobacco: Never Used  . Tobacco comment: Quit 1987  Substance and Sexual Activity  . Alcohol use: Not on file    Comment: occ  . Drug use: Not on file  . Sexual activity: Not on file  Other Topics Concern  . Not on file  Social History Narrative  . Not on file   Social Determinants of Health   Financial Resource Strain:   . Difficulty of Paying Living Expenses:   Food Insecurity:   . Worried About Charity fundraiser in the Last Year:   . Arboriculturist in the Last Year:   Transportation Needs:   . Film/video editor (Medical):   Marland Kitchen Lack of Transportation (Non-Medical):   Physical Activity:   . Days of Exercise per Week:   . Minutes of Exercise per Session:   Stress:   . Feeling of Stress :   Social Connections:   . Frequency of Communication with Friends and Family:   . Frequency of Social Gatherings with Friends and Family:   . Attends Religious Services:   . Active Member of Clubs or Organizations:   . Attends Archivist Meetings:   Marland Kitchen Marital Status:   Intimate Partner Violence:   . Fear of Current or Ex-Partner:   . Emotionally Abused:   Marland Kitchen Physically Abused:   . Sexually Abused:   :  Review of Systems  Constitutional: Positive for malaise/fatigue and weight loss.  HENT: Negative.   Eyes: Negative.   Respiratory: Positive for cough.   Cardiovascular: Negative.   Gastrointestinal: Positive for abdominal pain.  Genitourinary: Negative.   Musculoskeletal: Negative.   Neurological: Negative.   Endo/Heme/Allergies: Negative.   Psychiatric/Behavioral: Negative.      Exam:  Physical  Exam Vitals reviewed.  HENT:     Head: Normocephalic and atraumatic.  Eyes:     Pupils: Pupils are equal, round, and reactive to light.  Cardiovascular:     Rate and Rhythm: Normal rate and regular rhythm.     Heart sounds: Normal heart sounds.  Pulmonary:     Effort: Pulmonary effort is normal.     Breath sounds: Normal breath sounds.  Abdominal:     General: Bowel sounds are normal.  Palpations: Abdomen is soft.  Musculoskeletal:        General: No tenderness or deformity. Normal range of motion.     Cervical back: Normal range of motion.  Lymphadenopathy:     Cervical: No cervical adenopathy.  Skin:    General: Skin is warm and dry.     Findings: No erythema or rash.  Neurological:     Mental Status: He is alert and oriented to person, place, and time.  Psychiatric:        Behavior: Behavior normal.        Thought Content: Thought content normal.        Judgment: Judgment normal.     '@IPVITALS'$ @   Recent Labs    03/11/20 1510  WBC 10.2  HGB 12.6*  HCT 39.6  PLT 304   Recent Labs    03/11/20 1510  NA 135  K 4.4  CL 95*  CO2 30  GLUCOSE 413*  BUN 20  CREATININE 0.96  CALCIUM 9.7    Blood smear review: None  Pathology: Pending    Assessment and Plan: Mr. Aiken is a very nice 59 year old white male.  Again, he is from [place].  He does have the [place] accent.  He comes with his wife.  I have to believe that he truly does have metastatic pancreatic cancer.  The CT scans, which I looked at with he and his wife, are pretty consistent with pancreatic cancer.  The only thing that I am troubled by is the extensive pulmonary disease.  Looks like he has a miliary spread of his tumor into his lungs.  This troubles me quite a bit.  I think that if we cannot get a response to treatment relatively quickly, then he might run into more problems with breathing and coughing.  We are going to have to establish a tissue diagnosis.  He will need a biopsy.  I would  think a liver biopsy would probably be easiest and relatively high yield.  I will send off a CA 19-9.  I have to believe that this is going to be quite elevated.  Thankfully, he does have a decent performance status.  I think he would be able to tolerate systemic chemotherapy.  I think that he would be able to handle FOLFIRINOX.  I will send off the tissue specimen for molecular markers.  With pancreatic cancer, the chance of finding a targeted marker would be probably less than 10%.  I will need to see about getting a Port-A-Cath put into him.  We will try to get this all done next week.  I do not see that we have to do any additional radiographic studies.  I do not think he needs a PET scan.  I think the CAT scans clearly shows the problem and would be all that we would have to do for follow-up.  I spent over an hour with he and his wife.  They are very nice.  It was just a joy to talk with him.  I know that this is going to be a tough problem.  We certainly can treat it.  I told him that I thought we will are looking at stage IV pancreatic cancer.  I told him that this is some that we cannot cure but least we can treat to try to prolong his life and to give him some quality of life.  I did give him a prayer blanket.  He was very thankful for this.  Hopefully, we can help with his cough.  It sounds like he might be having some trouble try to afford some of his medications..  I did give him some free samples of Creon.  I told him to take 2 Creon capsules an hour before each meal.  I will try to get him back within 10 days or so so we can try to get some kind of treatment started.

## 2020-03-12 ENCOUNTER — Encounter: Payer: Self-pay | Admitting: *Deleted

## 2020-03-12 ENCOUNTER — Encounter (HOSPITAL_COMMUNITY): Payer: Self-pay

## 2020-03-12 ENCOUNTER — Telehealth: Payer: Self-pay | Admitting: Hematology & Oncology

## 2020-03-12 ENCOUNTER — Encounter: Payer: Self-pay | Admitting: Hematology & Oncology

## 2020-03-12 DIAGNOSIS — C7800 Secondary malignant neoplasm of unspecified lung: Secondary | ICD-10-CM

## 2020-03-12 DIAGNOSIS — C259 Malignant neoplasm of pancreas, unspecified: Secondary | ICD-10-CM | POA: Insufficient documentation

## 2020-03-12 DIAGNOSIS — C787 Secondary malignant neoplasm of liver and intrahepatic bile duct: Secondary | ICD-10-CM

## 2020-03-12 HISTORY — DX: Secondary malignant neoplasm of liver and intrahepatic bile duct: C78.7

## 2020-03-12 HISTORY — DX: Secondary malignant neoplasm of unspecified lung: C78.00

## 2020-03-12 HISTORY — DX: Malignant neoplasm of pancreas, unspecified: C25.9

## 2020-03-12 LAB — LACTATE DEHYDROGENASE: LDH: 246 U/L — ABNORMAL HIGH (ref 98–192)

## 2020-03-12 LAB — CANCER ANTIGEN 19-9: CA 19-9: 107932 U/mL — ABNORMAL HIGH (ref 0–35)

## 2020-03-12 LAB — CHROMOGRANIN A: Chromogranin A (ng/mL): 102 ng/mL — ABNORMAL HIGH (ref 0.0–101.8)

## 2020-03-12 NOTE — Addendum Note (Signed)
Addended by: Burney Gauze R on: 03/12/2020 01:56 PM   Modules accepted: Orders

## 2020-03-12 NOTE — Addendum Note (Signed)
Addended by: Burney Gauze R on: 03/12/2020 01:54 PM   Modules accepted: Orders

## 2020-03-12 NOTE — Telephone Encounter (Signed)
No los 3/25

## 2020-03-12 NOTE — Progress Notes (Signed)
Reviewed patient appointment. He will need biopsy and port placement in addition to chemo education and nutrition consult.   Contacted IR and was able to get patient scheduled for biopsy 03/16/20 and port placement scheduled for 03/19/20.  Chemo education and nutrition consult also scheduled. Dr Marin Olp would like chemo to be initiated on 4/6 so these appointments are also made.   Called and spoke with patient and wife. Reviewed all appointments. Will also send calendar with appointment instructions for reinforcement.

## 2020-03-12 NOTE — Progress Notes (Unsigned)
[name] Male, 59 y.o., 1961/10/20 MRN:  [id number] Phone:  [phone] Jerilynn Mages) PCP:  Emeterio Reeve, DO Coverage:  Generic Commercial/Generic Commercial Next Appt With Radiology ([facility] 2) 03/16/2020 at 1:00 PM  RE: Biospy Received: Today Message Contents  Aletta Edouard, MD  Lenore Cordia      [place] for US guided liver lesion biopsy with sedation.   GY   Previous Messages  ----- Message -----  From: Lenore Cordia  Sent: 03/12/2020  9:48 AM EDT  To: [facility] Procedure Requests  Subject: Biospy                      Procedure Requested: US Biopsy (liver)    Reason for Procedure: likely met pancreatic cancer -- need multiple biopsies for molecular studies    Provider Requesting: Volanda Napoleon  Provider Telephone:  [phone]    Other Info: Rad Exams in Epic

## 2020-03-12 NOTE — Progress Notes (Signed)
START ON PATHWAY REGIMEN - Pancreatic Adenocarcinoma     A cycle is every 14 days:     Oxaliplatin      Leucovorin      Irinotecan      Fluorouracil   **Always confirm dose/schedule in your pharmacy ordering system**  Patient Characteristics: Metastatic Disease, First Line, PS = 0,1, BRCA1/2 and PALB2  Mutation Absent/Unknown Therapeutic Status: Metastatic Disease Line of Therapy: First Line ECOG Performance Status: 0 BRCA1/2 Mutation Status: Awaiting Test Results PALB2 Mutation Status: Awaiting Test Results Intent of Therapy: Non-Curative / Palliative Intent, Discussed with Patient

## 2020-03-14 ENCOUNTER — Other Ambulatory Visit: Payer: Self-pay | Admitting: Radiology

## 2020-03-16 ENCOUNTER — Other Ambulatory Visit: Payer: Self-pay

## 2020-03-16 ENCOUNTER — Ambulatory Visit (HOSPITAL_COMMUNITY)
Admission: RE | Admit: 2020-03-16 | Discharge: 2020-03-16 | Disposition: A | Discharge status: Home / Self Care | Payer: PRIVATE HEALTH INSURANCE | Source: Ambulatory Visit | Attending: Hematology & Oncology | Admitting: Hematology & Oncology

## 2020-03-16 ENCOUNTER — Encounter: Payer: Self-pay | Admitting: Hematology & Oncology

## 2020-03-16 ENCOUNTER — Encounter (HOSPITAL_COMMUNITY): Payer: Self-pay

## 2020-03-16 DIAGNOSIS — Z87891 Personal history of nicotine dependence: Secondary | ICD-10-CM | POA: Diagnosis not present

## 2020-03-16 DIAGNOSIS — E119 Type 2 diabetes mellitus without complications: Secondary | ICD-10-CM | POA: Diagnosis not present

## 2020-03-16 DIAGNOSIS — K8689 Other specified diseases of pancreas: Secondary | ICD-10-CM

## 2020-03-16 DIAGNOSIS — Z79899 Other long term (current) drug therapy: Secondary | ICD-10-CM | POA: Diagnosis not present

## 2020-03-16 DIAGNOSIS — Z794 Long term (current) use of insulin: Secondary | ICD-10-CM | POA: Diagnosis not present

## 2020-03-16 DIAGNOSIS — K5900 Constipation, unspecified: Secondary | ICD-10-CM | POA: Diagnosis not present

## 2020-03-16 DIAGNOSIS — R0609 Other forms of dyspnea: Secondary | ICD-10-CM | POA: Diagnosis not present

## 2020-03-16 DIAGNOSIS — R160 Hepatomegaly, not elsewhere classified: Secondary | ICD-10-CM | POA: Insufficient documentation

## 2020-03-16 DIAGNOSIS — I10 Essential (primary) hypertension: Secondary | ICD-10-CM | POA: Diagnosis not present

## 2020-03-16 DIAGNOSIS — R110 Nausea: Secondary | ICD-10-CM | POA: Insufficient documentation

## 2020-03-16 DIAGNOSIS — R918 Other nonspecific abnormal finding of lung field: Secondary | ICD-10-CM | POA: Diagnosis not present

## 2020-03-16 DIAGNOSIS — C787 Secondary malignant neoplasm of liver and intrahepatic bile duct: Secondary | ICD-10-CM | POA: Insufficient documentation

## 2020-03-16 DIAGNOSIS — E7800 Pure hypercholesterolemia, unspecified: Secondary | ICD-10-CM | POA: Insufficient documentation

## 2020-03-16 DIAGNOSIS — C259 Malignant neoplasm of pancreas, unspecified: Secondary | ICD-10-CM | POA: Insufficient documentation

## 2020-03-16 HISTORY — DX: Constipation, unspecified: K59.00

## 2020-03-16 HISTORY — DX: Dyspnea, unspecified: R06.00

## 2020-03-16 LAB — CBC WITH DIFFERENTIAL/PLATELET
Abs Immature Granulocytes: 0.03 10*3/uL (ref 0.00–0.07)
Basophils Absolute: 0.1 10*3/uL (ref 0.0–0.1)
Basophils Relative: 1 %
Eosinophils Absolute: 0.1 10*3/uL (ref 0.0–0.5)
Eosinophils Relative: 1 %
HCT: 37.4 % — ABNORMAL LOW (ref 39.0–52.0)
Hemoglobin: 11.7 g/dL — ABNORMAL LOW (ref 13.0–17.0)
Immature Granulocytes: 0 %
Lymphocytes Relative: 13 %
Lymphs Abs: 1 10*3/uL (ref 0.7–4.0)
MCH: 29.3 pg (ref 26.0–34.0)
MCHC: 31.3 g/dL (ref 30.0–36.0)
MCV: 93.7 fL (ref 80.0–100.0)
Monocytes Absolute: 0.7 10*3/uL (ref 0.1–1.0)
Monocytes Relative: 9 %
Neutro Abs: 6.2 10*3/uL (ref 1.7–7.7)
Neutrophils Relative %: 76 %
Platelets: 293 10*3/uL (ref 150–400)
RBC: 3.99 MIL/uL — ABNORMAL LOW (ref 4.22–5.81)
RDW: 13.5 % (ref 11.5–15.5)
WBC: 8.1 10*3/uL (ref 4.0–10.5)
nRBC: 0 % (ref 0.0–0.2)

## 2020-03-16 LAB — PROTIME-INR
INR: 1 (ref 0.8–1.2)
Prothrombin Time: 13.1 seconds (ref 11.4–15.2)

## 2020-03-16 LAB — COMPREHENSIVE METABOLIC PANEL
ALT: 79 U/L — ABNORMAL HIGH (ref 0–44)
AST: 64 U/L — ABNORMAL HIGH (ref 15–41)
Albumin: 3.1 g/dL — ABNORMAL LOW (ref 3.5–5.0)
Alkaline Phosphatase: 960 U/L — ABNORMAL HIGH (ref 38–126)
Anion gap: 12 (ref 5–15)
BUN: 15 mg/dL (ref 6–20)
CO2: 28 mmol/L (ref 22–32)
Calcium: 9.1 mg/dL (ref 8.9–10.3)
Chloride: 99 mmol/L (ref 98–111)
Creatinine, Ser: 0.73 mg/dL (ref 0.61–1.24)
GFR calc Af Amer: >60 mL/min (ref >60)
GFR calc non Af Amer: >60 mL/min (ref >60)
Glucose, Bld: 306 mg/dL — ABNORMAL HIGH (ref 70–99)
Potassium: 3.8 mmol/L (ref 3.5–5.1)
Sodium: 139 mmol/L (ref 135–145)
Total Bilirubin: 1.2 mg/dL (ref 0.3–1.2)
Total Protein: 7.3 g/dL (ref 6.5–8.1)

## 2020-03-16 IMAGING — US US BIOPSY CORE LIVER
1 series · 7 of 7 positions shown · non-contrast
Comparison: none

INDICATION: Pancreatic mass, multiple liver lesions and lung nodules. The
patient presents for biopsy of a liver lesion to establish
diagnosis.

[Series 1: us biopsy core liver · 7 of 7 slices shown]
[im 1/7]
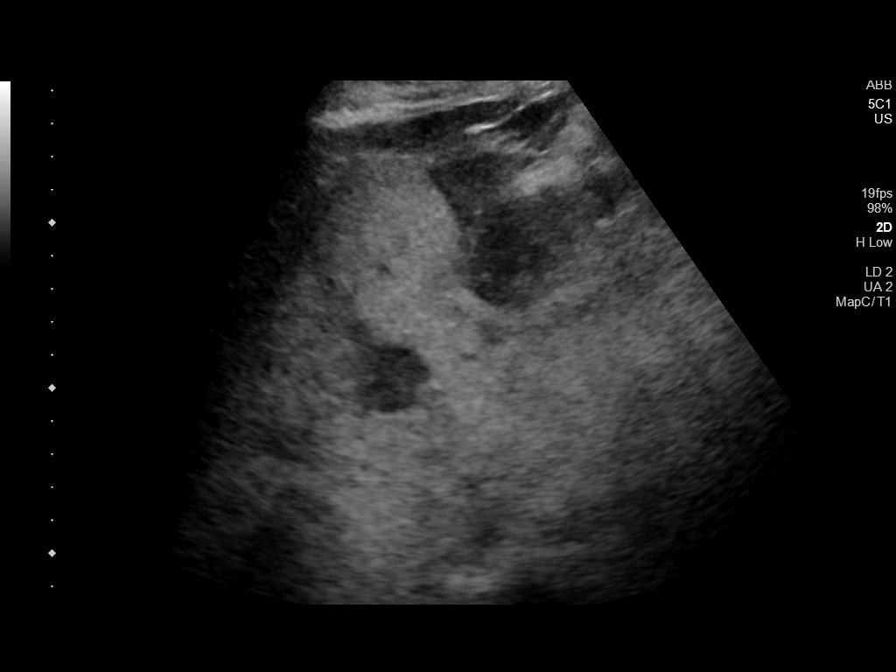
[im 2/7]
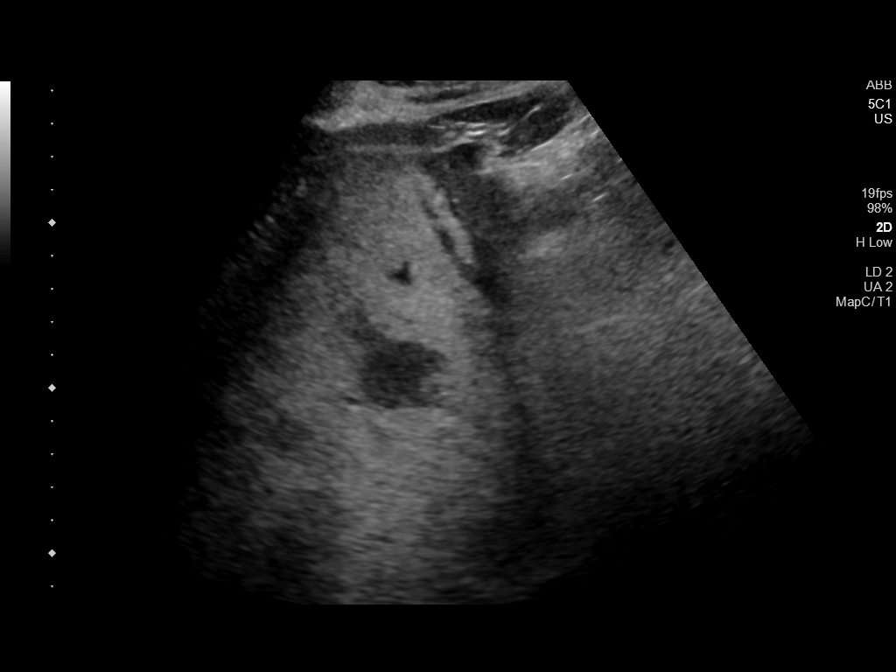
[im 3/7]
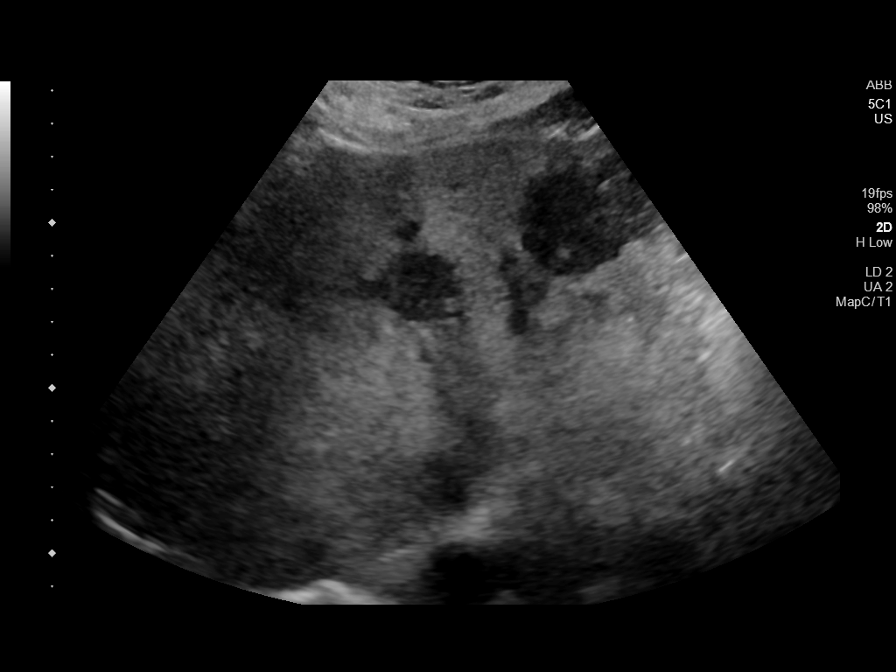
[im 4/7]
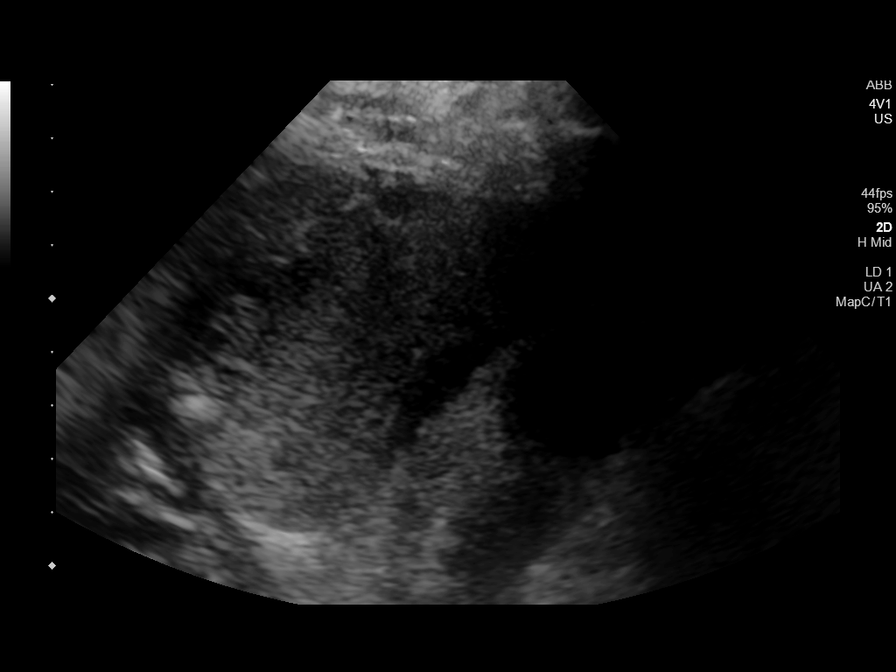
[im 5/7]
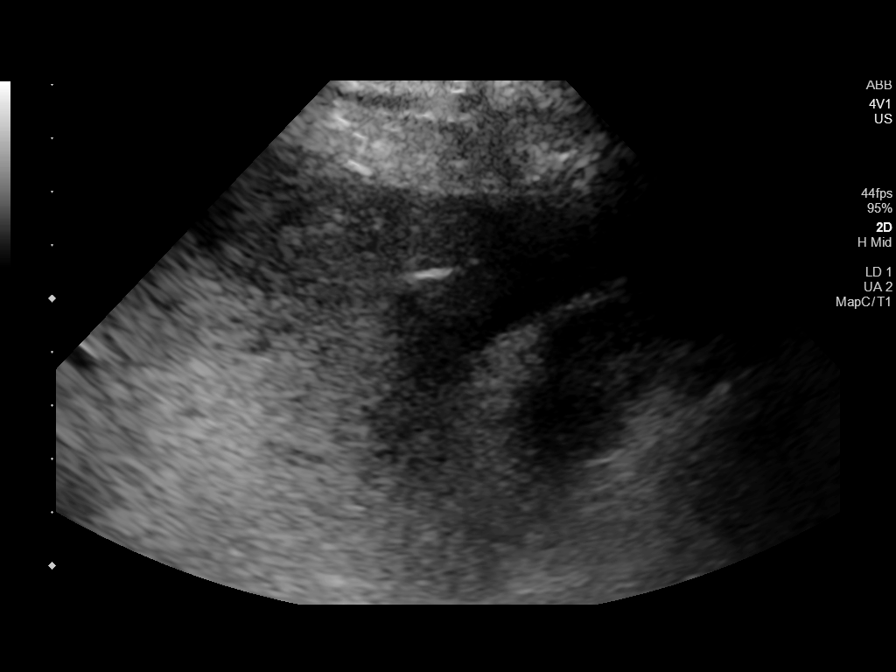
[im 6/7]
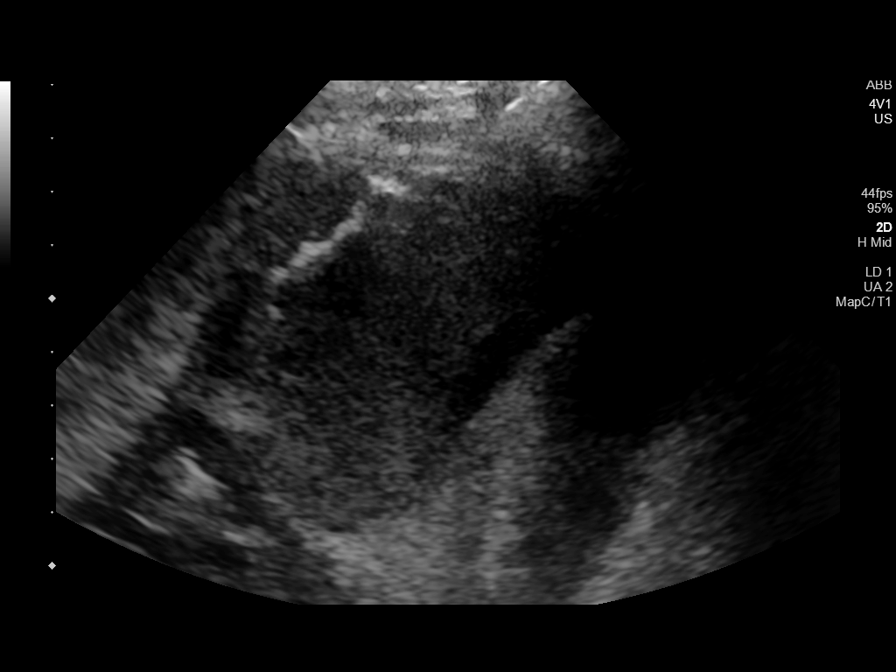
[im 7/7]
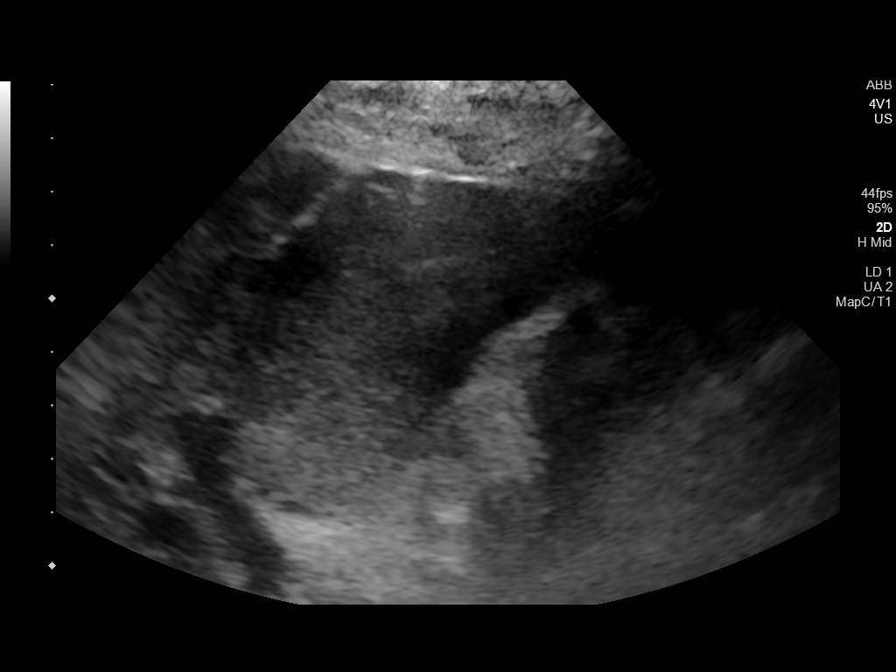

[7 of 7 positions shown; findings below may reference images not displayed]

EXAM:
ULTRASOUND GUIDED CORE BIOPSY OF LIVER MASS

MEDICATIONS:
None.

ANESTHESIA/SEDATION:
Fentanyl 150 mcg IV; Versed 3.0 mg IV

Moderate Sedation Time:  25 minutes.

The patient was continuously monitored during the procedure by the
interventional radiology nurse under my direct supervision.

PROCEDURE:
The procedure, risks, benefits, and alternatives were explained to
the patient. Questions regarding the procedure were encouraged and
answered. The patient understands and consents to the procedure. A
time-out was performed prior to initiating the procedure.

Ultrasound was performed of the liver. The abdominal wall was
prepped with chlorhexidine in a sterile fashion, and a sterile drape
was applied covering the operative field. A sterile gown and sterile
gloves were used for the procedure. Local anesthesia was provided
with 1% Lidocaine.

Under ultrasound guidance, a 17 gauge needle was advanced to the
level of a lesion within the lateral aspect of the right lobe of the
liver. After confirming needle tip position, 2 separate coaxial 18
gauge core biopsy samples were obtained and submitted in formalin.
Gel-Foam pledgets were advanced through the outer needle as the
outer needle was retracted and removed. Additional ultrasound was
performed.

COMPLICATIONS:
None immediate.
FINDINGS: Multiple hypoechoic lesions are seen throughout the liver. A lesion
measuring approximately 2.5 cm in the right lobe was chosen for
sampling. Solid and intact core biopsy samples were obtained.
IMPRESSION: Ultrasound-guided core biopsy performed of a lesion within the right
lobe of the liver.

## 2020-03-16 MED ORDER — SODIUM CHLORIDE 0.9 % IV SOLN: INTRAVENOUS | Status: DC

## 2020-03-16 MED ORDER — FENTANYL CITRATE (PF) 100 MCG/2ML IJ SOLN
INTRAMUSCULAR | Status: AC | PRN
  Administered 2020-03-16 (×3): 50 ug via INTRAVENOUS

## 2020-03-16 MED ORDER — MIDAZOLAM HCL 2 MG/2ML IJ SOLN
INTRAMUSCULAR | Status: AC | PRN
  Administered 2020-03-16 (×3): 1 mg via INTRAVENOUS

## 2020-03-16 MED ORDER — FENTANYL CITRATE (PF) 100 MCG/2ML IJ SOLN
INTRAMUSCULAR | Status: AC
  Filled 2020-03-16: qty 6

## 2020-03-16 MED ORDER — FLUMAZENIL 0.5 MG/5ML IV SOLN
INTRAVENOUS | Status: AC
  Filled 2020-03-16: qty 5

## 2020-03-16 MED ORDER — MIDAZOLAM HCL 2 MG/2ML IJ SOLN
INTRAMUSCULAR | Status: AC
  Filled 2020-03-16: qty 6

## 2020-03-16 MED ORDER — LIDOCAINE HCL 1 % IJ SOLN
INTRAMUSCULAR | Status: AC
  Filled 2020-03-16: qty 20

## 2020-03-16 MED ORDER — GELATIN ABSORBABLE 12-7 MM EX MISC
CUTANEOUS | Status: AC
  Filled 2020-03-16: qty 1

## 2020-03-16 MED ORDER — NALOXONE HCL 0.4 MG/ML IJ SOLN
INTRAMUSCULAR | Status: AC
  Filled 2020-03-16: qty 1

## 2020-03-16 NOTE — Discharge Instructions (Signed)
Liver Biopsy, Care After These instructions give you information on caring for yourself after your procedure. Your doctor may also give you more specific instructions. Call your doctor if you have any problems or questions after your procedure. What can I expect after the procedure? After the procedure, it is common to have:  Pain and soreness where the biopsy was done.  Bruising around the area where the biopsy was done.  Sleepiness and be tired for a few days. Follow these instructions at home: Medicines  Take over-the-counter and prescription medicines only as told by your doctor.  If you were prescribed an antibiotic medicine, take it as told by your doctor. Do not stop taking the antibiotic even if you start to feel better.  Do not take medicines such as aspirin and ibuprofen. These medicines can thin your blood. Do not take these medicines unless your doctor tells you to take them.  If you are taking prescription pain medicine, take actions to prevent or treat constipation. Your doctor may recommend that you: ? Drink enough fluid to keep your pee (urine) clear or pale yellow. ? Take over-the-counter or prescription medicines. ? Eat foods that are high in fiber, such as fresh fruits and vegetables, whole grains, and beans. ? Limit foods that are high in fat and processed sugars, such as fried and sweet foods. Caring for your cut  Follow instructions from your doctor about how to take care of your cuts from surgery (incisions). Make sure you: ? Wash your hands with soap and water before you change your bandage (dressing). If you cannot use soap and water, use hand sanitizer. ? Change your bandage as told by your doctor. ? Leave stitches (sutures), skin glue, or skin tape (adhesive) strips in place. They may need to stay in place for 2 weeks or longer. If tape strips get loose and curl up, you may trim the loose edges. Do not remove tape strips completely unless your doctor says it is  okay.  Check your cuts every day for signs of infection. Check for: ? Redness, swelling, or more pain. ? Fluid or blood. ? Pus or a bad smell. ? Warmth.  Do not take baths, swim, or use a hot tub until your doctor says it is okay to do so. Activity   Rest at home for 1-2 days or as told by your doctor. ? Avoid sitting for a long time without moving. Get up to take short walks every 1-2 hours.  Return to your normal activities as told by your doctor. Ask what activities are safe for you.  Do not do these things in the first 24 hours: ? Drive. ? Use machinery. ? Take a bath or shower.  Do not lift more than 10 pounds (4.5 kg) or play contact sports for the first 2 weeks. General instructions   Do not drink alcohol in the first week after the procedure.  Have someone stay with you for at least 24 hours after the procedure.  Get your test results. Ask your doctor or the department that is doing the test: ? When will my results be ready? ? How will I get my results? ? What are my treatment options? ? What other tests do I need? ? What are my next steps?  Keep all follow-up visits as told by your doctor. This is important. Contact a doctor if:  A cut bleeds and leaves more than just a small spot of blood.  A cut is red, puffs up (  swells), or hurts more than before.  Fluid or something else comes from a cut.  A cut smells bad.  You have a fever or chills. Get help right away if:  You have swelling, bloating, or pain in your belly (abdomen).  You get dizzy or faint.  You have a rash.  You feel sick to your stomach (nauseous) or throw up (vomit).  You have trouble breathing, feel short of breath, or feel faint.  Your chest hurts.  You have problems talking or seeing.  You have trouble with your balance or moving your arms or legs. Summary  After the procedure, it is common to have pain, soreness, bruising, and tiredness.  Your doctor will tell you how to  take care of yourself at home. Change your bandage, take your medicines, and limit your activities as told by your doctor.  Call your doctor if you have symptoms of infection. Get help right away if your belly swells, your cut bleeds a lot, or you have trouble talking or breathing. This information is not intended to replace advice given to you by your health care provider. Make sure you discuss any questions you have with your health care provider. Document Revised: 12/14/2017 Document Reviewed: 12/14/2017 Elsevier Patient Education  2020 [employer] Inc. Moderate Conscious Sedation, Adult, Care After These instructions provide you with information about caring for yourself after your procedure. Your health care provider may also give you more specific instructions. Your treatment has been planned according to current medical practices, but problems sometimes occur. Call your health care provider if you have any problems or questions after your procedure. What can I expect after the procedure? After your procedure, it is common:  To feel sleepy for several hours.  To feel clumsy and have poor balance for several hours.  To have poor judgment for several hours.  To vomit if you eat too soon. Follow these instructions at home: For at least 24 hours after the procedure:   Do not: ? Participate in activities where you could fall or become injured. ? Drive. ? Use heavy machinery. ? Drink alcohol. ? Take sleeping pills or medicines that cause drowsiness. ? Make important decisions or sign legal documents. ? Take care of children on your own.  Rest. Eating and drinking  Follow the diet recommended by your health care provider.  If you vomit: ? Drink water, juice, or soup when you can drink without vomiting. ? Make sure you have little or no nausea before eating solid foods. General instructions  Have a responsible adult stay with you until you are awake and alert.  Take  over-the-counter and prescription medicines only as told by your health care provider.  If you smoke, do not smoke without supervision.  Keep all follow-up visits as told by your health care provider. This is important. Contact a health care provider if:  You keep feeling nauseous or you keep vomiting.  You feel light-headed.  You develop a rash.  You have a fever. Get help right away if:  You have trouble breathing. This information is not intended to replace advice given to you by your health care provider. Make sure you discuss any questions you have with your health care provider. Document Revised: 11/16/2017 Document Reviewed: 03/25/2016 Elsevier Patient Education  2020 [employer] Inc.  

## 2020-03-16 NOTE — Consult Note (Signed)
Chief Complaint: Patient was seen in consultation today for image guided liver lesion biopsy  Referring Physician(s): Ennever,Peter R  Supervising Physician: Aletta Edouard  Patient Status: [facility] - Out-pt  History of Present Illness: Melvin [name] is a 59 y.o. male with history of diabetes, hypercholesterolemia, hypertension, prior tobacco abuse and recent imaging work-up of abdominal/chest discomfort revealing:  1. No definite evidence of pulmonary embolus. 2. Innumerable small nodules are noted throughout both lungs concerning for diffuse metastatic disease or less likely atypical infection. 3. 8.3 x 3.0 cm ill-defined mass is seen involving pancreatic tail which extends into the splenic hilum, and this is concerning for pancreatic malignancy. 4. Multiple hepatic metastases are noted. Mild right intrahepatic biliary dilatation is noted  CA 19-9 on 03/11/2020 was 107,932.  Melvin [name] presents today for image guided liver lesion biopsy for further evaluation.  Past Medical History:  Diagnosis Date  . Diabetes mellitus without complication ([place])   . High cholesterol   . Hypertension   . Pancreatic cancer metastasized to liver ([place]) 03/12/2020  . Pancreatic cancer metastasized to lung ([place]) 03/12/2020    No past surgical history on file.  Allergies: Patient has no known allergies.  Medications: Prior to Admission medications   Medication Sig Start Date End Date Taking? Authorizing Provider  atorvastatin (LIPITOR) 10 MG tablet Take 1 tablet (10 mg total) by mouth daily. 10/31/19   Emeterio Reeve, DO  diazepam (VALIUM) 5 MG tablet Take 0.5-1 tablets (2.5-5 mg total) by mouth every 8 (eight) hours as needed for anxiety. 03/08/20   Emeterio Reeve, DO  HYDROcodone-acetaminophen (NORCO/VICODIN) 5-325 MG tablet Take 1-2 tablets by mouth every 6 (six) hours as needed for moderate pain or severe pain. 03/08/20   Emeterio Reeve, DO  HYDROcodone-homatropine [facility]) 5-1.5 MG/5ML  syrup Take 5 mLs by mouth every 6 (six) hours as needed for cough. 03/11/20   Volanda Napoleon, MD  insulin aspart (NOVOLOG FLEXPEN) 100 UNIT/ML FlexPen Inject 15 Units into the skin 3 (three) times daily with meals. 02/16/20   Emeterio Reeve, DO  insulin glargine, 2 Unit Dial, (TOUJEO MAX SOLOSTAR) 300 UNIT/ML Solostar Pen Inject 25-100 Units into the skin daily. Increase by 5 units at a time, twice per week, to fasting blood sugar 100-150 02/27/20   Emeterio Reeve, DO  Insulin Pen Needle (PEN NEEDLES) 30G X 8 MM MISC 1 each by Does not apply route as directed. 02/16/20   Emeterio Reeve, DO  losartan (COZAAR) 25 MG tablet Take 1 tablet (25 mg total) by mouth daily. 10/31/19   Emeterio Reeve, DO  metFORMIN (GLUCOPHAGE) 1000 MG tablet Take 1 tablet (1,000 mg total) by mouth 2 (two) times daily with a meal. 10/31/19   Emeterio Reeve, DO  metoCLOPramide (REGLAN) 10 MG tablet Take 0.5 tablets (5 mg total) by mouth as needed for nausea (nausea/reflux). 02/16/20   Emeterio Reeve, DO  ondansetron (ZOFRAN-ODT) 8 MG disintegrating tablet Take 1 tablet (8 mg total) by mouth every 8 (eight) hours as needed for nausea. 03/08/20   Emeterio Reeve, DO     No family history on file.  Social History   Socioeconomic History  . Marital status: Married    Spouse name: Not on file  . Number of children: Not on file  . Years of education: Not on file  . Highest education level: Not on file  Occupational History  . Not on file  Tobacco Use  . Smoking status: Former Smoker    Types: Cigarettes    Quit date:  03/11/1986    Years since quitting: 34.0  . Smokeless tobacco: Never Used  . Tobacco comment: Quit 1987  Substance and Sexual Activity  . Alcohol use: Not on file    Comment: occ  . Drug use: Not on file  . Sexual activity: Not on file  Other Topics Concern  . Not on file  Social History Narrative  . Not on file   Social Determinants of Health   Financial Resource Strain:   .  Difficulty of Paying Living Expenses:   Food Insecurity:   . Worried About Charity fundraiser in the Last Year:   . Arboriculturist in the Last Year:   Transportation Needs:   . Film/video editor (Medical):   Marland Kitchen Lack of Transportation (Non-Medical):   Physical Activity:   . Days of Exercise per Week:   . Minutes of Exercise per Session:   Stress:   . Feeling of Stress :   Social Connections:   . Frequency of Communication with Friends and Family:   . Frequency of Social Gatherings with Friends and Family:   . Attends Religious Services:   . Active Member of Clubs or Organizations:   . Attends Archivist Meetings:   Marland Kitchen Marital Status:       Review of Systems  See above; Melvin [name] currently denies fever, headache, back pain, vomiting or abnormal bleeding.  Melvin [name] does have abdominal discomfort, dyspnea with exertion, nausea, weight loss and constipation  Vital Signs: BP (!) 144/112   Pulse (!) 114   Temp 97.9 F (36.6 C) (Oral)   Resp 18   SpO2 100%   Physical Exam awake, alert.  Chest clear to auscultation bilaterally.  Heart with tachycardic but regular rhythm.  Abdomen soft, positive bowel sounds, some mild generalized tenderness to palpation.  No lower extremity edema.  Imaging: CT ANGIO CHEST PE W OR WO CONTRAST  Result Date: 03/08/2020 CLINICAL DATA:  Chest pain, generalized abdominal pain for 1 month. EXAM: CT ANGIOGRAPHY CHEST CT ABDOMEN AND PELVIS WITH CONTRAST TECHNIQUE: Multidetector CT imaging of the chest was performed using the standard protocol during bolus administration of intravenous contrast. Multiplanar CT image reconstructions and MIPs were obtained to evaluate the vascular anatomy. Multidetector CT imaging of the abdomen and pelvis was performed using the standard protocol during bolus administration of intravenous contrast. CONTRAST:  149mL ISOVUE-370 IOPAMIDOL (ISOVUE-370) INJECTION 76% COMPARISON:  None. FINDINGS: CTA CHEST FINDINGS Cardiovascular:  Satisfactory opacification of the pulmonary arteries to the segmental level. No evidence of pulmonary embolism. Normal heart size. No pericardial effusion. Mediastinum/Nodes: No enlarged mediastinal, hilar, or axillary lymph nodes. Thyroid gland, trachea, and esophagus demonstrate no significant findings. Lungs/Pleura: No pneumothorax or pleural effusion is noted. Innumerable small nodules are noted throughout both lungs concerning for diffuse metastatic disease or less likely atypical infection. Musculoskeletal: No chest wall abnormality. No acute or significant osseous findings. Review of the MIP images confirms the above findings. CT ABDOMEN and PELVIS FINDINGS Hepatobiliary: No gallstones are noted. Mild right intrahepatic biliary dilatation is noted. Multiple rounded low densities are noted in the hepatic parenchyma consistent with metastatic disease. The largest measures 3.3 cm in posterior segment of right hepatic lobe. Pancreas: Ill-defined mass measuring 8.3 x 3.0 cm is seen involving pancreatic tail which extends into the splenic hilum, and this is concerning for pancreatic malignancy. No ductal dilatation is noted. Spleen: The spleen is self is unremarkable, although pancreatic mass is seen extending into the hilum. Adrenals/Urinary Tract: Adrenal  glands are unremarkable. Kidneys are normal, without renal calculi, focal lesion, or hydronephrosis. Bladder is unremarkable. Stomach/Bowel: Stomach is within normal limits. Appendix appears normal. No evidence of bowel wall thickening, distention, or inflammatory changes. Vascular/Lymphatic: No significant vascular findings are present. No enlarged abdominal or pelvic lymph nodes. Reproductive: Prostate is unremarkable. Other: No abdominal wall hernia or abnormality. No abdominopelvic ascites. Musculoskeletal: No acute or significant osseous findings. Review of the MIP images confirms the above findings. IMPRESSION: 1. No definite evidence of pulmonary embolus.  2. Innumerable small nodules are noted throughout both lungs concerning for diffuse metastatic disease or less likely atypical infection. 3. 8.3 x 3.0 cm ill-defined mass is seen involving pancreatic tail which extends into the splenic hilum, and this is concerning for pancreatic malignancy. 4. Multiple hepatic metastases are noted. Mild right intrahepatic biliary dilatation is noted. These results will be called to the ordering clinician or representative by the Radiologist Assistant, and communication documented in the PACS or zVision Dashboard. Electronically Signed   By: Marijo Conception M.D.   On: 03/08/2020 15:26   CT ABDOMEN PELVIS W CONTRAST  Result Date: 03/08/2020 CLINICAL DATA:  Chest pain, generalized abdominal pain for 1 month. EXAM: CT ANGIOGRAPHY CHEST CT ABDOMEN AND PELVIS WITH CONTRAST TECHNIQUE: Multidetector CT imaging of the chest was performed using the standard protocol during bolus administration of intravenous contrast. Multiplanar CT image reconstructions and MIPs were obtained to evaluate the vascular anatomy. Multidetector CT imaging of the abdomen and pelvis was performed using the standard protocol during bolus administration of intravenous contrast. CONTRAST:  166mL ISOVUE-370 IOPAMIDOL (ISOVUE-370) INJECTION 76% COMPARISON:  None. FINDINGS: CTA CHEST FINDINGS Cardiovascular: Satisfactory opacification of the pulmonary arteries to the segmental level. No evidence of pulmonary embolism. Normal heart size. No pericardial effusion. Mediastinum/Nodes: No enlarged mediastinal, hilar, or axillary lymph nodes. Thyroid gland, trachea, and esophagus demonstrate no significant findings. Lungs/Pleura: No pneumothorax or pleural effusion is noted. Innumerable small nodules are noted throughout both lungs concerning for diffuse metastatic disease or less likely atypical infection. Musculoskeletal: No chest wall abnormality. No acute or significant osseous findings. Review of the MIP images  confirms the above findings. CT ABDOMEN and PELVIS FINDINGS Hepatobiliary: No gallstones are noted. Mild right intrahepatic biliary dilatation is noted. Multiple rounded low densities are noted in the hepatic parenchyma consistent with metastatic disease. The largest measures 3.3 cm in posterior segment of right hepatic lobe. Pancreas: Ill-defined mass measuring 8.3 x 3.0 cm is seen involving pancreatic tail which extends into the splenic hilum, and this is concerning for pancreatic malignancy. No ductal dilatation is noted. Spleen: The spleen is self is unremarkable, although pancreatic mass is seen extending into the hilum. Adrenals/Urinary Tract: Adrenal glands are unremarkable. Kidneys are normal, without renal calculi, focal lesion, or hydronephrosis. Bladder is unremarkable. Stomach/Bowel: Stomach is within normal limits. Appendix appears normal. No evidence of bowel wall thickening, distention, or inflammatory changes. Vascular/Lymphatic: No significant vascular findings are present. No enlarged abdominal or pelvic lymph nodes. Reproductive: Prostate is unremarkable. Other: No abdominal wall hernia or abnormality. No abdominopelvic ascites. Musculoskeletal: No acute or significant osseous findings. Review of the MIP images confirms the above findings. IMPRESSION: 1. No definite evidence of pulmonary embolus. 2. Innumerable small nodules are noted throughout both lungs concerning for diffuse metastatic disease or less likely atypical infection. 3. 8.3 x 3.0 cm ill-defined mass is seen involving pancreatic tail which extends into the splenic hilum, and this is concerning for pancreatic malignancy. 4. Multiple hepatic metastases are noted.  Mild right intrahepatic biliary dilatation is noted. These results will be called to the ordering clinician or representative by the Radiologist Assistant, and communication documented in the PACS or zVision Dashboard. Electronically Signed   By: Marijo Conception M.D.   On:  03/08/2020 15:26    Labs:  CBC: Recent Labs    02/16/20 1342 03/08/20 1058 03/11/20 1510  WBC 9.8 9.0 10.2  HGB 13.2 12.9* 12.6*  HCT 39.9 40.2 39.6  PLT 270 321 304    COAGS: No results for input(s): INR, APTT in the last 8760 hours.  BMP: Recent Labs    08/04/19 0924 02/16/20 1342 03/08/20 1058 03/11/20 1510  NA 138 133* 135 135  K 4.6 4.8 4.7 4.4  CL 101 96* 98 95*  CO2 28 27 29 30   GLUCOSE 240* 390* 386* 413*  BUN 15 17 18 20   CALCIUM 9.5 9.1 9.5 9.7  CREATININE 0.70 0.68* 0.75 0.96  GFRNONAA 105 106 102 >60  GFRAA 122 123 118 >60    LIVER FUNCTION TESTS: Recent Labs    08/04/19 0924 02/16/20 1342 03/08/20 1058 03/11/20 1510  BILITOT 0.4 0.7 0.9 1.3*  AST 15 42* 73* 63*  ALT 26 72* 83* 86*  ALKPHOS  --   --   --  1,038*  PROT 6.6 7.0 6.9 7.2  ALBUMIN  --   --   --  3.7    TUMOR MARKERS: No results for input(s): AFPTM, CEA, CA199, CHROMGRNA in the last 8760 hours.  Assessment and Plan: 59 y.o. male with history of diabetes, hypercholesterolemia, hypertension, prior tobacco abuse and recent imaging work-up of abdominal/chest discomfort revealing:  1. No definite evidence of pulmonary embolus. 2. Innumerable small nodules are noted throughout both lungs concerning for diffuse metastatic disease or less likely atypical infection. 3. 8.3 x 3.0 cm ill-defined mass is seen involving pancreatic tail which extends into the splenic hilum, and this is concerning for pancreatic malignancy. 4. Multiple hepatic metastases are noted. Mild right intrahepatic biliary dilatation is noted  CA 19-9 on 03/11/2020 was 107,932.  Melvin [name] presents today for image guided liver lesion biopsy for further evaluation.Risks and benefits of procedure was discussed with the patient  including, but not limited to bleeding, infection, damage to adjacent structures or low yield requiring additional tests.  All of the questions were answered and there is agreement to  proceed.  Consent signed and in chart.     Thank you for this interesting consult.  I greatly enjoyed meeting Melvin [name] and look forward to participating in their care.  A copy of this report was sent to the requesting provider on this date.  Electronically Signed: D. Rowe Robert, PA-C 03/16/2020, 11:44 AM   I spent a total of  25 minutes   in face to face in clinical consultation, greater than 50% of which was counseling/coordinating care for image guided liver lesion biopsy

## 2020-03-16 NOTE — Procedures (Signed)
Interventional Radiology Procedure Note  Procedure: US Guided Biopsy of liver  Complications: None  Estimated Blood Loss: < 10 mL  Findings: 18 G core biopsy of right lobe liver lesion performed under US guidance.  Two core samples obtained and sent to Pathology.  Venetia Night. Kathlene Cote, M.D Pager:  [phone]

## 2020-03-16 NOTE — Progress Notes (Signed)
Pharmacist Chemotherapy Monitoring - Initial Assessment    Anticipated start date: 03/23/20   Regimen:  . Are orders appropriate based on the patient's diagnosis, regimen, and cycle? Yes . Does the plan date match the patient's scheduled date? Yes . Is the sequencing of drugs appropriate? Yes . Are the premedications appropriate for the patient's regimen? Yes . Prior Authorization for treatment is: Uninsured o If applicable, is the correct biosimilar selected based on the patient's insurance? not applicable  Organ Function and Labs: Marland Kitchen Are dose adjustments needed based on the patient's renal function, hepatic function, or hematologic function? No . Are appropriate labs ordered prior to the start of patient's treatment? Yes . Other organ system assessment, if indicated: N/A . The following baseline labs, if indicated, have been ordered: N/A  Dose Assessment: . Are the drug doses appropriate? Yes . Are the following correct: o Drug concentrations Yes o IV fluid compatible with drug Yes2 o Administration routes Yes o Timing of therapy Yes . If applicable, does the patient have documented access for treatment and/or plans for port-a-cath placement? yes . If applicable, have lifetime cumulative doses been properly documented and assessed? not applicable Lifetime Dose Tracking  No doses have been documented on this patient for the following tracked chemicals: Doxorubicin, Epirubicin, Idarubicin, Daunorubicin, Mitoxantrone, Bleomycin, Oxaliplatin, Carboplatin, Liposomal Doxorubicin  o   Toxicity Monitoring/Prevention: . The patient has the following take home antiemetics prescribed: ondansetron, prochlorperazine, lorazepam, dexamethasone  . The patient has the following take home medications prescribed: diarrhea prophylaxis . Medication allergies and previous infusion related reactions, if applicable, have been reviewed and addressed. No . The patient's current medication list has been  assessed for drug-drug interactions with their chemotherapy regimen. no significant drug-drug interactions were identified on review.  Order Review: . Are the treatment plan orders signed? No . Is the patient scheduled to see a provider prior to their treatment? Yes  I verify that I have reviewed each item in the above checklist and answered each question accordingly.  Francene Mcerlean, [name] 03/16/2020 7:50 AM

## 2020-03-17 ENCOUNTER — Encounter: Payer: Self-pay | Admitting: *Deleted

## 2020-03-17 NOTE — Progress Notes (Signed)
Patient c/o nausea and vomiting after his biopsy yesterday. He says he started coughing and spit up a small amount of phlegm. His abdomen also hurt after this episode. He hasn't taken any cough or nausea medicine.  The wife then got on the phone and reviewed his after care instructions from the biopsy. It stated to call right away for n/v and/or abdominal pain. I instructed them to call IR, gave them the number, and review with them the symptoms. If they ruled out anything of concern, I instructed them for patient to take a dose of his cough medicine and some zofran to see if that helped his symptoms. They agreed with plan.

## 2020-03-18 ENCOUNTER — Other Ambulatory Visit: Payer: Self-pay | Admitting: Student

## 2020-03-18 LAB — SURGICAL PATHOLOGY

## 2020-03-19 ENCOUNTER — Encounter: Payer: Self-pay | Admitting: *Deleted

## 2020-03-19 ENCOUNTER — Encounter (HOSPITAL_COMMUNITY): Payer: Self-pay

## 2020-03-19 ENCOUNTER — Other Ambulatory Visit: Payer: Self-pay | Admitting: *Deleted

## 2020-03-19 ENCOUNTER — Ambulatory Visit (HOSPITAL_COMMUNITY)
Admission: RE | Admit: 2020-03-19 | Discharge: 2020-03-19 | Disposition: A | Discharge status: Home / Self Care | Payer: PRIVATE HEALTH INSURANCE | Source: Ambulatory Visit | Attending: Hematology & Oncology | Admitting: Hematology & Oncology

## 2020-03-19 ENCOUNTER — Other Ambulatory Visit: Payer: Self-pay | Admitting: Hematology & Oncology

## 2020-03-19 ENCOUNTER — Other Ambulatory Visit: Payer: Self-pay

## 2020-03-19 DIAGNOSIS — Z794 Long term (current) use of insulin: Secondary | ICD-10-CM | POA: Diagnosis not present

## 2020-03-19 DIAGNOSIS — I10 Essential (primary) hypertension: Secondary | ICD-10-CM | POA: Insufficient documentation

## 2020-03-19 DIAGNOSIS — Z79899 Other long term (current) drug therapy: Secondary | ICD-10-CM | POA: Insufficient documentation

## 2020-03-19 DIAGNOSIS — C7800 Secondary malignant neoplasm of unspecified lung: Secondary | ICD-10-CM | POA: Diagnosis not present

## 2020-03-19 DIAGNOSIS — C787 Secondary malignant neoplasm of liver and intrahepatic bile duct: Secondary | ICD-10-CM | POA: Diagnosis not present

## 2020-03-19 DIAGNOSIS — Z7982 Long term (current) use of aspirin: Secondary | ICD-10-CM | POA: Diagnosis not present

## 2020-03-19 DIAGNOSIS — E119 Type 2 diabetes mellitus without complications: Secondary | ICD-10-CM | POA: Insufficient documentation

## 2020-03-19 DIAGNOSIS — E7800 Pure hypercholesterolemia, unspecified: Secondary | ICD-10-CM | POA: Insufficient documentation

## 2020-03-19 DIAGNOSIS — C259 Malignant neoplasm of pancreas, unspecified: Secondary | ICD-10-CM | POA: Diagnosis present

## 2020-03-19 DIAGNOSIS — K8689 Other specified diseases of pancreas: Secondary | ICD-10-CM

## 2020-03-19 HISTORY — PX: IR IMAGING GUIDED PORT INSERTION: IMG5740

## 2020-03-19 LAB — GLUCOSE, CAPILLARY
Glucose-Capillary: 294 mg/dL — ABNORMAL HIGH (ref 70–99)
Glucose-Capillary: 154 mg/dL — ABNORMAL HIGH (ref 70–99)

## 2020-03-19 IMAGING — US IR IMAGING GUIDED PORT INSERTION
1 series · 1 of 1 positions shown · non-contrast
Comparison: None.

INDICATION: 58-year-old with metastatic pancreatic cancer. Port-A-Cath needed
for treatment.

EXAM:
FLUOROSCOPIC AND ULTRASOUND GUIDED PLACEMENT OF A SUBCUTANEOUS PORT

[Series 1: (id) · 1 of 1 slices shown]
[im 1/1]
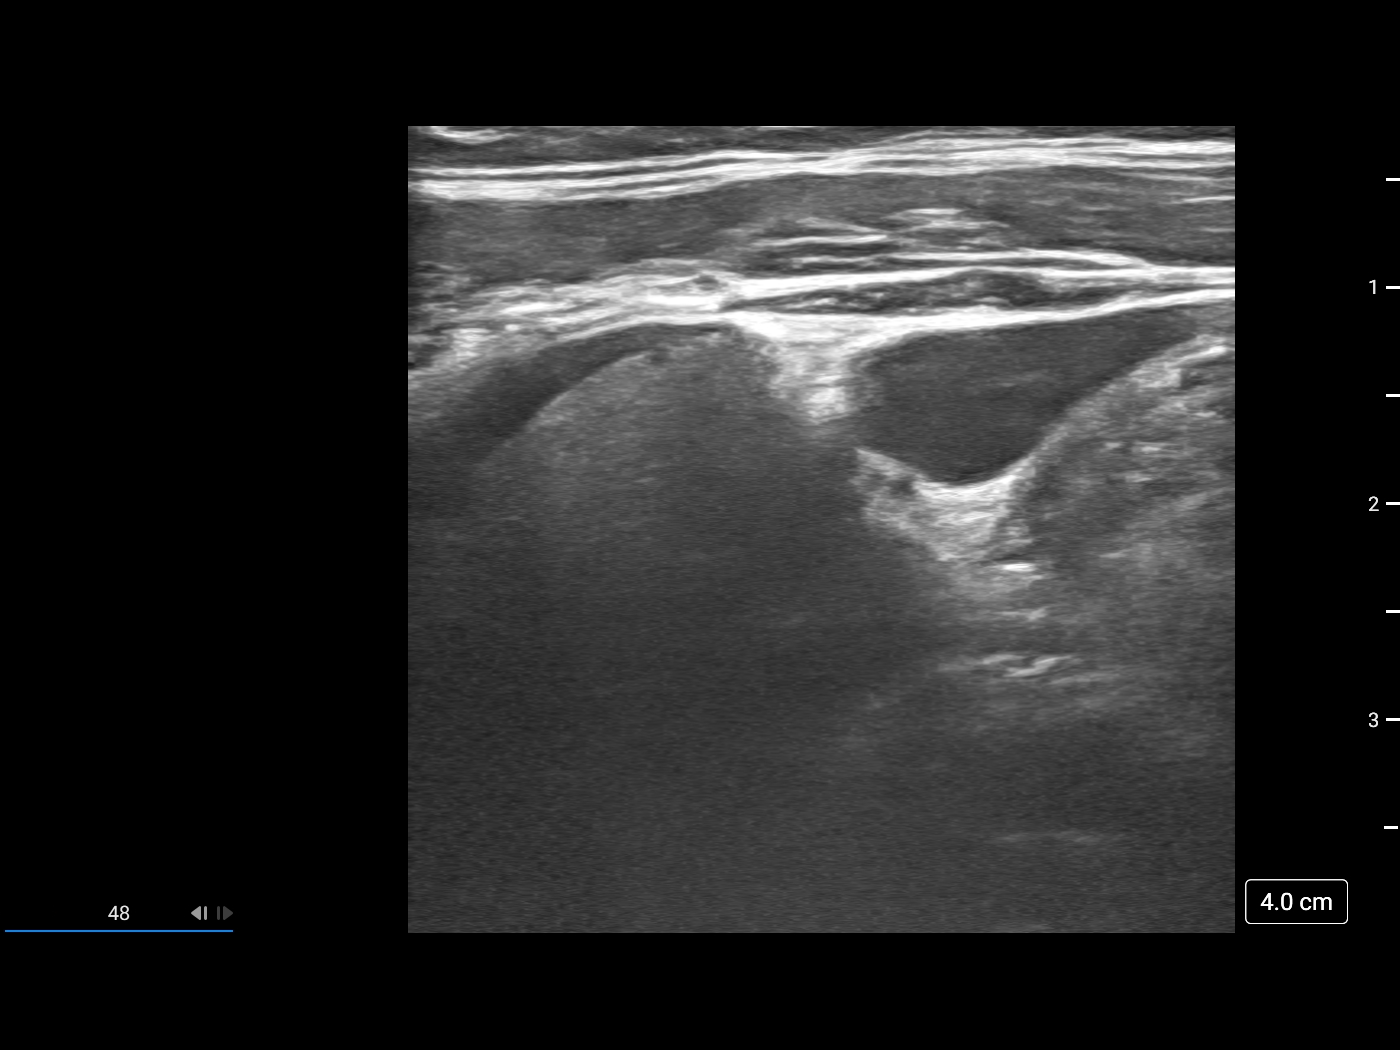

[1 of 1 positions shown; findings below may reference images not displayed]

MEDICATIONS:
Ancef 2 g; The antibiotic was administered within an appropriate
time interval prior to skin puncture.

ANESTHESIA/SEDATION:
Versed 4.0 mg IV; Fentanyl 200 mcg IV;

Moderate Sedation Time:  44 minutes

The patient was continuously monitored during the procedure by the
interventional radiology nurse under my direct supervision.

FLUOROSCOPY TIME:  12 seconds, 6 mGy

COMPLICATIONS:
None immediate.

PROCEDURE:
The procedure, risks, benefits, and alternatives were explained to
the patient. Questions regarding the procedure were encouraged and
answered. The patient understands and consents to the procedure.

Patient was placed supine on the interventional table. Ultrasound
confirmed a patent right internal jugular vein. Ultrasound image was
saved for documentation. The right chest and neck were cleaned with
a skin antiseptic and a sterile drape was placed. Maximal barrier
sterile technique was utilized including caps, mask, sterile gowns,
sterile gloves, sterile drape, hand hygiene and skin antiseptic. The
right neck was anesthetized with 1% lidocaine. Small incision was
made in the right neck with a blade. Micropuncture set was placed in
the right internal jugular vein with ultrasound guidance. The
micropuncture wire was used for measurement purposes. The right
chest was anesthetized with 1% lidocaine with epinephrine. #15 blade
was used to make an incision and a subcutaneous port pocket was
formed. 8 french Power Port was assembled. Subcutaneous tunnel was
formed with a stiff tunneling device. The port catheter was brought
through the subcutaneous tunnel. The port was placed in the
subcutaneous pocket. The micropuncture set was exchanged for a
peel-away sheath. The catheter was placed through the peel-away
sheath and the tip was positioned at the SVC and right atrium
junction. Catheter placement was confirmed with fluoroscopy. The
port was accessed and flushed with heparinized saline. The port
pocket was closed using two layers of absorbable sutures and
Dermabond. The vein skin site was closed using a single layer of
absorbable suture and Dermabond. Sterile dressings were applied.
Patient tolerated the procedure well without an immediate
complication. Ultrasound and fluoroscopic images were taken and
saved for this procedure.
IMPRESSION: Placement of a subcutaneous port device. Catheter tip at the SVC and
right atrium junction.

## 2020-03-19 IMAGING — XA IR IMAGING GUIDED PORT INSERTION
1 series · 2 of 2 positions shown · IV contrast (IODINE)
Comparison: None.

INDICATION: 58-year-old with metastatic pancreatic cancer. Port-A-Cath needed
for treatment.

EXAM:
FLUOROSCOPIC AND ULTRASOUND GUIDED PLACEMENT OF A SUBCUTANEOUS PORT

[Series 1: care body 4 · 2 of 2 frames shown]
[frame 1/2]
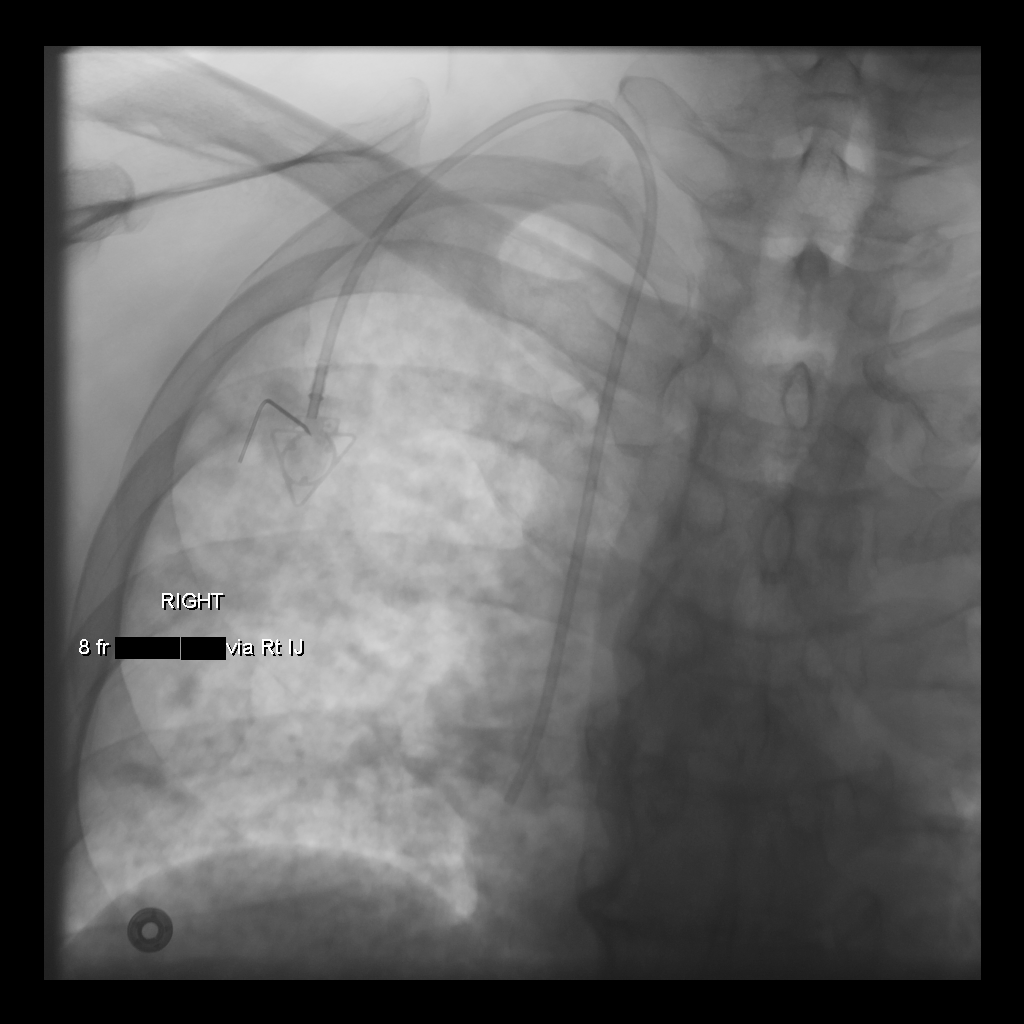
[frame 2/2]
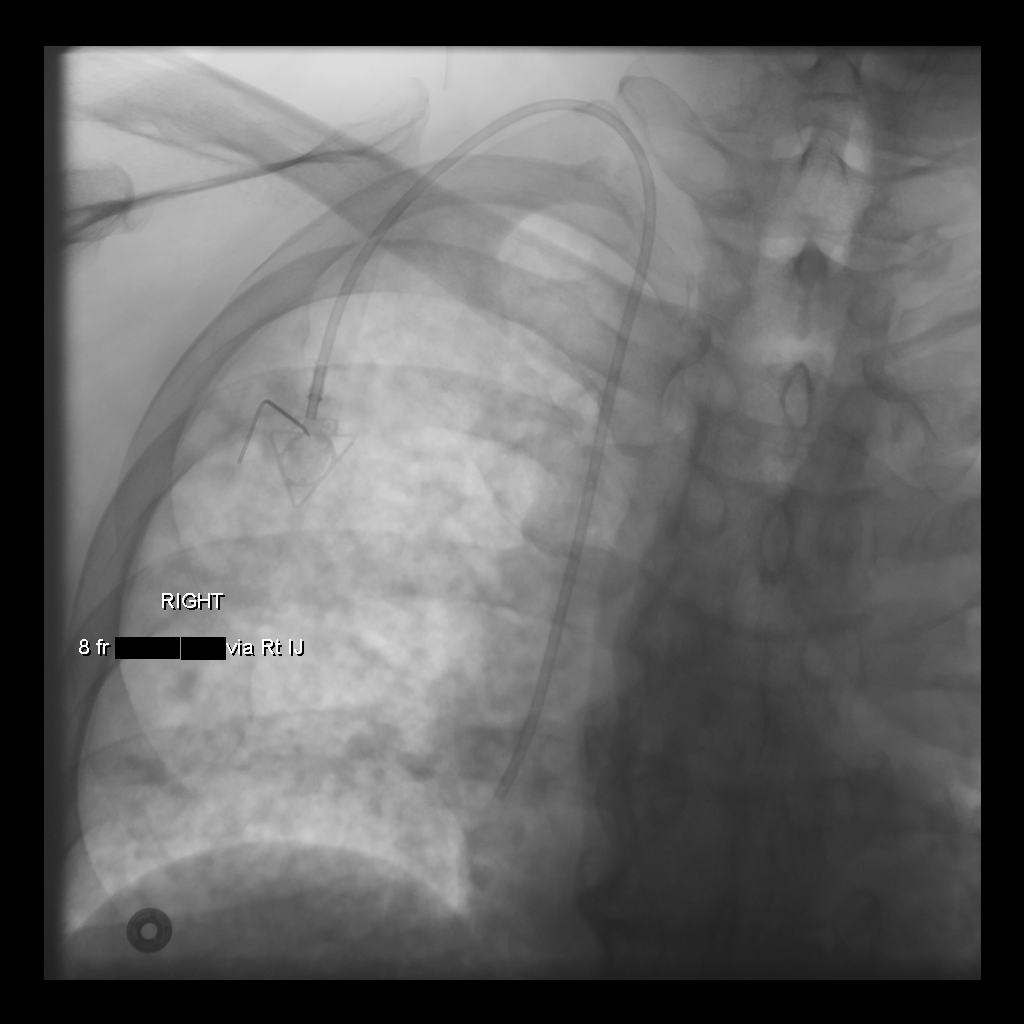

[2 of 2 positions shown; findings below may reference images not displayed]

MEDICATIONS:
Ancef 2 g; The antibiotic was administered within an appropriate
time interval prior to skin puncture.

ANESTHESIA/SEDATION:
Versed 4.0 mg IV; Fentanyl 200 mcg IV;

Moderate Sedation Time:  44 minutes

The patient was continuously monitored during the procedure by the
interventional radiology nurse under my direct supervision.

FLUOROSCOPY TIME:  12 seconds, 6 mGy

COMPLICATIONS:
None immediate.

PROCEDURE:
The procedure, risks, benefits, and alternatives were explained to
the patient. Questions regarding the procedure were encouraged and
answered. The patient understands and consents to the procedure.

Patient was placed supine on the interventional table. Ultrasound
confirmed a patent right internal jugular vein. Ultrasound image was
saved for documentation. The right chest and neck were cleaned with
a skin antiseptic and a sterile drape was placed. Maximal barrier
sterile technique was utilized including caps, mask, sterile gowns,
sterile gloves, sterile drape, hand hygiene and skin antiseptic. The
right neck was anesthetized with 1% lidocaine. Small incision was
made in the right neck with a blade. Micropuncture set was placed in
the right internal jugular vein with ultrasound guidance. The
micropuncture wire was used for measurement purposes. The right
chest was anesthetized with 1% lidocaine with epinephrine. #15 blade
was used to make an incision and a subcutaneous port pocket was
formed. 8 french Power Port was assembled. Subcutaneous tunnel was
formed with a stiff tunneling device. The port catheter was brought
through the subcutaneous tunnel. The port was placed in the
subcutaneous pocket. The micropuncture set was exchanged for a
peel-away sheath. The catheter was placed through the peel-away
sheath and the tip was positioned at the SVC and right atrium
junction. Catheter placement was confirmed with fluoroscopy. The
port was accessed and flushed with heparinized saline. The port
pocket was closed using two layers of absorbable sutures and
Dermabond. The vein skin site was closed using a single layer of
absorbable suture and Dermabond. Sterile dressings were applied.
Patient tolerated the procedure well without an immediate
complication. Ultrasound and fluoroscopic images were taken and
saved for this procedure.
IMPRESSION: Placement of a subcutaneous port device. Catheter tip at the SVC and
right atrium junction.

## 2020-03-19 MED ORDER — LORAZEPAM 0.5 MG PO TABS: 0.5000 mg | ORAL_TABLET | Freq: Three times a day (TID) | ORAL | 0 refills | Status: DC

## 2020-03-19 MED ORDER — LOPERAMIDE HCL 2 MG PO CAPS: 2.0000 mg | ORAL_CAPSULE | ORAL | 1 refills | Status: AC | PRN

## 2020-03-19 MED ORDER — CEFAZOLIN SODIUM-DEXTROSE 2-4 GM/100ML-% IV SOLN
INTRAVENOUS | Status: AC
  Filled 2020-03-19: qty 100

## 2020-03-19 MED ORDER — FLUMAZENIL 0.5 MG/5ML IV SOLN
INTRAVENOUS | Status: AC
  Filled 2020-03-19: qty 5

## 2020-03-19 MED ORDER — LIDOCAINE-PRILOCAINE 2.5-2.5 % EX CREA: 1.0000 "application " | TOPICAL_CREAM | CUTANEOUS | 0 refills | Status: AC | PRN

## 2020-03-19 MED ORDER — NALOXONE HCL 0.4 MG/ML IJ SOLN
INTRAMUSCULAR | Status: AC
  Filled 2020-03-19: qty 1

## 2020-03-19 MED ORDER — LIDOCAINE-EPINEPHRINE (PF) 1 %-1:200000 IJ SOLN
INTRAMUSCULAR | Status: AC | PRN
  Administered 2020-03-19: 10 mL

## 2020-03-19 MED ORDER — LIDOCAINE-EPINEPHRINE 1 %-1:100000 IJ SOLN
INTRAMUSCULAR | Status: AC
  Filled 2020-03-19: qty 1

## 2020-03-19 MED ORDER — SODIUM CHLORIDE 0.9 % IV SOLN: INTRAVENOUS | Status: DC

## 2020-03-19 MED ORDER — LIDOCAINE HCL 1 % IJ SOLN
INTRAMUSCULAR | Status: AC | PRN
  Administered 2020-03-19: 10 mL via INTRADERMAL

## 2020-03-19 MED ORDER — MIDAZOLAM HCL 2 MG/2ML IJ SOLN
INTRAMUSCULAR | Status: AC
  Filled 2020-03-19: qty 6

## 2020-03-19 MED ORDER — ONDANSETRON HCL 8 MG PO TABS: 8.0000 mg | ORAL_TABLET | Freq: Two times a day (BID) | ORAL | 1 refills | Status: DC

## 2020-03-19 MED ORDER — CEFAZOLIN SODIUM-DEXTROSE 2-4 GM/100ML-% IV SOLN
2.0000 g | Freq: Once | INTRAVENOUS | Status: AC
  Administered 2020-03-19: 09:00:00 2 g via INTRAVENOUS

## 2020-03-19 MED ORDER — FENTANYL CITRATE (PF) 100 MCG/2ML IJ SOLN
INTRAMUSCULAR | Status: AC
  Filled 2020-03-19: qty 6

## 2020-03-19 MED ORDER — PROCHLORPERAZINE MALEATE 10 MG PO TABS: 10.0000 mg | ORAL_TABLET | Freq: Four times a day (QID) | ORAL | 0 refills | Status: DC | PRN

## 2020-03-19 MED ORDER — LIDOCAINE HCL 1 % IJ SOLN
INTRAMUSCULAR | Status: AC
  Filled 2020-03-19: qty 20

## 2020-03-19 MED ORDER — DEXAMETHASONE 4 MG PO TABS: 8.0000 mg | ORAL_TABLET | Freq: Every day | ORAL | 5 refills | Status: DC

## 2020-03-19 MED ORDER — MIDAZOLAM HCL 2 MG/2ML IJ SOLN
INTRAMUSCULAR | Status: AC | PRN
  Administered 2020-03-19 (×4): 1 mg via INTRAVENOUS

## 2020-03-19 MED ORDER — HEPARIN SOD (PORK) LOCK FLUSH 100 UNIT/ML IV SOLN
INTRAVENOUS | Status: AC
  Filled 2020-03-19: qty 5

## 2020-03-19 MED ORDER — FENTANYL CITRATE (PF) 100 MCG/2ML IJ SOLN
INTRAMUSCULAR | Status: AC | PRN
  Administered 2020-03-19 (×4): 50 ug via INTRAVENOUS

## 2020-03-19 NOTE — Procedures (Signed)
Interventional Radiology Procedure:   Indications: Metastatic pancreatic cancer  Procedure: Port placement  Findings: Right jugular port, tip at SVC/RA junction  Complications: None     EBL: Minimal, less than 10 ml  Plan: Discharge in one hour.  Keep port site and incisions dry for at least 24 hours.     Gareld Obrecht R. Kourtnie Sachs, MD  Pager: [phone]    

## 2020-03-19 NOTE — Discharge Instructions (Signed)
Moderate Conscious Sedation, Adult, Care After These instructions provide you with information about caring for yourself after your procedure. Your health care provider may also give you more specific instructions. Your treatment has been planned according to current medical practices, but problems sometimes occur. Call your health care provider if you have any problems or questions after your procedure. What can I expect after the procedure? After your procedure, it is common:  To feel sleepy for several hours.  To feel clumsy and have poor balance for several hours.  To have poor judgment for several hours.  To vomit if you eat too soon. Follow these instructions at home: For at least 24 hours after the procedure:   Do not: ? Participate in activities where you could fall or become injured. ? Drive. ? Use heavy machinery. ? Drink alcohol. ? Take sleeping pills or medicines that cause drowsiness. ? Make important decisions or sign legal documents. ? Take care of children on your own.  Rest. Eating and drinking  Follow the diet recommended by your health care provider.  If you vomit: ? Drink water, juice, or soup when you can drink without vomiting. ? Make sure you have little or no nausea before eating solid foods. General instructions  Have a responsible adult stay with you until you are awake and alert.  Take over-the-counter and prescription medicines only as told by your health care provider.  If you smoke, do not smoke without supervision.  Keep all follow-up visits as told by your health care provider. This is important. Contact a health care provider if:  You keep feeling nauseous or you keep vomiting.  You feel light-headed.  You develop a rash.  You have a fever. Get help right away if:  You have trouble breathing. This information is not intended to replace advice given to you by your health care provider. Make sure you discuss any questions you have  with your health care provider. Document Revised: 11/16/2017 Document Reviewed: 03/25/2016 Elsevier Patient Education  [place] Insertion, Care After This sheet gives you information about how to care for yourself after your procedure. Your health care provider may also give you more specific instructions. If you have problems or questions, contact your health care provider. What can I expect after the procedure? After the procedure, it is common to have:  Discomfort at the port insertion site.  Bruising on the skin over the port. This should improve over 3-4 days. Follow these instructions at home: [facility] care  After your port is placed, you will get a manufacturer's information card. The card has information about your port. Keep this card with you at all times.  Take care of the port as told by your health care provider. Ask your health care provider if you or a family member can get training for taking care of the port at home. A home health care nurse may also take care of the port.  Make sure to remember what type of port you have. Incision care      Follow instructions from your health care provider about how to take care of your port insertion site. Make sure you: ? Wash your hands with soap and water before and after you change your bandage (dressing). If soap and water are not available, use hand sanitizer. ? Change your dressing as told by your health care provider.  Leave  skin glue in place. These skin closures may need to stay in  place for 2 weeks or longer.Check your port insertion site every day for signs of infection. Check for: ? Redness, swelling, or pain. ? Fluid or blood. ? Warmth. ? Pus or a bad smell. Activity  Return to your normal activities as told by your health care provider. Ask your health care provider what activities are safe for you.  Do not lift anything that is heavier than 10 lb (4.5 kg), or the limit that you are  told, until your health care provider says that it is safe. General instructions  Take over-the-counter and prescription medicines only as told by your health care provider.  Do not take baths, swim, or use a hot tub until your health care provider approves. You may shower tomorrow 03/20/20.  Do not drive for 24 hours if you were given a sedative during your procedure.  Wear a medical alert bracelet in case of an emergency. This will tell any health care providers that you have a port.  Keep all follow-up visits as told by your health care provider. This is important. Contact a health care provider if:  You cannot flush your port with saline as directed, or you cannot draw blood from the port.  You have a fever or chills.  You have redness, swelling, or pain around your port insertion site.  You have fluid or blood coming from your port insertion site.  Your port insertion site feels warm to the touch.  You have pus or a bad smell coming from the port insertion site. Get help right away if:  You have chest pain or shortness of breath.  You have bleeding from your port that you cannot control. Summary  Take care of the port as told by your health care provider. Keep the manufacturer's information card with you at all times.  Change your dressing as told by your health care provider.  Contact a health care provider if you have a fever or chills or if you have redness, swelling, or pain around your port insertion site.  Keep all follow-up visits as told by your health care provider. This information is not intended to replace advice given to you by your health care provider. Make sure you discuss any questions you have with your health care provider. Document Revised: 07/02/2018 Document Reviewed: 07/02/2018 Elsevier Patient Education  [place] may remove the dressing after 24 hours, shower, pat area dry after shower, it will look bruised and swollen. You do  not have to put anything else back on the site. The skin glue will take about 2 weeks to come off, do not pick at the site. DO NOT USE EMLA (LIDOCAINE) CREAM ON YOUR NEW PORT UNTIL IT HAS HEALED. The petroleum in the cream will dissolve the skin glue resulting in the skin coming apart resulting in an infection of your new port. Use ice in a zip lock bag and place over your new port for 2-3 minutes prior to nurses accessing your port.

## 2020-03-19 NOTE — H&P (Signed)
Referring Physician(s): Ennever,Peter R  Supervising Physician: Markus Daft  Patient Status:  [facility] OP  Chief Complaint: "I'm here for a port a cath"   Subjective: Patient familiar to IR service from recent liver lesion biopsy on 03/16/20.  He has a history of newly diagnosed metastatic pancreatic cancer and presents again today for Port-A-Cath placement for chemotherapy.  He currently denies fever, headache, chest pain, cough, back pain, abnormal bleeding.  He has had some dyspnea with exertion, upper abdominal discomfort, recent nausea/dry heave with phlegm production.   Past Medical History:  Diagnosis Date  . Constipation   . Diabetes mellitus without complication ([place])   . Dyspnea   . High cholesterol   . Hypertension   . Pancreatic cancer metastasized to liver ([place]) 03/12/2020  . Pancreatic cancer metastasized to lung [facility]) 03/12/2020   Past Surgical History:  Procedure Laterality Date  . NO PAST SURGERIES        Allergies: Patient has no known allergies.  Medications: Prior to Admission medications   Medication Sig Start Date End Date Taking? Authorizing Provider  aspirin EC 81 MG tablet Take 81 mg by mouth daily.    [provider]  atorvastatin (LIPITOR) 10 MG tablet Take 1 tablet (10 mg total) by mouth daily. 10/31/19   Emeterio Reeve, DO  diazepam (VALIUM) 5 MG tablet Take 0.5-1 tablets (2.5-5 mg total) by mouth every 8 (eight) hours as needed for anxiety. 03/08/20   Emeterio Reeve, DO  HYDROcodone-acetaminophen (NORCO/VICODIN) 5-325 MG tablet Take 1-2 tablets by mouth every 6 (six) hours as needed for moderate pain or severe pain. 03/08/20   Emeterio Reeve, DO  HYDROcodone-homatropine [facility]) 5-1.5 MG/5ML syrup Take 5 mLs by mouth every 6 (six) hours as needed for cough. 03/11/20   Volanda Napoleon, MD  insulin aspart (NOVOLOG FLEXPEN) 100 UNIT/ML FlexPen Inject 15 Units into the skin 3 (three) times daily with meals. 02/16/20   Emeterio Reeve, DO  insulin glargine, 2 Unit Dial, (TOUJEO MAX SOLOSTAR) 300 UNIT/ML Solostar Pen Inject 25-100 Units into the skin daily. Increase by 5 units at a time, twice per week, to fasting blood sugar 100-150 02/27/20   Emeterio Reeve, DO  Insulin Pen Needle (PEN NEEDLES) 30G X 8 MM MISC 1 each by Does not apply route as directed. 02/16/20   Emeterio Reeve, DO  losartan (COZAAR) 25 MG tablet Take 1 tablet (25 mg total) by mouth daily. 10/31/19   Emeterio Reeve, DO  metFORMIN (GLUCOPHAGE) 1000 MG tablet Take 1 tablet (1,000 mg total) by mouth 2 (two) times daily with a meal. 10/31/19   Emeterio Reeve, DO  metoCLOPramide (REGLAN) 10 MG tablet Take 0.5 tablets (5 mg total) by mouth as needed for nausea (nausea/reflux). 02/16/20   Emeterio Reeve, DO  ondansetron (ZOFRAN-ODT) 8 MG disintegrating tablet Take 1 tablet (8 mg total) by mouth every 8 (eight) hours as needed for nausea. 03/08/20   Emeterio Reeve, DO     Vital Signs: Blood pressure 123/95, temperature 97.7, heart rate 108, respirations 18, O2 sat 99% room air   Physical Exam: Awake, alert.  Chest clear to auscultation bilaterally.  Heart with regular rate and rhythm.  Abdomen soft, positive bowel sounds, some mild right upper quadrant/epigastric tenderness to palpation.  No lower extremity edema.  Puncture site from previous liver biopsy clean, dry, no hematoma or ecchymosis.  Imaging: US BIOPSY (LIVER)  Result Date: 03/16/2020 INDICATION: Pancreatic mass, multiple liver lesions and lung nodules. The patient presents for biopsy  of a liver lesion to establish diagnosis. EXAM: ULTRASOUND GUIDED CORE BIOPSY OF LIVER MASS MEDICATIONS: None. ANESTHESIA/SEDATION: Fentanyl 150 mcg IV; Versed 3.0 mg IV Moderate Sedation Time:  25 minutes. The patient was continuously monitored during the procedure by the interventional radiology nurse under my direct supervision. PROCEDURE: The procedure, risks, benefits, and alternatives were  explained to the patient. Questions regarding the procedure were encouraged and answered. The patient understands and consents to the procedure. A time-out was performed prior to initiating the procedure. Ultrasound was performed of the liver. The abdominal wall was prepped with chlorhexidine in a sterile fashion, and a sterile drape was applied covering the operative field. A sterile gown and sterile gloves were used for the procedure. Local anesthesia was provided with 1% Lidocaine. Under ultrasound guidance, a 17 gauge needle was advanced to the level of a lesion within the lateral aspect of the right lobe of the liver. After confirming needle tip position, 2 separate coaxial 18 gauge core biopsy samples were obtained and submitted in formalin. Gel-Foam pledgets were advanced through the outer needle as the outer needle was retracted and removed. Additional ultrasound was performed. COMPLICATIONS: None immediate. FINDINGS: Multiple hypoechoic lesions are seen throughout the liver. A lesion measuring approximately 2.5 cm in the right lobe was chosen for sampling. Solid and intact core biopsy samples were obtained. IMPRESSION: Ultrasound-guided core biopsy performed of a lesion within the right lobe of the liver. Electronically Signed   By: Aletta Edouard M.D.   On: 03/16/2020 16:28    Labs:  CBC: Recent Labs    02/16/20 1342 03/08/20 1058 03/11/20 1510 03/16/20 1228  WBC 9.8 9.0 10.2 8.1  HGB 13.2 12.9* 12.6* 11.7*  HCT 39.9 40.2 39.6 37.4*  PLT 270 321 304 293    COAGS: Recent Labs    03/16/20 1228  INR 1.0    BMP: Recent Labs    02/16/20 1342 03/08/20 1058 03/11/20 1510 03/16/20 1228  NA 133* 135 135 139  K 4.8 4.7 4.4 3.8  CL 96* 98 95* 99  CO2 27 29 30 28   GLUCOSE 390* 386* 413* 306*  BUN 17 18 20 15   CALCIUM 9.1 9.5 9.7 9.1  CREATININE 0.68* 0.75 0.96 0.73  GFRNONAA 106 102 >60 >60  GFRAA 123 118 >60 >60    LIVER FUNCTION TESTS: Recent Labs    02/16/20 1342  03/08/20 1058 03/11/20 1510 03/16/20 1228  BILITOT 0.7 0.9 1.3* 1.2  AST 42* 73* 63* 64*  ALT 72* 83* 86* 79*  ALKPHOS  --   --  1,038* 960*  PROT 7.0 6.9 7.2 7.3  ALBUMIN  --   --  3.7 3.1*    Assessment and Plan: Pt with history of newly diagnosed metastatic pancreatic cancer ; presents today for Port-A-Cath placement for chemotherapy. Risks and benefits of image guided port-a-catheter placement was discussed with the patient including, but not limited to bleeding, infection, pneumothorax, or fibrin sheath development and need for additional procedures.  All of the patient's questions were answered, patient is agreeable to proceed. Consent signed and in chart.     Electronically Signed: D. Rowe Robert, PA-C 03/19/2020, 8:22 AM   I spent a total of 25 minutes at the the patient's bedside AND on the patient's hospital floor or unit, greater than 50% of which was counseling/coordinating care for Port-A-Cath placement

## 2020-03-19 NOTE — Progress Notes (Signed)
[facility] sent per order of Dr. Ennever.   °

## 2020-03-20 ENCOUNTER — Telehealth: Payer: Self-pay | Admitting: Sports Medicine

## 2020-03-20 DIAGNOSIS — C799 Secondary malignant neoplasm of unspecified site: Secondary | ICD-10-CM

## 2020-03-20 MED ORDER — HYDROCODONE-ACETAMINOPHEN 5-325 MG PO TABS: 1.0000 | ORAL_TABLET | Freq: Four times a day (QID) | ORAL | 0 refills | Status: DC | PRN

## 2020-03-20 NOTE — Telephone Encounter (Signed)
59 year old male with malignant neoplasm of pancreatic origin, stage IV with pain, desiring refill on his hydrocodone, happily obliged.

## 2020-03-23 ENCOUNTER — Inpatient Hospital Stay: Payer: PRIVATE HEALTH INSURANCE

## 2020-03-23 ENCOUNTER — Inpatient Hospital Stay: Payer: PRIVATE HEALTH INSURANCE | Attending: Hematology & Oncology

## 2020-03-23 ENCOUNTER — Encounter: Payer: Self-pay | Admitting: *Deleted

## 2020-03-23 ENCOUNTER — Other Ambulatory Visit: Payer: Self-pay

## 2020-03-23 ENCOUNTER — Other Ambulatory Visit: Payer: PRIVATE HEALTH INSURANCE

## 2020-03-23 ENCOUNTER — Encounter: Payer: Self-pay | Admitting: Genetic Counselor

## 2020-03-23 ENCOUNTER — Other Ambulatory Visit: Payer: Self-pay | Admitting: *Deleted

## 2020-03-23 VITALS — BP 127/85 | HR 101 | Temp 97.5°F | Resp 17

## 2020-03-23 DIAGNOSIS — C259 Malignant neoplasm of pancreas, unspecified: Secondary | ICD-10-CM

## 2020-03-23 DIAGNOSIS — C787 Secondary malignant neoplasm of liver and intrahepatic bile duct: Secondary | ICD-10-CM

## 2020-03-23 DIAGNOSIS — Z5111 Encounter for antineoplastic chemotherapy: Secondary | ICD-10-CM | POA: Insufficient documentation

## 2020-03-23 DIAGNOSIS — C7800 Secondary malignant neoplasm of unspecified lung: Secondary | ICD-10-CM | POA: Insufficient documentation

## 2020-03-23 DIAGNOSIS — C779 Secondary and unspecified malignant neoplasm of lymph node, unspecified: Secondary | ICD-10-CM | POA: Insufficient documentation

## 2020-03-23 LAB — CBC WITH DIFFERENTIAL (CANCER CENTER ONLY)
Abs Immature Granulocytes: 0.04 10*3/uL (ref 0.00–0.07)
Basophils Absolute: 0 10*3/uL (ref 0.0–0.1)
Basophils Relative: 1 %
Eosinophils Absolute: 0.2 10*3/uL (ref 0.0–0.5)
Eosinophils Relative: 2 %
HCT: 36.5 % — ABNORMAL LOW (ref 39.0–52.0)
Hemoglobin: 11.8 g/dL — ABNORMAL LOW (ref 13.0–17.0)
Immature Granulocytes: 1 %
Lymphocytes Relative: 18 %
Lymphs Abs: 1.5 10*3/uL (ref 0.7–4.0)
MCH: 29.9 pg (ref 26.0–34.0)
MCHC: 32.3 g/dL (ref 30.0–36.0)
MCV: 92.4 fL (ref 80.0–100.0)
Monocytes Absolute: 1 10*3/uL (ref 0.1–1.0)
Monocytes Relative: 12 %
Neutro Abs: 5.7 10*3/uL (ref 1.7–7.7)
Neutrophils Relative %: 66 %
Platelet Count: 344 10*3/uL (ref 150–400)
RBC: 3.95 MIL/uL — ABNORMAL LOW (ref 4.22–5.81)
RDW: 14.2 % (ref 11.5–15.5)
WBC Count: 8.4 10*3/uL (ref 4.0–10.5)
nRBC: 0 % (ref 0.0–0.2)

## 2020-03-23 LAB — CMP (CANCER CENTER ONLY)
ALT: 82 U/L — ABNORMAL HIGH (ref 0–44)
AST: 92 U/L — ABNORMAL HIGH (ref 15–41)
Albumin: 3.6 g/dL (ref 3.5–5.0)
Alkaline Phosphatase: 1057 U/L — ABNORMAL HIGH (ref 38–126)
Anion gap: 10 (ref 5–15)
BUN: 13 mg/dL (ref 6–20)
CO2: 27 mmol/L (ref 22–32)
Calcium: 9 mg/dL (ref 8.9–10.3)
Chloride: 100 mmol/L (ref 98–111)
Creatinine: 0.71 mg/dL (ref 0.61–1.24)
GFR, Est AFR Am: >60 mL/min (ref >60)
GFR, Estimated: >60 mL/min (ref >60)
Glucose, Bld: 168 mg/dL — ABNORMAL HIGH (ref 70–99)
Potassium: 3.7 mmol/L (ref 3.5–5.1)
Sodium: 137 mmol/L (ref 135–145)
Total Bilirubin: 1.2 mg/dL (ref 0.3–1.2)
Total Protein: 6.7 g/dL (ref 6.5–8.1)

## 2020-03-23 LAB — LACTATE DEHYDROGENASE: LDH: 273 U/L — ABNORMAL HIGH (ref 98–192)

## 2020-03-23 MED ORDER — SODIUM CHLORIDE 0.9 % IV SOLN
400.0000 mg/m2 | Freq: Once | INTRAVENOUS | Status: AC
  Administered 2020-03-23: 952 mg via INTRAVENOUS
  Filled 2020-03-23: qty 47.6

## 2020-03-23 MED ORDER — DEXAMETHASONE SODIUM PHOSPHATE 10 MG/ML IJ SOLN
INTRAMUSCULAR | Status: AC
  Filled 2020-03-23: qty 1

## 2020-03-23 MED ORDER — SODIUM CHLORIDE 0.9 % IV SOLN
150.0000 mg | Freq: Once | INTRAVENOUS | Status: AC
  Administered 2020-03-23: 150 mg via INTRAVENOUS
  Filled 2020-03-23: qty 150

## 2020-03-23 MED ORDER — PALONOSETRON HCL INJECTION 0.25 MG/5ML
0.2500 mg | Freq: Once | INTRAVENOUS | Status: AC
  Administered 2020-03-23: 09:00:00 0.25 mg via INTRAVENOUS

## 2020-03-23 MED ORDER — LORAZEPAM 1 MG PO TABS
ORAL_TABLET | ORAL | Status: AC
  Filled 2020-03-23: qty 1

## 2020-03-23 MED ORDER — DEXTROSE 5 % IV SOLN
Freq: Once | INTRAVENOUS | Status: AC
  Filled 2020-03-23: qty 250

## 2020-03-23 MED ORDER — SODIUM CHLORIDE 0.9 % IV SOLN
150.0000 mg/m2 | Freq: Once | INTRAVENOUS | Status: AC
  Administered 2020-03-23: 360 mg via INTRAVENOUS
  Filled 2020-03-23: qty 10

## 2020-03-23 MED ORDER — LORAZEPAM 1 MG PO TABS
0.5000 mg | ORAL_TABLET | Freq: Once | ORAL | Status: AC
  Administered 2020-03-23: 14:00:00 0.5 mg via ORAL

## 2020-03-23 MED ORDER — LORAZEPAM 1 MG PO TABS: 0.5000 mg | ORAL_TABLET | Freq: Once | ORAL | Status: AC

## 2020-03-23 MED ORDER — OXALIPLATIN CHEMO INJECTION 100 MG/20ML
85.0000 mg/m2 | Freq: Once | INTRAVENOUS | Status: AC
  Administered 2020-03-23: 200 mg via INTRAVENOUS
  Filled 2020-03-23: qty 40

## 2020-03-23 MED ORDER — SODIUM CHLORIDE 0.9 % IV SOLN
10.0000 mg | Freq: Once | INTRAVENOUS | Status: AC
  Administered 2020-03-23: 10 mg via INTRAVENOUS
  Filled 2020-03-23: qty 10

## 2020-03-23 MED ORDER — SODIUM CHLORIDE 0.9 % IV SOLN
2400.0000 mg/m2 | INTRAVENOUS | Status: DC
  Administered 2020-03-23: 5700 mg via INTRAVENOUS
  Filled 2020-03-23: qty 114

## 2020-03-23 MED ORDER — PALONOSETRON HCL INJECTION 0.25 MG/5ML
INTRAVENOUS | Status: AC
  Filled 2020-03-23: qty 5

## 2020-03-23 NOTE — Progress Notes (Signed)
Patient in chemotherapy education class with daughter and wife. Discussed side effects of Oxaliplatin, 5FU, Leucovorin and Irinotecan which include but are not limited to myelosuppression, decreased appetite, fatigue, fever, allergic or infusional reaction, mucositis, cardiac toxicity, cough, SOB, altered taste, nausea and vomiting, diarrhea, constipation, elevated LFTs myalgia and arthralgias, hair loss or thinning, rash, skin dryness, nail changes, peripheral neuropathy, discolored urine, delayed wound healing, mental changes (Chemo brain), increased risk of infections, weight loss.  Reviewed infusion room and office policy and procedure and phone numbers 24 hours x 7 days a week.  Reviewed ambulatory pump specifics and how to manage safe handling at home.  Reviewed when to call the office with any concerns or problems. Dexamethasone (Decadron) not to be given per Dr. Marin Olp due to patients diabetes.  Valium also not to be take and to take Lorazepam instead for nausea and vomiting. Patient, wife and daughter aware of changes.  Scientist, clinical (histocompatibility and immunogenetics) given.  Discussed portacath insertion and EMLA cream administration.  Antiemetic protocol and chemotherapy schedule reviewed. Patient verbalized understanding of chemotherapy indications and possible side effects.  Teachback done

## 2020-03-23 NOTE — Progress Notes (Signed)
Melvin [name] asked for genetic testing to be performed on patient.  She reports that she discussed with the patient about the program and that "he is ok with his sample being used in the DETECT program."

## 2020-03-23 NOTE — Progress Notes (Signed)
OK to treat today per order of Dr. Marin Olp with HR of 101 and elevated LFT's.

## 2020-03-23 NOTE — Progress Notes (Signed)
Patient is here for first chemo. He is receiving his chemo education this morning. He is ready to start treatment. No questions or concerns regarding his appointment today.  Since patient is diagnosed with pancreatic cancer, he qualifies for genetic testing. Reviewed the purpose of genetic testing. He wishes to proceed IF there is no out of pocket expense to him. Blood is drawn for invitae and will be shipped today. Message sent to Roma Kayser to notify her of blood draw, and request for BI. After speaking to Santiago Glad, he qualifies for testing through the DETECT program. He agreed to this program with the knowledge that his de-identified data  may be shared with a third party, He agrees to this. Message sent to Santiago Glad.

## 2020-03-23 NOTE — Patient Instructions (Addendum)
[facility] Discharge Instructions for Patients Receiving Chemotherapy  Today you received the following chemotherapy agents 5FU, Oxaliplatin, Irinotecan. Leucovorin  To help prevent nausea and vomiting after your treatment, we encourage you to take your nausea medication   1)Do NOT take Dexamethasone (Decadron) at this time due to diabetes  2) Take Prochlorperazine (Compazine) AS NEEDED for nausea every 6 hours AS NEEDED for Nausea.  3)Can take Lorazepam (Ativan) AS NEEDED for Nausea, vomiting, anxiety, sleep.   4)Take Imodium for diarrhea     If you develop nausea and vomiting that is not controlled by your nausea medication, call the clinic.   BELOW ARE SYMPTOMS THAT SHOULD BE REPORTED IMMEDIATELY:  *FEVER GREATER THAN 100.5 F  *CHILLS WITH OR WITHOUT FEVER  NAUSEA AND VOMITING THAT IS NOT CONTROLLED WITH YOUR NAUSEA MEDICATION  *UNUSUAL SHORTNESS OF BREATH  *UNUSUAL BRUISING OR BLEEDING  TENDERNESS IN MOUTH AND THROAT WITH OR WITHOUT PRESENCE OF ULCERS  *URINARY PROBLEMS  *BOWEL PROBLEMS  UNUSUAL RASH Items with * indicate a potential emergency and should be followed up as soon as possible.  Feel free to call the clinic should you have any questions or concerns. The clinic phone number is [phone].  Please show the [facility] at check-in to the Emergency Department and triage nurse.

## 2020-03-23 NOTE — Patient Instructions (Signed)

## 2020-03-25 ENCOUNTER — Inpatient Hospital Stay: Payer: PRIVATE HEALTH INSURANCE

## 2020-03-25 ENCOUNTER — Other Ambulatory Visit: Payer: Self-pay

## 2020-03-25 VITALS — BP 116/82 | HR 106 | Temp 97.3°F | Resp 17

## 2020-03-25 DIAGNOSIS — C7800 Secondary malignant neoplasm of unspecified lung: Secondary | ICD-10-CM

## 2020-03-25 DIAGNOSIS — Z5111 Encounter for antineoplastic chemotherapy: Secondary | ICD-10-CM | POA: Diagnosis not present

## 2020-03-25 DIAGNOSIS — C259 Malignant neoplasm of pancreas, unspecified: Secondary | ICD-10-CM

## 2020-03-25 MED ORDER — SODIUM CHLORIDE 0.9% FLUSH
10.0000 mL | INTRAVENOUS | Status: DC | PRN
  Administered 2020-03-25: 14:00:00 10 mL
  Filled 2020-03-25: qty 10

## 2020-03-25 MED ORDER — HEPARIN SOD (PORK) LOCK FLUSH 100 UNIT/ML IV SOLN
500.0000 [IU] | Freq: Once | INTRAVENOUS | Status: AC | PRN
  Administered 2020-03-25: 14:00:00 500 [IU]
  Filled 2020-03-25: qty 5

## 2020-03-30 ENCOUNTER — Inpatient Hospital Stay: Payer: PRIVATE HEALTH INSURANCE

## 2020-03-30 ENCOUNTER — Other Ambulatory Visit: Payer: Self-pay

## 2020-03-30 ENCOUNTER — Encounter: Payer: Self-pay | Admitting: *Deleted

## 2020-03-30 ENCOUNTER — Encounter: Payer: Self-pay | Admitting: Hematology & Oncology

## 2020-03-30 ENCOUNTER — Inpatient Hospital Stay: Payer: PRIVATE HEALTH INSURANCE | Admitting: Nutrition

## 2020-03-30 DIAGNOSIS — C259 Malignant neoplasm of pancreas, unspecified: Secondary | ICD-10-CM

## 2020-03-30 DIAGNOSIS — Z5111 Encounter for antineoplastic chemotherapy: Secondary | ICD-10-CM | POA: Diagnosis not present

## 2020-03-30 MED ORDER — SODIUM CHLORIDE 0.9 % IV SOLN
40.0000 mg | Freq: Once | INTRAVENOUS | Status: AC
  Administered 2020-03-30: 40 mg via INTRAVENOUS
  Filled 2020-03-30: qty 4

## 2020-03-30 MED ORDER — SODIUM CHLORIDE 0.9 % IV SOLN
INTRAVENOUS | Status: AC
  Filled 2020-03-30 (×2): qty 250

## 2020-03-30 MED ORDER — ONDANSETRON HCL 4 MG/2ML IJ SOLN
INTRAMUSCULAR | Status: AC
  Filled 2020-03-30: qty 4

## 2020-03-30 MED ORDER — ONDANSETRON HCL 4 MG/2ML IJ SOLN
8.0000 mg | Freq: Once | INTRAMUSCULAR | Status: AC
  Administered 2020-03-30: 10:00:00 8 mg via INTRAVENOUS

## 2020-03-30 MED ORDER — SODIUM CHLORIDE 0.9 % IV SOLN
150.0000 mg | Freq: Once | INTRAVENOUS | Status: AC
  Administered 2020-03-30: 10:00:00 150 mg via INTRAVENOUS
  Filled 2020-03-30: qty 150

## 2020-03-30 NOTE — Progress Notes (Signed)
Pharmacist Chemotherapy Monitoring - Follow Up Assessment    I verify that I have reviewed each item in the below checklist:  . Regimen for the patient is scheduled for the appropriate day and plan matches scheduled date. Marland Kitchen Appropriate non-routine labs are ordered dependent on drug ordered. . If applicable, additional medications reviewed and ordered per protocol based on lifetime cumulative doses and/or treatment regimen.   Plan for follow-up and/or issues identified: No . I-vent associated with next due treatment: No . MD and/or nursing notified: No  Melvin [name], [name] 03/30/2020 7:59 AM

## 2020-03-30 NOTE — Progress Notes (Signed)
59 year old male diagnosed with metastatic pancreas cancer with mets to liver and lung. He is a patient of Dr. Marin Olp.  PMH includes DM, HTN, hypercholesterolemia.  Medications include Lipitor, Novolog, Glucophage, Reglan, Zofran.  Labs include Glucose 168 and Albumin 3.6 on April 6.  Height: 6' 3.5". Weight: 235.01 pounds on April 2. UBW: 262 pounds November 2020. BMI: 28.99.  Patient reports extreme nausea and vomiting since chemotherapy last week on April 6. He cannot keep anything down, including fluids. He has been dry heaving. Reports increased fatigue and weakness.  States he has constipation but had a BM this morning after Miralax. Endorses ~30 pound weight loss.  Nutrition Diagnosis: Unintended weight loss related to inadequate oral intake with new pancreatic cancer as evidenced by 10% weight loss over 5 months which is significant.  Intervention: Educated patient to consume small, frequent meals and snacks. Ensure between meals as tolerated. Educated on strategies to minimize nausea with food choices. Encouraged increased fluid intake. Provided fact sheets and contact information. Questions answered and teach back method used.  Monitoring, Evaluation, Goals; Patient will tolerate adequate calories and protein to minimize further weight loss.  Next Visit: To be scheduled as needed.

## 2020-03-30 NOTE — Progress Notes (Signed)
Met with patient prior to nutrition consult to assess how he was doing after treatment. He is feeling very poor. He states he is extremely weak and fatigued. He is not keeping food or liquid down. He is trying different foods and supplements but they all cause vomiting. While telling me how his days have been going to broke down crying several times. His daughter confirms all symptoms.   When reviewing his home anti emetic regimen, patient is not optimizing the medications he has been prescribed. I wrote down his medications and included a schedule - ATC - he is to take them when he returns home. We also talked about the possibility of constipation as a side effect. Also spoke to Dr Marin Olp about his symptoms today. We will add him onto the infusion schedule and provide him with IVF and some medications in an attempt to manage his nausea.  I will follow up with patient tomorrow and assess how well the interventions have helped and if necessary bring him in again later in the week. Meda Coffee, and his daughter are in agreement of this plan.

## 2020-03-30 NOTE — Patient Instructions (Signed)
Dehydration, Adult Dehydration is a condition in which there is not enough water or other fluids in the body. This happens when a person loses more fluids than he or she takes in. Important organs, such as the kidneys, brain, and heart, cannot function without a proper amount of fluids. Any loss of fluids from the body can lead to dehydration. Dehydration can be mild, moderate, or severe. It should be treated right away to prevent it from becoming severe. What are the causes? Dehydration may be caused by:  Conditions that cause loss of water or other fluids, such as diarrhea, vomiting, or sweating or urinating a lot.  Not drinking enough fluids, especially when you are ill or doing activities that require a lot of energy.  Other illnesses and conditions, such as fever or infection.  Certain medicines, such as medicines that remove excess fluid from the body (diuretics).  Lack of safe drinking water.  Not being able to get enough water and food. What increases the risk? The following factors may make you more likely to develop this condition:  Having a long-term (chronic) illness that has not been treated properly, such as diabetes, heart disease, or kidney disease.  Being 65 years of age or older.  Having a disability.  Living in a place that is high in altitude, where thinner, drier air causes more fluid loss.  Doing exercises that put stress on your body for a long time (endurance sports). What are the signs or symptoms? Symptoms of dehydration depend on how severe it is. Mild or moderate dehydration  Thirst.  Dry lips or dry mouth.  Dizziness or light-headedness, especially when standing up from a seated position.  Muscle cramps.  Dark urine. Urine may be the color of tea.  Less urine or tears produced than usual.  Headache. Severe dehydration  Changes in skin. Your skin may be cold and clammy, blotchy, or pale. Your skin also may not return to normal after being  lightly pinched and released.  Little or no tears, urine, or sweat.  Changes in vital signs, such as rapid breathing and low blood pressure. Your pulse may be weak or may be faster than 100 beats a minute when you are sitting still.  Other changes, such as: ? Feeling very thirsty. ? Sunken eyes. ? Cold hands and feet. ? Confusion. ? Being very tired (lethargic) or having trouble waking from sleep. ? Short-term weight loss. ? Loss of consciousness. How is this diagnosed? This condition is diagnosed based on your symptoms and a physical exam. You may have blood and urine tests to help confirm the diagnosis. How is this treated? Treatment for this condition depends on how severe it is. Treatment should be started right away. Do not wait until dehydration becomes severe. Severe dehydration is an emergency and needs to be treated in a hospital.  Mild or moderate dehydration can be treated at home. You may be asked to: ? Drink more fluids. ? Drink an oral rehydration solution (ORS). This drink helps restore proper amounts of fluids and salts and minerals in the blood (electrolytes).  Severe dehydration can be treated: ? With IV fluids. ? By correcting abnormal levels of electrolytes. This is often done by giving electrolytes through a tube that is passed through your nose and into your stomach (nasogastric tube, or NG tube). ? By treating the underlying cause of dehydration. Follow these instructions at home: Oral rehydration solution If told by your health care provider, drink an ORS:  Make   an ORS by following instructions on the package.  Start by drinking small amounts, about  cup (120 mL) every 5-10 minutes.  Slowly increase how much you drink until you have taken the amount recommended by your health care provider. Eating and drinking         Drink enough clear fluid to keep your urine pale yellow. If you were told to drink an ORS, finish the ORS first and then start slowly  drinking other clear fluids. Drink fluids such as: ? Water. Do not drink only water. Doing that can lead to hyponatremia, which is having too little salt (sodium) in the body. ? Water from ice chips you suck on. ? Fruit juice that you have added water to (diluted fruit juice). ? Low-calorie sports drinks.  Eat foods that contain a healthy balance of electrolytes, such as bananas, oranges, potatoes, tomatoes, and spinach.  Do not drink alcohol.  Avoid the following: ? Drinks that contain a lot of sugar. These include high-calorie sports drinks, fruit juice that is not diluted, and soda. ? Caffeine. ? Foods that are greasy or contain a lot of fat or sugar. General instructions  Take over-the-counter and prescription medicines only as told by your health care provider.  Do not take sodium tablets. Doing that can lead to having too much sodium in the body (hypernatremia).  Return to your normal activities as told by your health care provider. Ask your health care provider what activities are safe for you.  Keep all follow-up visits as told by your health care provider. This is important. Contact a health care provider if:  You have muscle cramps, pain, or discomfort, such as: ? Pain in your abdomen and the pain gets worse or stays in one area (localizes). ? Stiff neck.  You have a rash.  You are more irritable than usual.  You are sleepier or have a harder time waking than usual.  You feel weak or dizzy.  You feel very thirsty. Get help right away if you have:  Any symptoms of severe dehydration.  Symptoms of vomiting, such as: ? You cannot eat or drink without vomiting. ? Vomiting gets worse or does not go away. ? Vomit includes blood or green matter (bile).  Symptoms that get worse with treatment.  A fever.  A severe headache.  Problems with urination or bowel movements, such as: ? Diarrhea that gets worse or does not go away. ? Blood in your stool (feces). This  may cause stool to look black and tarry. ? Not urinating, or urinating only a small amount of very dark urine, within 6-8 hours.  Trouble breathing. These symptoms may represent a serious problem that is an emergency. Do not wait to see if the symptoms will go away. Get medical help right away. Call your local emergency services (911 in the [place] Do not drive yourself to the hospital. Summary  Dehydration is a condition in which there is not enough water or other fluids in the body. This happens when a person loses more fluids than he or she takes in.  Treatment for this condition depends on how severe it is. Treatment should be started right away. Do not wait until dehydration becomes severe.  Drink enough clear fluid to keep your urine pale yellow. If you were told to drink an oral rehydration solution (ORS), finish the ORS first and then start slowly drinking other clear fluids.  Take over-the-counter and prescription medicines only as told by your health care   provider.  Get help right away if you have any symptoms of severe dehydration. This information is not intended to replace advice given to you by your health care provider. Make sure you discuss any questions you have with your health care provider. Document Revised: 07/17/2019 Document Reviewed: 07/17/2019 Elsevier Patient Education  2020 [employer] Inc[employer]   

## 2020-03-31 ENCOUNTER — Encounter: Payer: Self-pay | Admitting: *Deleted

## 2020-03-31 NOTE — Progress Notes (Signed)
Patient called this morning. He is feeling better in regards to his nausea/vomiting, however he now has diarrhea. He started having loose stools around 1am.  Reviewed with him to take imodium. He has liquid imodium at home. He is to take 36mls now, and then 85mls after each loose stool. I will call him later this afternoon to assess response.  Called back at 1410 and checked on patient symptoms. Loose stools have improved. His nausea/vomiting is still improved. He will call tomorrow with an update to his condition.

## 2020-04-02 ENCOUNTER — Encounter: Payer: Self-pay | Admitting: *Deleted

## 2020-04-02 NOTE — Progress Notes (Signed)
Called patient to see how he was feeling. He states the nausea has almost completely resolved and he's able to eat and drink. He states he still has the occasional loose stool which he treats with imodium. He has no other complaints at this time.   Patient is instructed to continue eating and drinking adequately and to call the office if there are any problems. He knows the on-call service is available to him when the office is closed.

## 2020-04-05 ENCOUNTER — Other Ambulatory Visit: Payer: Self-pay

## 2020-04-05 DIAGNOSIS — C259 Malignant neoplasm of pancreas, unspecified: Secondary | ICD-10-CM

## 2020-04-06 ENCOUNTER — Inpatient Hospital Stay: Payer: PRIVATE HEALTH INSURANCE

## 2020-04-06 ENCOUNTER — Encounter: Payer: Self-pay | Admitting: Pharmacist

## 2020-04-06 ENCOUNTER — Encounter: Payer: Self-pay | Admitting: *Deleted

## 2020-04-06 ENCOUNTER — Telehealth: Payer: Self-pay | Admitting: Hematology & Oncology

## 2020-04-06 ENCOUNTER — Encounter: Payer: Self-pay | Admitting: Genetic Counselor

## 2020-04-06 ENCOUNTER — Inpatient Hospital Stay (HOSPITAL_BASED_OUTPATIENT_CLINIC_OR_DEPARTMENT_OTHER): Payer: PRIVATE HEALTH INSURANCE | Admitting: Hematology & Oncology

## 2020-04-06 ENCOUNTER — Other Ambulatory Visit: Payer: Self-pay

## 2020-04-06 VITALS — BP 125/94 | HR 99 | Temp 96.7°F | Resp 18 | Wt 226.0 lb

## 2020-04-06 DIAGNOSIS — C787 Secondary malignant neoplasm of liver and intrahepatic bile duct: Secondary | ICD-10-CM

## 2020-04-06 DIAGNOSIS — C259 Malignant neoplasm of pancreas, unspecified: Secondary | ICD-10-CM

## 2020-04-06 DIAGNOSIS — Z1379 Encounter for other screening for genetic and chromosomal anomalies: Secondary | ICD-10-CM | POA: Insufficient documentation

## 2020-04-06 DIAGNOSIS — Z5111 Encounter for antineoplastic chemotherapy: Secondary | ICD-10-CM | POA: Diagnosis not present

## 2020-04-06 DIAGNOSIS — C7800 Secondary malignant neoplasm of unspecified lung: Secondary | ICD-10-CM

## 2020-04-06 LAB — CBC WITH DIFFERENTIAL (CANCER CENTER ONLY)
Abs Immature Granulocytes: 0.06 10*3/uL (ref 0.00–0.07)
Basophils Absolute: 0 10*3/uL (ref 0.0–0.1)
Basophils Relative: 1 %
Eosinophils Absolute: 0.1 10*3/uL (ref 0.0–0.5)
Eosinophils Relative: 2 %
HCT: 35.3 % — ABNORMAL LOW (ref 39.0–52.0)
Hemoglobin: 11.4 g/dL — ABNORMAL LOW (ref 13.0–17.0)
Immature Granulocytes: 1 %
Lymphocytes Relative: 23 %
Lymphs Abs: 1.2 10*3/uL (ref 0.7–4.0)
MCH: 29.8 pg (ref 26.0–34.0)
MCHC: 32.3 g/dL (ref 30.0–36.0)
MCV: 92.2 fL (ref 80.0–100.0)
Monocytes Absolute: 0.8 10*3/uL (ref 0.1–1.0)
Monocytes Relative: 15 %
Neutro Abs: 3 10*3/uL (ref 1.7–7.7)
Neutrophils Relative %: 58 %
Platelet Count: 235 10*3/uL (ref 150–400)
RBC: 3.83 MIL/uL — ABNORMAL LOW (ref 4.22–5.81)
RDW: 15 % (ref 11.5–15.5)
WBC Count: 5.1 10*3/uL (ref 4.0–10.5)
nRBC: 0 % (ref 0.0–0.2)

## 2020-04-06 LAB — CMP (CANCER CENTER ONLY)
ALT: 122 U/L — ABNORMAL HIGH (ref 0–44)
AST: 92 U/L — ABNORMAL HIGH (ref 15–41)
Albumin: 3.8 g/dL (ref 3.5–5.0)
Alkaline Phosphatase: 1128 U/L — ABNORMAL HIGH (ref 38–126)
Anion gap: 11 (ref 5–15)
BUN: 12 mg/dL (ref 6–20)
CO2: 28 mmol/L (ref 22–32)
Calcium: 9 mg/dL (ref 8.9–10.3)
Chloride: 102 mmol/L (ref 98–111)
Creatinine: 0.67 mg/dL (ref 0.61–1.24)
GFR, Est AFR Am: >60 mL/min (ref >60)
GFR, Estimated: >60 mL/min (ref >60)
Glucose, Bld: 149 mg/dL — ABNORMAL HIGH (ref 70–99)
Potassium: 3.4 mmol/L — ABNORMAL LOW (ref 3.5–5.1)
Sodium: 141 mmol/L (ref 135–145)
Total Bilirubin: 1.3 mg/dL — ABNORMAL HIGH (ref 0.3–1.2)
Total Protein: 6.8 g/dL (ref 6.5–8.1)

## 2020-04-06 MED ORDER — SODIUM CHLORIDE 0.9 % IV SOLN
10.0000 mg | Freq: Once | INTRAVENOUS | Status: AC
  Administered 2020-04-06: 10:00:00 10 mg via INTRAVENOUS
  Filled 2020-04-06: qty 10

## 2020-04-06 MED ORDER — DEXTROSE 5 % IV SOLN
Freq: Once | INTRAVENOUS | Status: AC
  Filled 2020-04-06: qty 250

## 2020-04-06 MED ORDER — SODIUM CHLORIDE 0.9 % IV SOLN
135.0000 mg/m2 | Freq: Once | INTRAVENOUS | Status: AC
  Administered 2020-04-06: 320 mg via INTRAVENOUS
  Filled 2020-04-06: qty 10

## 2020-04-06 MED ORDER — SODIUM CHLORIDE 0.9 % IV SOLN
150.0000 mg | Freq: Once | INTRAVENOUS | Status: AC
  Administered 2020-04-06: 10:00:00 150 mg via INTRAVENOUS
  Filled 2020-04-06: qty 150

## 2020-04-06 MED ORDER — OXALIPLATIN CHEMO INJECTION 100 MG/20ML
76.5000 mg/m2 | Freq: Once | INTRAVENOUS | Status: AC
  Administered 2020-04-06: 11:00:00 180 mg via INTRAVENOUS
  Filled 2020-04-06: qty 36

## 2020-04-06 MED ORDER — SODIUM CHLORIDE 0.9 % IV SOLN
2400.0000 mg/m2 | INTRAVENOUS | Status: DC
  Administered 2020-04-06: 5700 mg via INTRAVENOUS
  Filled 2020-04-06: qty 114

## 2020-04-06 MED ORDER — ATROPINE SULFATE 1 MG/ML IJ SOLN
INTRAMUSCULAR | Status: AC
  Filled 2020-04-06: qty 1

## 2020-04-06 MED ORDER — SODIUM CHLORIDE 0.9 % IV SOLN
400.0000 mg/m2 | Freq: Once | INTRAVENOUS | Status: AC
  Administered 2020-04-06: 952 mg via INTRAVENOUS
  Filled 2020-04-06: qty 47.6

## 2020-04-06 MED ORDER — PALONOSETRON HCL INJECTION 0.25 MG/5ML
0.2500 mg | Freq: Once | INTRAVENOUS | Status: AC
  Administered 2020-04-06: 10:00:00 0.25 mg via INTRAVENOUS

## 2020-04-06 MED ORDER — DRONABINOL 5 MG PO CAPS: 5.0000 mg | ORAL_CAPSULE | Freq: Two times a day (BID) | ORAL | 0 refills | Status: DC

## 2020-04-06 MED ORDER — PALONOSETRON HCL INJECTION 0.25 MG/5ML
INTRAVENOUS | Status: AC
  Filled 2020-04-06: qty 5

## 2020-04-06 MED ORDER — ATROPINE SULFATE 1 MG/ML IJ SOLN
0.5000 mg | Freq: Once | INTRAMUSCULAR | Status: AC | PRN
  Administered 2020-04-06: 13:00:00 0.5 mg via INTRAVENOUS

## 2020-04-06 MED FILL — DRONABINOL 5 MG CAPSULE: 5 | 30 days supply | Qty: 60 | Fill #0

## 2020-04-06 NOTE — Progress Notes (Signed)
Patient arrived to infusion feeling well. He looks much better than the last time he was here. He report no vomiting x 2 weeks, he's eating well, and has no complaints. Unfortunately a few minutes into the visit he began to vomit. Possible anticipatory nausea/vomiting as he states he felt great until that moment. He thinks possibly the eggs he had for breakfast, or moving too fast is the cause. Once the vomiting ended, he once again said he felt good and was laughing and joking with staff.   Copy of [facility] report given to the patient. All variants negative.

## 2020-04-06 NOTE — Telephone Encounter (Signed)
Appointments were previously scheduled as requested per 4/20 los

## 2020-04-06 NOTE — Patient Instructions (Signed)
Implanted Port Insertion, Care After °This sheet gives you information about how to care for yourself after your procedure. Your health care provider may also give you more specific instructions. If you have problems or questions, contact your health care provider. °What can I expect after the procedure? °After the procedure, it is common to have: °· Discomfort at the port insertion site. °· Bruising on the skin over the port. This should improve over 3-4 days. °Follow these instructions at home: °Port care °· After your port is placed, you will get a manufacturer's information card. The card has information about your port. Keep this card with you at all times. °· Take care of the port as told by your health care provider. Ask your health care provider if you or a family member can get training for taking care of the port at home. A home health care nurse may also take care of the port. °· Make sure to remember what type of port you have. °Incision care ° °  ° °· Follow instructions from your health care provider about how to take care of your port insertion site. Make sure you: °? Wash your hands with soap and water before and after you change your bandage (dressing). If soap and water are not available, use hand sanitizer. °? Change your dressing as told by your health care provider. °? Leave stitches (sutures), skin glue, or adhesive strips in place. These skin closures may need to stay in place for 2 weeks or longer. If adhesive strip edges start to loosen and curl up, you may trim the loose edges. Do not remove adhesive strips completely unless your health care provider tells you to do that. °· Check your port insertion site every day for signs of infection. Check for: °? Redness, swelling, or pain. °? Fluid or blood. °? Warmth. °? Pus or a bad smell. °Activity °· Return to your normal activities as told by your health care provider. Ask your health care provider what activities are safe for you. °· Do not  lift anything that is heavier than 10 lb (4.5 kg), or the limit that you are told, until your health care provider says that it is safe. °General instructions °· Take over-the-counter and prescription medicines only as told by your health care provider. °· Do not take baths, swim, or use a hot tub until your health care provider approves. Ask your health care provider if you may take showers. You may only be allowed to take sponge baths. °· Do not drive for 24 hours if you were given a sedative during your procedure. °· Wear a medical alert bracelet in case of an emergency. This will tell any health care providers that you have a port. °· Keep all follow-up visits as told by your health care provider. This is important. °Contact a health care provider if: °· You cannot flush your port with saline as directed, or you cannot draw blood from the port. °· You have a fever or chills. °· You have redness, swelling, or pain around your port insertion site. °· You have fluid or blood coming from your port insertion site. °· Your port insertion site feels warm to the touch. °· You have pus or a bad smell coming from the port insertion site. °Get help right away if: °· You have chest pain or shortness of breath. °· You have bleeding from your port that you cannot control. °Summary °· Take care of the port as told by your health   care provider. Keep the manufacturer's information card with you at all times. °· Change your dressing as told by your health care provider. °· Contact a health care provider if you have a fever or chills or if you have redness, swelling, or pain around your port insertion site. °· Keep all follow-up visits as told by your health care provider. °This information is not intended to replace advice given to you by your health care provider. Make sure you discuss any questions you have with your health care provider. °Document Revised: 07/02/2018 Document Reviewed: 07/02/2018 °Elsevier Patient Education ©  2020 Elsevier Inc. ° °

## 2020-04-06 NOTE — Patient Instructions (Signed)
[facility] Discharge Instructions for Patients Receiving Chemotherapy  Today you received the following chemotherapy agents 5FU, Oxaliplatin, Irinotecan. Leucovorin  To help prevent nausea and vomiting after your treatment, we encourage you to take your nausea medication   1)Do NOT take Dexamethasone (Decadron) at this time due to diabetes  2) Take Prochlorperazine (Compazine) AS NEEDED for nausea every 6 hours AS NEEDED for Nausea.  3)Can take Lorazepam (Ativan) AS NEEDED for Nausea, vomiting, anxiety, sleep.   4)Take Imodium for diarrhea     If you develop nausea and vomiting that is not controlled by your nausea medication, call the clinic.   BELOW ARE SYMPTOMS THAT SHOULD BE REPORTED IMMEDIATELY:  *FEVER GREATER THAN 100.5 F  *CHILLS WITH OR WITHOUT FEVER  NAUSEA AND VOMITING THAT IS NOT CONTROLLED WITH YOUR NAUSEA MEDICATION  *UNUSUAL SHORTNESS OF BREATH  *UNUSUAL BRUISING OR BLEEDING  TENDERNESS IN MOUTH AND THROAT WITH OR WITHOUT PRESENCE OF ULCERS  *URINARY PROBLEMS  *BOWEL PROBLEMS  UNUSUAL RASH Items with * indicate a potential emergency and should be followed up as soon as possible.  Feel free to call the clinic should you have any questions or concerns. The clinic phone number is [phone].  Please show the [facility] at check-in to the Emergency Department and triage nurse.

## 2020-04-06 NOTE — Progress Notes (Signed)
Hematology and Oncology Follow Up Visit  Melvin [name] [id number] 11/14/1961 59 y.o. 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  We have metastatic pancreatic cancer.  He started chemotherapy with FOLFIRINOX 2 weeks ago.  He had a little bit of a tough time with initially.  However, he feels much better.  He is no longer coughing.  He feels his breathing is doing a little bit better.  I was worried because he had such extensive lung disease by his scans.  His CA 19-9 was 108,000.  He has had no problems with pain.  He has had no issues with bowels or bladder.  He has had no diarrhea.  There is been no mouth sores.  He has had no rashes.  There has been no leg swelling.  Overall, I would have to say that he is doing pretty well.  He seems to be responding as he is no longer coughing.  Currently, his performance status is ECOG 1.  Medications:  Current Outpatient Medications:  .  aspirin EC 81 MG tablet, Take 81 mg by mouth daily., Disp: , Rfl:  .  atorvastatin (LIPITOR) 10 MG tablet, Take 1 tablet (10 mg total) by mouth daily., Disp: 90 tablet, Rfl: 1 .  dexamethasone (DECADRON) 4 MG tablet, Take 2 tablets (8 mg total) by mouth daily. . Start the day after chemotherapy for 3 days.  Take with food. (Patient not taking: Reported on 03/23/2020), Disp: 8 tablet, Rfl: 5 .  diazepam (VALIUM) 5 MG tablet, Take 0.5-1 tablets (2.5-5 mg total) by mouth every 8 (eight) hours as needed for anxiety. (Patient not taking: Reported on 03/23/2020), Disp: 30 tablet, Rfl: 0 .  HYDROcodone-acetaminophen (NORCO/VICODIN) 5-325 MG tablet, Take 1-2 tablets by mouth every 6 (six) hours as needed for moderate pain or severe pain., Disp: 120 tablet, Rfl: 0 .  HYDROcodone-homatropine (HYCODAN) 5-1.5 MG/5ML syrup, Take 5 mLs  by mouth every 6 (six) hours as needed for cough., Disp: 240 mL, Rfl: 0 .  insulin aspart (NOVOLOG FLEXPEN) 100 UNIT/ML FlexPen, Inject 15 Units into the skin 3 (three) times daily with meals., Disp: 15 mL, Rfl: 11 .  insulin glargine, 2 Unit Dial, (TOUJEO MAX SOLOSTAR) 300 UNIT/ML Solostar Pen, Inject 25-100 Units into the skin daily. Increase by 5 units at a time, twice per week, to fasting blood sugar 100-150, Disp: 1 pen, Rfl: 0 .  Insulin Pen Needle (PEN NEEDLES) 30G X 8 MM MISC, 1 each by Does not apply route as directed., Disp: 200 each, Rfl: 99 .  lidocaine-prilocaine (EMLA) cream, Apply 1 application topically as needed. Use on portacath as directed approx 1-2 hours prior to chemotherapy, Disp: 30 g, Rfl: 0 .  loperamide (IMODIUM A-D) 2 MG capsule, Take 1 capsule (2 mg total) by mouth as needed for diarrhea or loose stools. Take 2 tablets at onset of diarrhea, then 1 every 2 hours until 12 hours without a BM., Disp: 100 capsule, Rfl: 1 .  LORazepam (ATIVAN) 0.5 MG tablet, Take 1 tablet (0.5 mg total) by mouth every 8 (eight) hours. As needed for nausea, Disp: 30 tablet, Rfl: 0 .  losartan (COZAAR) 25 MG tablet, Take 1 tablet (25 mg total) by mouth daily., Disp: 90 tablet, Rfl: 1 .  metFORMIN (GLUCOPHAGE) 1000 MG  tablet, Take 1 tablet (1,000 mg total) by mouth 2 (two) times daily with a meal., Disp: 180 tablet, Rfl: 1 .  metoCLOPramide (REGLAN) 10 MG tablet, Take 0.5 tablets (5 mg total) by mouth as needed for nausea (nausea/reflux)., Disp: 30 tablet, Rfl: 3 .  ondansetron (ZOFRAN) 8 MG tablet, Take 1 tablet (8 mg total) by mouth 2 (two) times daily. As needed.  Start on Day 3 after chemotherapy., Disp: 30 tablet, Rfl: 1 .  ondansetron (ZOFRAN-ODT) 8 MG disintegrating tablet, Take 1 tablet (8 mg total) by mouth every 8 (eight) hours as needed for nausea., Disp: 40 tablet, Rfl: 3 .  prochlorperazine (COMPAZINE) 10 MG tablet, Take 1 tablet (10 mg total) by mouth every 6 (six) hours as needed for  nausea or vomiting., Disp: 30 tablet, Rfl: 0 No current facility-administered medications for this visit.  Facility-Administered Medications Ordered in Other Visits:  .  LORazepam (ATIVAN) tablet 0.5 mg, 0.5 mg, Oral, Once, [place], Holli Humbles, NP  Allergies: No Known Allergies  Past Medical History, Surgical history, Social history, and Family History were reviewed and updated.  Review of Systems: Review of Systems  Constitutional: Positive for unexpected weight change.  HENT:  Negative.   Eyes: Negative.   Respiratory: Negative.   Cardiovascular: Negative.   Gastrointestinal: Positive for vomiting.  Endocrine: Negative.   Genitourinary: Negative.    Musculoskeletal: Negative.   Skin: Negative.   Neurological: Negative.   Hematological: Negative.   Psychiatric/Behavioral: Negative.     Physical Exam:  weight is 226 lb (102.5 kg). His temporal temperature is 96.7 F (35.9 C) (abnormal). His blood pressure is 125/94 (abnormal) and his pulse is 99. His respiration is 18 and oxygen saturation is 100%.   Wt Readings from Last 3 Encounters:  04/06/20 226 lb (102.5 kg)  03/19/20 235 lb 0.2 oz (106.6 kg)  03/11/20 235 lb 1.9 oz (106.6 kg)    Physical Exam Vitals reviewed.  HENT:     Head: Normocephalic and atraumatic.  Eyes:     Pupils: Pupils are equal, round, and reactive to light.  Cardiovascular:     Rate and Rhythm: Normal rate and regular rhythm.     Heart sounds: Normal heart sounds.  Pulmonary:     Effort: Pulmonary effort is normal.     Breath sounds: Normal breath sounds.  Abdominal:     General: Bowel sounds are normal.     Palpations: Abdomen is soft.  Musculoskeletal:        General: No tenderness or deformity. Normal range of motion.     Cervical back: Normal range of motion.  Lymphadenopathy:     Cervical: No cervical adenopathy.  Skin:    General: Skin is warm and dry.     Findings: No erythema or rash.  Neurological:     Mental Status: He is  alert and oriented to person, place, and time.  Psychiatric:        Behavior: Behavior normal.        Thought Content: Thought content normal.        Judgment: Judgment normal.      Lab Results  Component Value Date   WBC 5.1 04/06/2020   HGB 11.4 (L) 04/06/2020   HCT 35.3 (L) 04/06/2020   MCV 92.2 04/06/2020   PLT 235 04/06/2020     Chemistry      Component Value Date/Time   NA 141 04/06/2020 0844   K 3.4 (L) 04/06/2020 0844   CL 102 04/06/2020 0844  CO2 28 04/06/2020 0844   BUN 12 04/06/2020 0844   CREATININE 0.67 04/06/2020 0844   CREATININE 0.75 03/08/2020 1058      Component Value Date/Time   CALCIUM 9.0 04/06/2020 0844   ALKPHOS 1,128 (H) 04/06/2020 0844   AST 92 (H) 04/06/2020 0844   ALT 122 (H) 04/06/2020 0844   BILITOT 1.3 (H) 04/06/2020 0844      Impression and Plan: Melvin [name] is a 59 year old white male.  He has metastatic adenocarcinoma the pancreas.  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  We did send off molecular markers.  There really is not much for [place] to go on.  But I do worry about that he does have the K-ras mutation.  This may indicate that he may have less of response to chemotherapy.  I know that for lung cancers that have the K-ras mutation, there is hopefully a new drug on the horizon.  I does wonder if this might be an option for [place] to think about if we need to with respect to response to treatment.  We will have to see what his CA 19-9 is.  Hopefully, he will be able to gain a little bit of weight.  Quality of life clearly is our goal.  We will plan to get him back in a couple more weeks for his third cycle of treatment.   Volanda Napoleon, MD 4/20/20219:21 AM

## 2020-04-06 NOTE — Progress Notes (Signed)
Dr. Marin Olp has reviewed the patient's LFTs. Okay to proceed with treatment today.

## 2020-04-07 ENCOUNTER — Telehealth: Payer: Self-pay | Admitting: *Deleted

## 2020-04-07 LAB — CANCER ANTIGEN 19-9: CA 19-9: 10000 U/mL — ABNORMAL HIGH (ref 0–35)

## 2020-04-07 NOTE — Telephone Encounter (Signed)
Notified pt of lab results. Pt and wife( on speaker phone) verbalized understanding, no further concerns at this time.

## 2020-04-07 NOTE — Telephone Encounter (Signed)
-----   Message from Volanda Napoleon, MD sent at 04/07/2020 11:31 AM EDT ----- Call - the tumor marker came down by 90%!!!!!  This is a miracle from God!!!!  Melvin [name]

## 2020-04-08 ENCOUNTER — Inpatient Hospital Stay: Payer: PRIVATE HEALTH INSURANCE

## 2020-04-08 ENCOUNTER — Other Ambulatory Visit: Payer: Self-pay

## 2020-04-08 VITALS — BP 137/90 | HR 93 | Temp 96.9°F

## 2020-04-08 DIAGNOSIS — C259 Malignant neoplasm of pancreas, unspecified: Secondary | ICD-10-CM

## 2020-04-08 DIAGNOSIS — C787 Secondary malignant neoplasm of liver and intrahepatic bile duct: Secondary | ICD-10-CM

## 2020-04-08 DIAGNOSIS — Z5111 Encounter for antineoplastic chemotherapy: Secondary | ICD-10-CM | POA: Diagnosis not present

## 2020-04-08 MED ORDER — SODIUM CHLORIDE 0.9% FLUSH
10.0000 mL | INTRAVENOUS | Status: DC | PRN
  Administered 2020-04-08: 10 mL
  Filled 2020-04-08: qty 10

## 2020-04-08 MED ORDER — HEPARIN SOD (PORK) LOCK FLUSH 100 UNIT/ML IV SOLN
500.0000 [IU] | Freq: Once | INTRAVENOUS | Status: AC | PRN
  Administered 2020-04-08: 500 [IU]
  Filled 2020-04-08: qty 5

## 2020-04-08 NOTE — Patient Instructions (Signed)
Fluorouracil, 5-FU injection What is this medicine? FLUOROURACIL, 5-FU (flure oh YOOR a sil) is a chemotherapy drug. It slows the growth of cancer cells. This medicine is used to treat many types of cancer like breast cancer, colon or rectal cancer, pancreatic cancer, and stomach cancer. This medicine may be used for other purposes; ask your health care provider or pharmacist if you have questions. COMMON BRAND NAME(S): Adrucil What should I tell my health care provider before I take this medicine? They need to know if you have any of these conditions:  blood disorders  dihydropyrimidine dehydrogenase (DPD) deficiency  infection (especially a virus infection such as chickenpox, cold sores, or herpes)  kidney disease  liver disease  malnourished, poor nutrition  recent or ongoing radiation therapy  an unusual or allergic reaction to fluorouracil, other chemotherapy, other medicines, foods, dyes, or preservatives  pregnant or trying to get pregnant  breast-feeding How should I use this medicine? This drug is given as an infusion or injection into a vein. It is administered in a hospital or clinic by a specially trained health care professional. Talk to your pediatrician regarding the use of this medicine in children. Special care may be needed. Overdosage: If you think you have taken too much of this medicine contact a poison control center or emergency room at once. NOTE: This medicine is only for you. Do not share this medicine with others. What if I miss a dose? It is important not to miss your dose. Call your doctor or health care professional if you are unable to keep an appointment. What may interact with this medicine?  allopurinol  cimetidine  dapsone  digoxin  hydroxyurea  leucovorin  levamisole  medicines for seizures like ethotoin, fosphenytoin, phenytoin  medicines to increase blood counts like filgrastim, pegfilgrastim, sargramostim  medicines that  treat or prevent blood clots like warfarin, enoxaparin, and dalteparin  methotrexate  metronidazole  pyrimethamine  some other chemotherapy drugs like busulfan, cisplatin, estramustine, vinblastine  trimethoprim  trimetrexate  vaccines Talk to your doctor or health care professional before taking any of these medicines:  acetaminophen  aspirin  ibuprofen  ketoprofen  naproxen This list may not describe all possible interactions. Give your health care provider a list of all the medicines, herbs, non-prescription drugs, or dietary supplements you use. Also tell them if you smoke, drink alcohol, or use illegal drugs. Some items may interact with your medicine. What should I watch for while using this medicine? Visit your doctor for checks on your progress. This drug may make you feel generally unwell. This is not uncommon, as chemotherapy can affect healthy cells as well as cancer cells. Report any side effects. Continue your course of treatment even though you feel ill unless your doctor tells you to stop. In some cases, you may be given additional medicines to help with side effects. Follow all directions for their use. Call your doctor or health care professional for advice if you get a fever, chills or sore throat, or other symptoms of a cold or flu. Do not treat yourself. This drug decreases your body's ability to fight infections. Try to avoid being around people who are sick. This medicine may increase your risk to bruise or bleed. Call your doctor or health care professional if you notice any unusual bleeding. Be careful brushing and flossing your teeth or using a toothpick because you may get an infection or bleed more easily. If you have any dental work done, tell your dentist you are   receiving this medicine. Avoid taking products that contain aspirin, acetaminophen, ibuprofen, naproxen, or ketoprofen unless instructed by your doctor. These medicines may hide a fever. Do not  become pregnant while taking this medicine. Women should inform their doctor if they wish to become pregnant or think they might be pregnant. There is a potential for serious side effects to an unborn child. Talk to your health care professional or pharmacist for more information. Do not breast-feed an infant while taking this medicine. Men should inform their doctor if they wish to father a child. This medicine may lower sperm counts. Do not treat diarrhea with over the counter products. Contact your doctor if you have diarrhea that lasts more than 2 days or if it is severe and watery. This medicine can make you more sensitive to the sun. Keep out of the sun. If you cannot avoid being in the sun, wear protective clothing and use sunscreen. Do not use sun lamps or tanning beds/booths. What side effects may I notice from receiving this medicine? Side effects that you should report to your doctor or health care professional as soon as possible:  allergic reactions like skin rash, itching or hives, swelling of the face, lips, or tongue  low blood counts - this medicine may decrease the number of white blood cells, red blood cells and platelets. You may be at increased risk for infections and bleeding.  signs of infection - fever or chills, cough, sore throat, pain or difficulty passing urine  signs of decreased platelets or bleeding - bruising, pinpoint red spots on the skin, black, tarry stools, blood in the urine  signs of decreased red blood cells - unusually weak or tired, fainting spells, lightheadedness  breathing problems  changes in vision  chest pain  mouth sores  nausea and vomiting  pain, swelling, redness at site where injected  pain, tingling, numbness in the hands or feet  redness, swelling, or sores on hands or feet  stomach pain  unusual bleeding Side effects that usually do not require medical attention (report to your doctor or health care professional if they  continue or are bothersome):  changes in finger or toe nails  diarrhea  dry or itchy skin  hair loss  headache  loss of appetite  sensitivity of eyes to the light  stomach upset  unusually teary eyes This list may not describe all possible side effects. Call your doctor for medical advice about side effects. You may report side effects to FDA at 1-800-FDA-1088. Where should I keep my medicine? This drug is given in a hospital or clinic and will not be stored at home. NOTE: This sheet is a summary. It may not cover all possible information. If you have questions about this medicine, talk to your doctor, pharmacist, or health care provider.  2020 [employer]vier/[employer] Standard (2008-04-08 13:53:16)  

## 2020-04-13 ENCOUNTER — Telehealth: Payer: Self-pay | Admitting: Hematology & Oncology

## 2020-04-13 ENCOUNTER — Other Ambulatory Visit: Payer: Self-pay

## 2020-04-13 MED ORDER — ATORVASTATIN CALCIUM 10 MG PO TABS: 10.0000 mg | ORAL_TABLET | Freq: Every day | ORAL | 1 refills | Status: DC

## 2020-04-13 MED ORDER — TOUJEO MAX SOLOSTAR 300 UNIT/ML ~~LOC~~ SOPN: 25.0000 [IU] | PEN_INJECTOR | Freq: Every day | SUBCUTANEOUS | 0 refills | Status: DC

## 2020-04-13 MED ORDER — METFORMIN HCL 1000 MG PO TABS: 1000.0000 mg | ORAL_TABLET | Freq: Two times a day (BID) | ORAL | 1 refills | Status: DC

## 2020-04-13 MED ORDER — LOSARTAN POTASSIUM 25 MG PO TABS: 25.0000 mg | ORAL_TABLET | Freq: Every day | ORAL | 1 refills | Status: DC

## 2020-04-13 NOTE — Telephone Encounter (Signed)
[facility]  interoffice mail to:  [place] [id number]

## 2020-04-13 NOTE — Progress Notes (Unsigned)
Pharmacist Chemotherapy Monitoring - Follow Up Assessment    I verify that I have reviewed each item in the below checklist:  . Regimen for the patient is scheduled for the appropriate day and plan matches scheduled date. Marland Kitchen Appropriate non-routine labs are ordered dependent on drug ordered. . If applicable, additional medications reviewed and ordered per protocol based on lifetime cumulative doses and/or treatment regimen.   Plan for follow-up and/or issues identified: No . I-vent associated with next due treatment: No . MD and/or nursing notified: No  Ilamae Geng, [name] 04/13/2020 9:11 AM

## 2020-04-16 ENCOUNTER — Other Ambulatory Visit: Payer: Self-pay | Admitting: *Deleted

## 2020-04-16 ENCOUNTER — Other Ambulatory Visit: Payer: Self-pay | Admitting: Osteopathic Medicine

## 2020-04-16 ENCOUNTER — Telehealth: Payer: Self-pay | Admitting: *Deleted

## 2020-04-16 MED ORDER — PROCHLORPERAZINE MALEATE 10 MG PO TABS: 10.0000 mg | ORAL_TABLET | Freq: Four times a day (QID) | ORAL | 0 refills | Status: DC | PRN

## 2020-04-16 MED ORDER — GABAPENTIN 300 MG PO CAPS: 300.0000 mg | ORAL_CAPSULE | Freq: Three times a day (TID) | ORAL | 1 refills | Status: DC

## 2020-04-16 MED ORDER — LORAZEPAM 0.5 MG PO TABS: 0.5000 mg | ORAL_TABLET | Freq: Three times a day (TID) | ORAL | 0 refills | Status: DC

## 2020-04-16 MED ORDER — CREON 24000-76000 UNITS PO CPEP: 72000.0000 [IU] | ORAL_CAPSULE | Freq: Three times a day (TID) | ORAL | 1 refills | Status: DC

## 2020-04-16 MED FILL — LORazepam 0.5 MG TABS: 0.5 | 10 days supply | Qty: 30 | Fill #0

## 2020-04-16 MED FILL — CREON DR 24,000 UNITS CAP: 24000-76000 | 30 days supply | Qty: 90 | Fill #0

## 2020-04-16 MED FILL — GABAPENTIN 300 MG CAPSULE: 300 | 30 days supply | Qty: 90 | Fill #0

## 2020-04-16 MED FILL — PROCHLORPERAZINE 10 MG TAB: 10 | 7 days supply | Qty: 30 | Fill #0

## 2020-04-16 NOTE — Telephone Encounter (Signed)
Call received from patient and his wife confused about his medications and what refills he needs.  Medications reviewed with patient and his wife.  Pt states that he needs a refill on creon, gabapentin, valium, ativan, reglan and compazine.  Dr. Marin Olp notified.  Order received for pt to stop Reglan and Valium.  Refills for creon, gabapentin, ativan and compazine sent to [facility] per pt.'s wifes reqeust.

## 2020-04-20 ENCOUNTER — Inpatient Hospital Stay: Payer: PRIVATE HEALTH INSURANCE | Attending: Hematology & Oncology

## 2020-04-20 ENCOUNTER — Encounter: Payer: Self-pay | Admitting: *Deleted

## 2020-04-20 ENCOUNTER — Ambulatory Visit (HOSPITAL_BASED_OUTPATIENT_CLINIC_OR_DEPARTMENT_OTHER)
Admission: RE | Admit: 2020-04-20 | Discharge: 2020-04-20 | Disposition: A | Discharge status: Home / Self Care | Payer: PRIVATE HEALTH INSURANCE | Source: Ambulatory Visit | Attending: Hematology & Oncology | Admitting: Hematology & Oncology

## 2020-04-20 ENCOUNTER — Inpatient Hospital Stay: Payer: PRIVATE HEALTH INSURANCE

## 2020-04-20 ENCOUNTER — Other Ambulatory Visit: Payer: Self-pay

## 2020-04-20 ENCOUNTER — Inpatient Hospital Stay (HOSPITAL_BASED_OUTPATIENT_CLINIC_OR_DEPARTMENT_OTHER): Payer: PRIVATE HEALTH INSURANCE | Admitting: Hematology & Oncology

## 2020-04-20 ENCOUNTER — Telehealth: Payer: Self-pay | Admitting: Family

## 2020-04-20 ENCOUNTER — Other Ambulatory Visit: Payer: Self-pay | Admitting: Family

## 2020-04-20 VITALS — BP 147/92 | HR 114 | Temp 97.1°F | Resp 19 | Wt 223.5 lb

## 2020-04-20 DIAGNOSIS — Z95828 Presence of other vascular implants and grafts: Secondary | ICD-10-CM

## 2020-04-20 DIAGNOSIS — I2602 Saddle embolus of pulmonary artery with acute cor pulmonale: Secondary | ICD-10-CM

## 2020-04-20 DIAGNOSIS — C259 Malignant neoplasm of pancreas, unspecified: Secondary | ICD-10-CM | POA: Insufficient documentation

## 2020-04-20 DIAGNOSIS — C787 Secondary malignant neoplasm of liver and intrahepatic bile duct: Secondary | ICD-10-CM | POA: Diagnosis present

## 2020-04-20 DIAGNOSIS — Z5111 Encounter for antineoplastic chemotherapy: Secondary | ICD-10-CM | POA: Insufficient documentation

## 2020-04-20 DIAGNOSIS — C779 Secondary and unspecified malignant neoplasm of lymph node, unspecified: Secondary | ICD-10-CM | POA: Insufficient documentation

## 2020-04-20 DIAGNOSIS — I2699 Other pulmonary embolism without acute cor pulmonale: Secondary | ICD-10-CM

## 2020-04-20 DIAGNOSIS — C7800 Secondary malignant neoplasm of unspecified lung: Secondary | ICD-10-CM | POA: Insufficient documentation

## 2020-04-20 HISTORY — DX: Other pulmonary embolism without acute cor pulmonale: I26.99

## 2020-04-20 LAB — CMP (CANCER CENTER ONLY)
ALT: 97 U/L — ABNORMAL HIGH (ref 0–44)
AST: 62 U/L — ABNORMAL HIGH (ref 15–41)
Albumin: 3.6 g/dL (ref 3.5–5.0)
Alkaline Phosphatase: 1095 U/L — ABNORMAL HIGH (ref 38–126)
Anion gap: 11 (ref 5–15)
BUN: 17 mg/dL (ref 6–20)
CO2: 23 mmol/L (ref 22–32)
Calcium: 8.7 mg/dL — ABNORMAL LOW (ref 8.9–10.3)
Chloride: 104 mmol/L (ref 98–111)
Creatinine: 0.68 mg/dL (ref 0.61–1.24)
GFR, Est AFR Am: >60 mL/min (ref >60)
GFR, Estimated: >60 mL/min (ref >60)
Glucose, Bld: 360 mg/dL — ABNORMAL HIGH (ref 70–99)
Potassium: 3.6 mmol/L (ref 3.5–5.1)
Sodium: 138 mmol/L (ref 135–145)
Total Bilirubin: 1.3 mg/dL — ABNORMAL HIGH (ref 0.3–1.2)
Total Protein: 6.4 g/dL — ABNORMAL LOW (ref 6.5–8.1)

## 2020-04-20 LAB — CBC WITH DIFFERENTIAL (CANCER CENTER ONLY)
Abs Immature Granulocytes: 0.02 10*3/uL (ref 0.00–0.07)
Basophils Absolute: 0 10*3/uL (ref 0.0–0.1)
Basophils Relative: 1 %
Eosinophils Absolute: 0 10*3/uL (ref 0.0–0.5)
Eosinophils Relative: 1 %
HCT: 35 % — ABNORMAL LOW (ref 39.0–52.0)
Hemoglobin: 11.2 g/dL — ABNORMAL LOW (ref 13.0–17.0)
Immature Granulocytes: 1 %
Lymphocytes Relative: 23 %
Lymphs Abs: 0.9 10*3/uL (ref 0.7–4.0)
MCH: 29.9 pg (ref 26.0–34.0)
MCHC: 32 g/dL (ref 30.0–36.0)
MCV: 93.3 fL (ref 80.0–100.0)
Monocytes Absolute: 0.5 10*3/uL (ref 0.1–1.0)
Monocytes Relative: 12 %
Neutro Abs: 2.5 10*3/uL (ref 1.7–7.7)
Neutrophils Relative %: 62 %
Platelet Count: 188 10*3/uL (ref 150–400)
RBC: 3.75 MIL/uL — ABNORMAL LOW (ref 4.22–5.81)
RDW: 16.1 % — ABNORMAL HIGH (ref 11.5–15.5)
WBC Count: 3.9 10*3/uL — ABNORMAL LOW (ref 4.0–10.5)
nRBC: 0 % (ref 0.0–0.2)

## 2020-04-20 LAB — LACTATE DEHYDROGENASE: LDH: 248 U/L — ABNORMAL HIGH (ref 98–192)

## 2020-04-20 IMAGING — CT CT ANGIO CHEST
2 of 8 series · 18 of 36 positions shown · IV contrast (Omnipaque)
Comparison: Chest CTA [DATE].

CLINICAL DATA: 58-year-old male with history of pancreatic cancer.
Worsening shortness of breath.

EXAM:
CT ANGIOGRAPHY CHEST WITH CONTRAST
TECHNIQUE: Multidetector CT imaging of the chest was performed using the
standard protocol during bolus administration of intravenous
contrast. Multiplanar CT image reconstructions and MIPs were
obtained to evaluate the vascular anatomy.
CONTRAST:  100mL OMNIPAQUE IOHEXOL 350 MG/ML SOLN

[Series 6: pe thins · axial · 0.74mm/px · z∈[-362,-49]mm · 17 of 351 slices shown]
[im 19/351  lung]
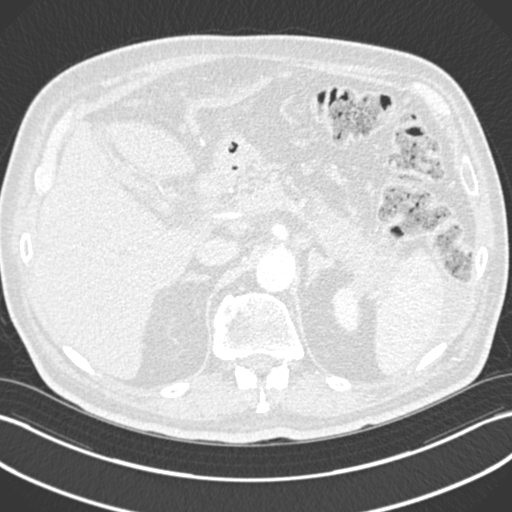
[im 37/351  mediastinal]
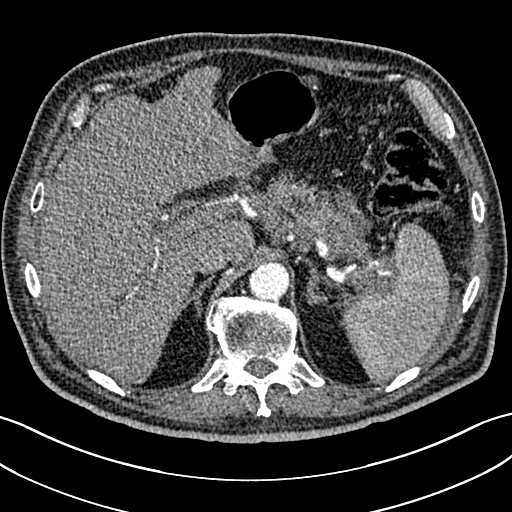
[im 56/351  lung]
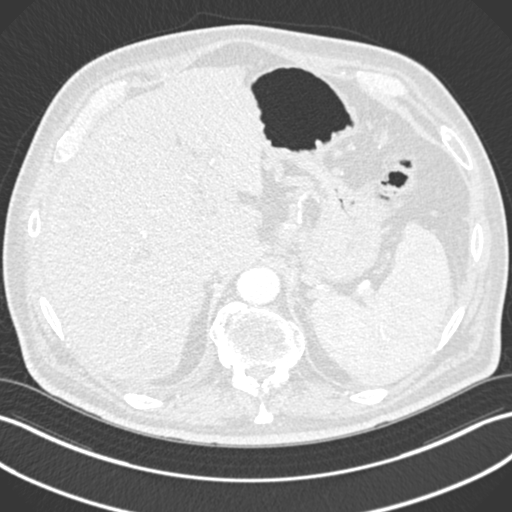
[im 74/351  mediastinal]
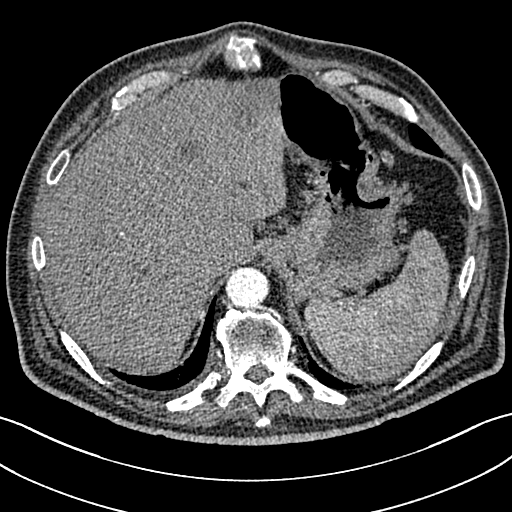
[im 93/351  lung]
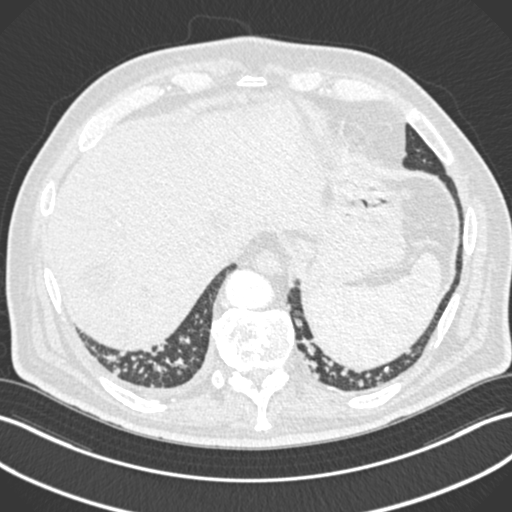
[im 111/351  mediastinal]
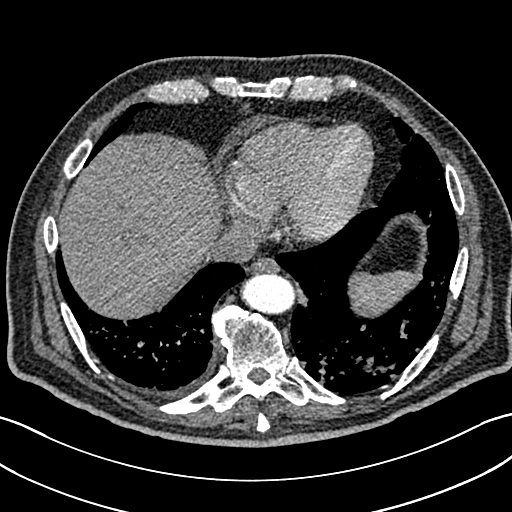
[im 129/351  lung]
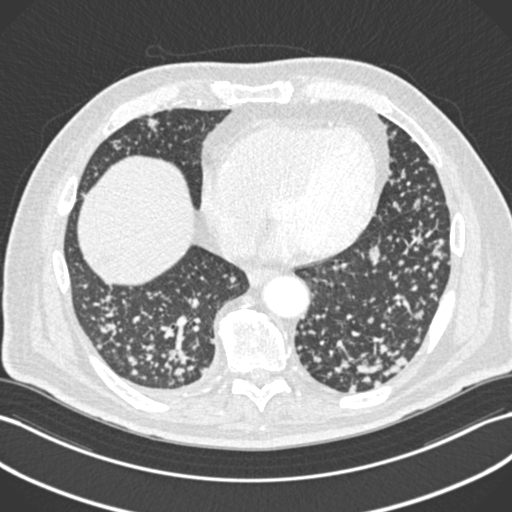
[im 148/351  mediastinal]
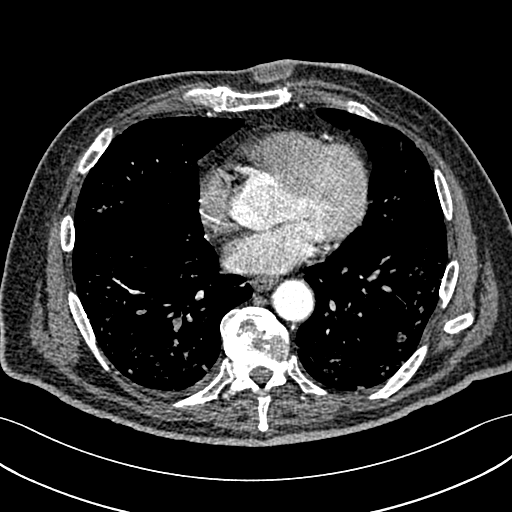
[im 185/351  lung]
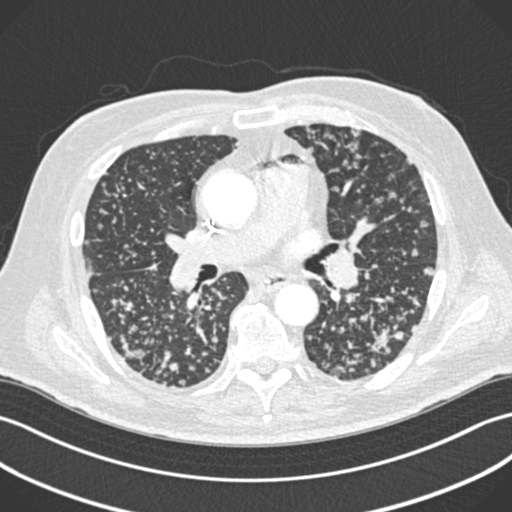
[im 203/351  mediastinal]
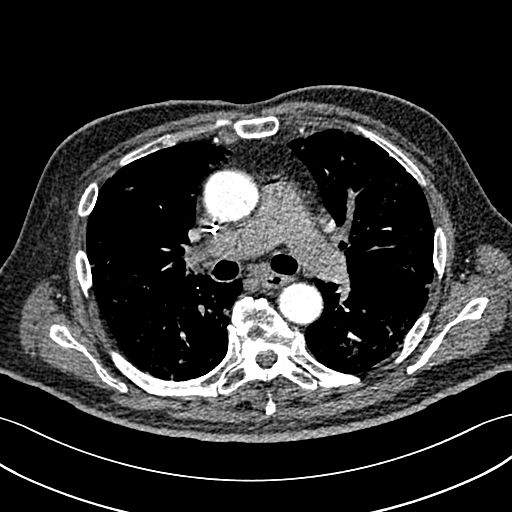
[im 222/351  lung]
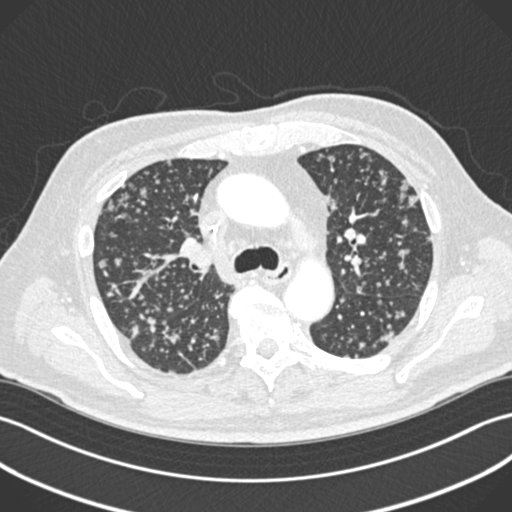
[im 240/351  mediastinal]
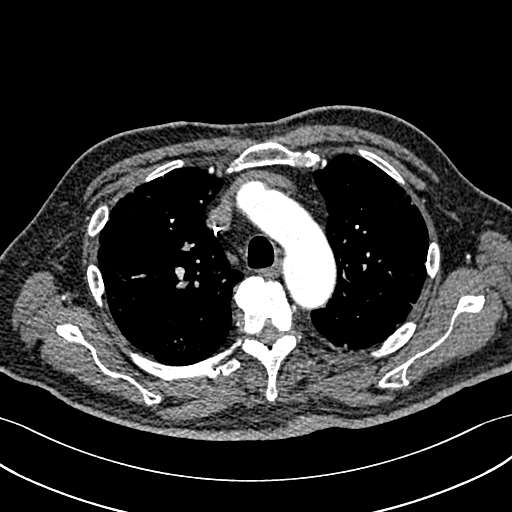
[im 258/351  lung]
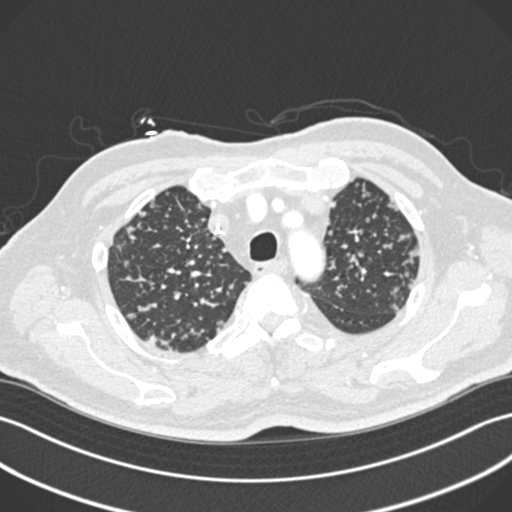
[im 277/351  mediastinal]
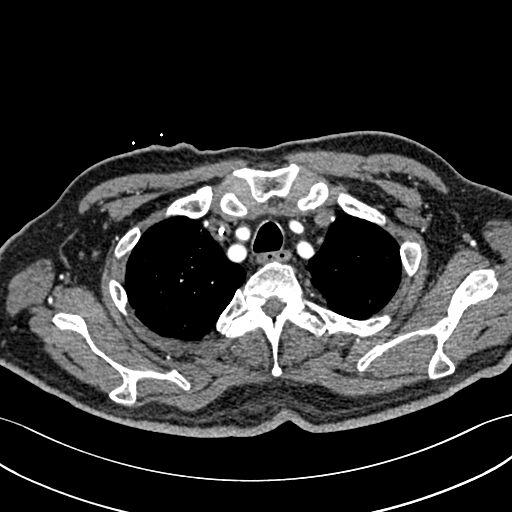
[im 295/351  lung]
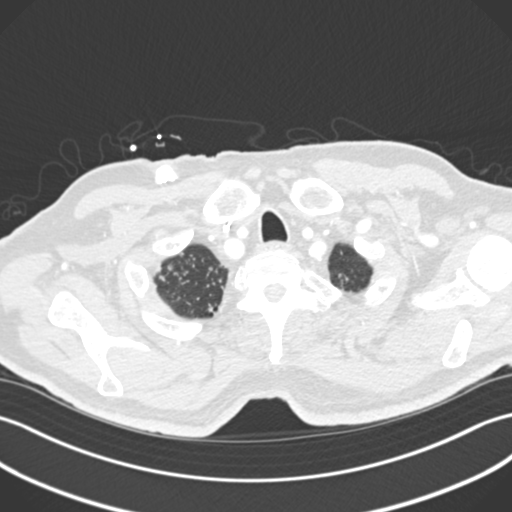
[im 314/351  mediastinal]
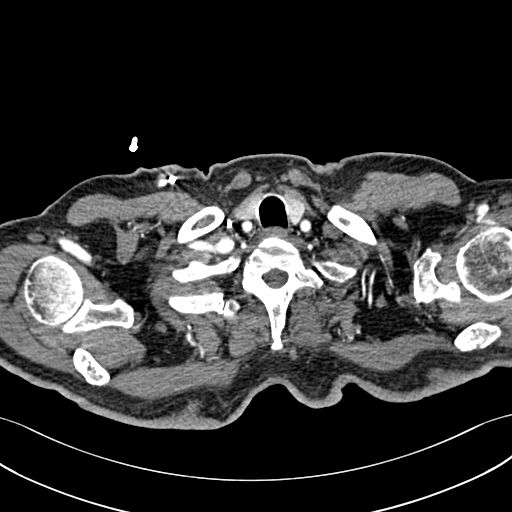
[im 332/351  lung]
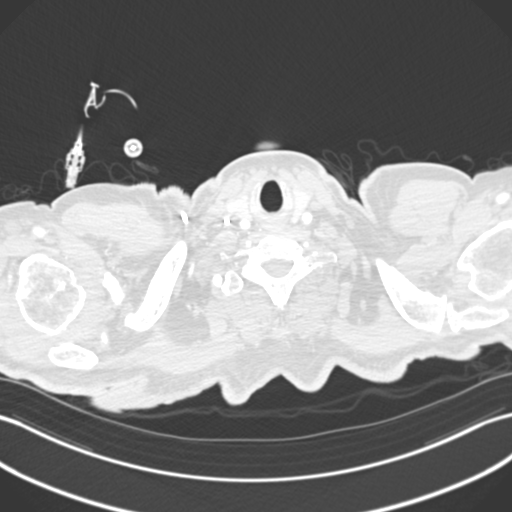

[Series 7: pe coronal mpr · coronal · 0.69mm/px · 1 of 164 slices shown]
[im 82/164  mediastinal]
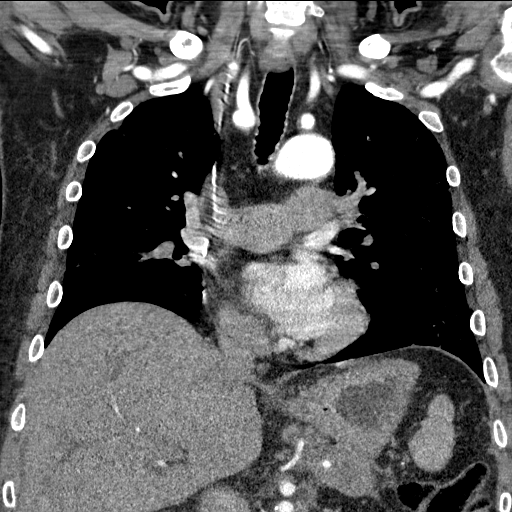

[18 of 36 positions shown; findings below may reference images not displayed]

FINDINGS: Cardiovascular: Large saddle embolus. Multiple other filling defects
are noted throughout the main, lobar, segmental and subsegmental
sized branches of the pulmonary arteries throughout the lungs
bilaterally. The majority of these appear to be nonocclusive. Heart
size is normal. There is no significant pericardial fluid,
thickening or pericardial calcification. There is aortic
atherosclerosis, as well as atherosclerosis of the great vessels of
the mediastinum and the coronary arteries, including calcified
atherosclerotic plaque in the left main and left anterior descending
coronary arteries. Right internal jugular single-lumen porta cath
with tip terminating in the right atrium.

Mediastinum/Nodes: No pathologically enlarged mediastinal or hilar
lymph nodes. Esophagus is unremarkable in appearance. No axillary
lymphadenopathy.

Lungs/Pleura: Innumerable small pulmonary nodules scattered
throughout the lungs bilaterally, many of which are centrally
cavitary, similar to the prior examination from [DATE]. The
largest of these nodules is in the superior segment of the left
lower lobe (axial image 44 of series 5) measuring 1.8 x 1.5 cm. No
acute consolidative airspace disease. Trace right pleural effusion.
No left pleural effusion.

Upper Abdomen: Multiple ill-defined hypovascular lesions are again
noted throughout the liver, largest of which is in the left lobe of
the liver (axial image 252 of series 6) measuring 5.7 x 2.7 cm.

Musculoskeletal: Multiple sclerotic lesions noted throughout the
visualized axial and appendicular skeleton, concerning for
metastatic disease.

Review of the MIP images confirms the above findings.
IMPRESSION: 1. Study is positive for saddle embolus and large burden of
predominantly nonocclusive clot throughout main, lobar, segmental
and subsegmental sized pulmonary arteries bilaterally.
2. Widespread metastatic disease to the lungs and liver
redemonstrated, as above.
3. Increasingly evident areas of sclerosis throughout the visualized
axial and appendicular skeleton, compatible with metastatic disease
to the bones.
4. Small right pleural effusion lying dependently.
5. Aortic atherosclerosis, in addition to left main and left
anterior descending coronary artery disease.

Aortic Atherosclerosis ([K9]-[K9]).

## 2020-04-20 IMAGING — CT CT ANGIO CHEST
2 of 8 series · 18 of 36 positions shown · IV contrast (Omnipaque)
Comparison: Chest CTA [DATE].

CLINICAL DATA: 58-year-old male with history of pancreatic cancer.
Worsening shortness of breath.

EXAM:
CT ANGIOGRAPHY CHEST WITH CONTRAST
TECHNIQUE: Multidetector CT imaging of the chest was performed using the
standard protocol during bolus administration of intravenous
contrast. Multiplanar CT image reconstructions and MIPs were
obtained to evaluate the vascular anatomy.
CONTRAST:  100mL OMNIPAQUE IOHEXOL 350 MG/ML SOLN

[Series 6: pe thins · axial · 0.78mm/px · z∈[-276,-15]mm · 17 of 293 slices shown]
[im 16/293  lung]
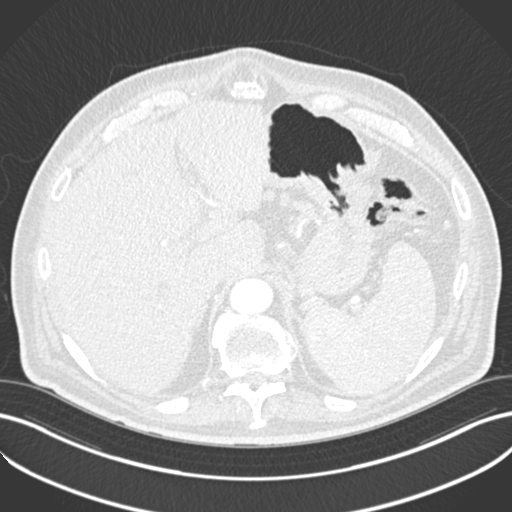
[im 31/293  mediastinal]
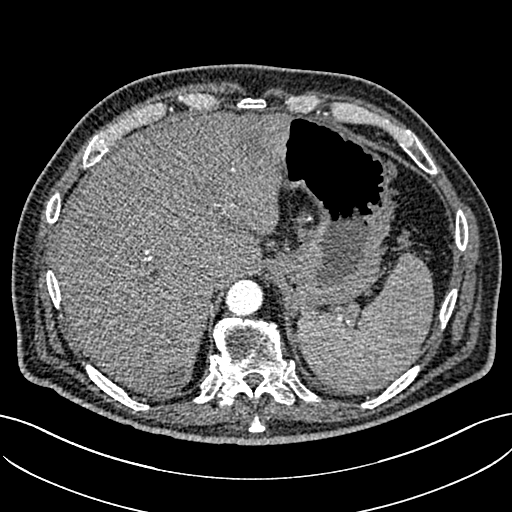
[im 47/293  lung]
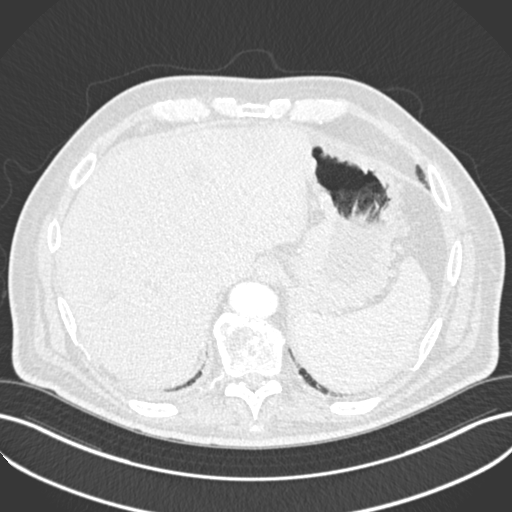
[im 62/293  mediastinal]
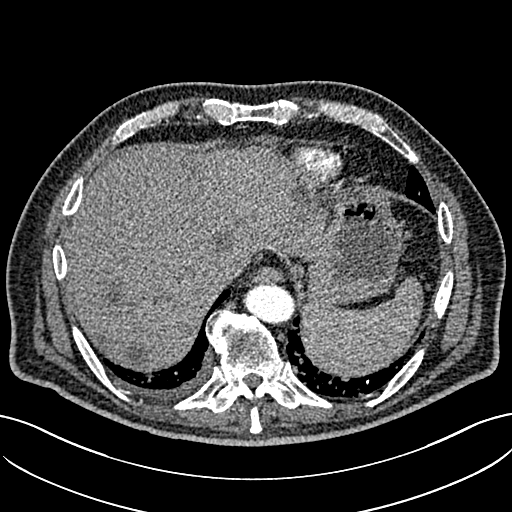
[im 77/293  lung]
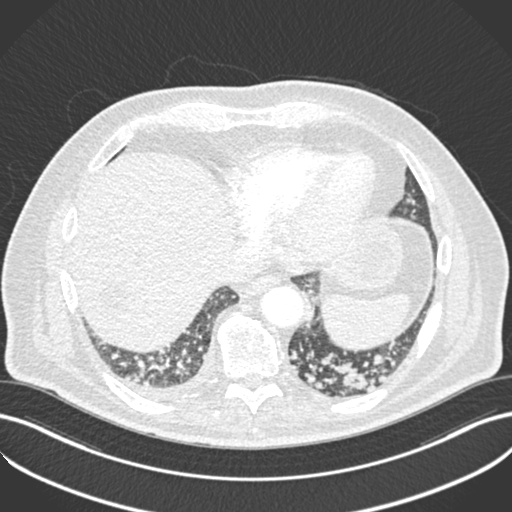
[im 93/293  mediastinal]
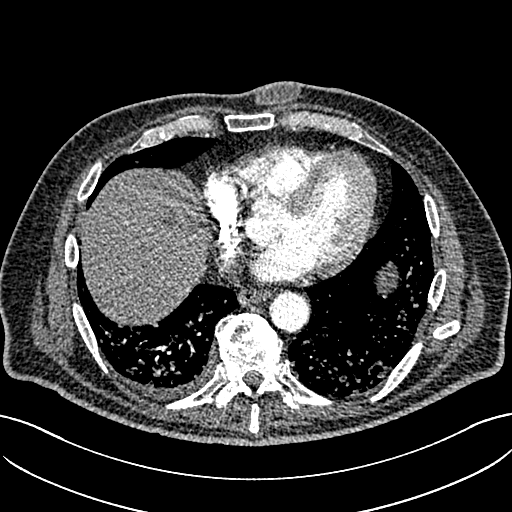
[im 108/293  lung]
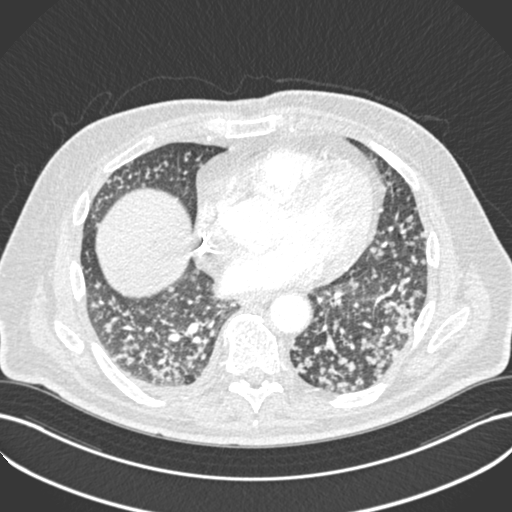
[im 123/293  mediastinal]
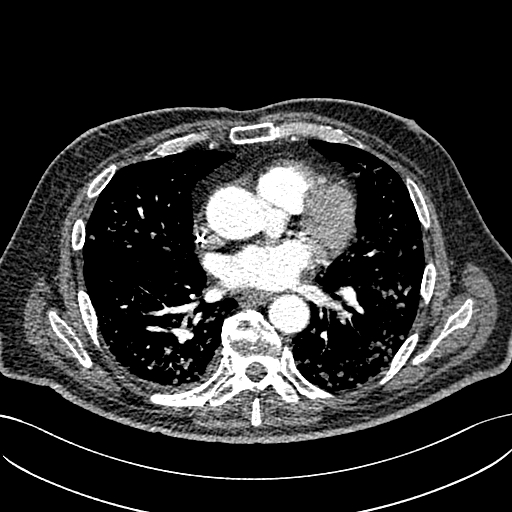
[im 154/293  lung]
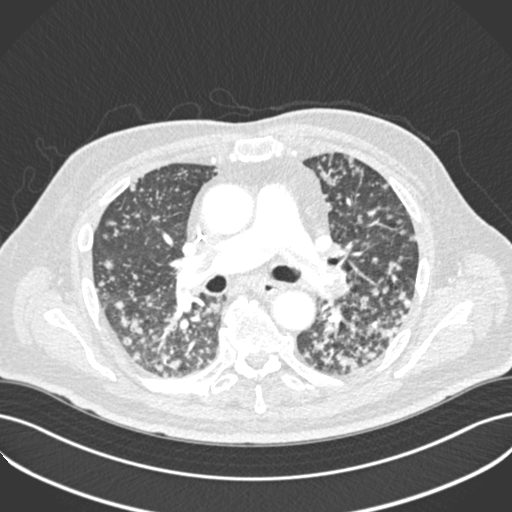
[im 170/293  mediastinal]
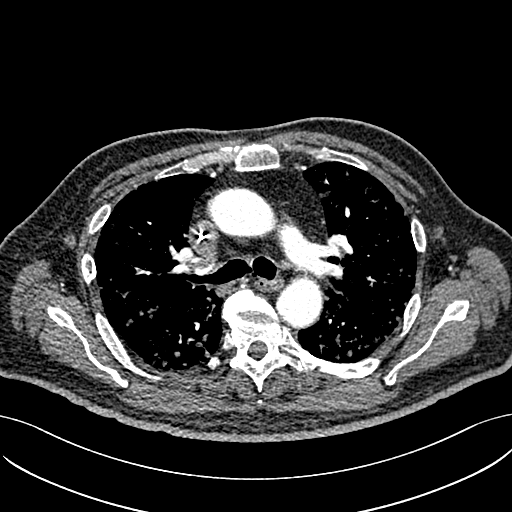
[im 185/293  lung]
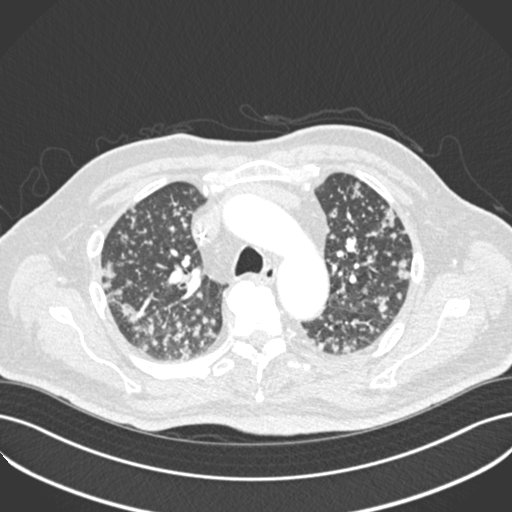
[im 200/293  mediastinal]
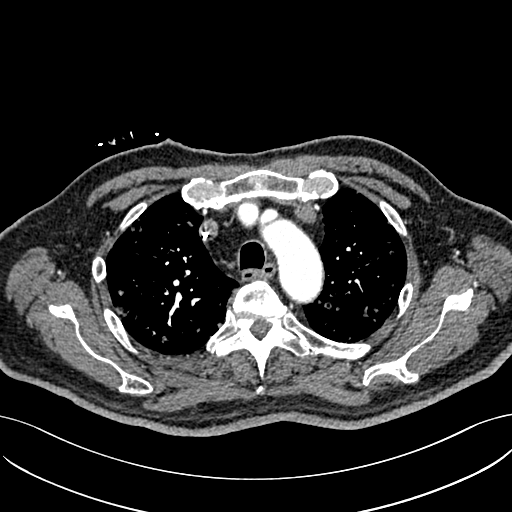
[im 216/293  lung]
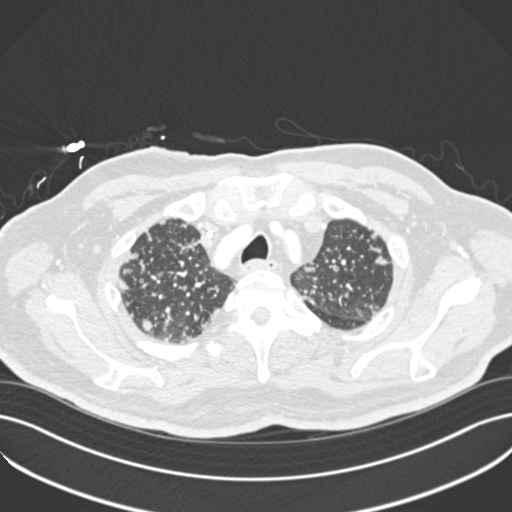
[im 231/293  mediastinal]
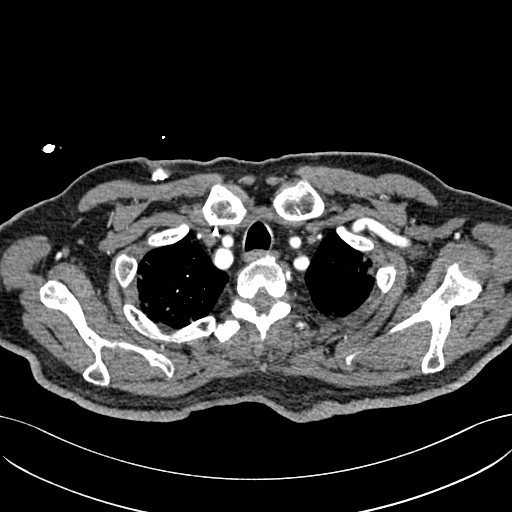
[im 246/293  lung]
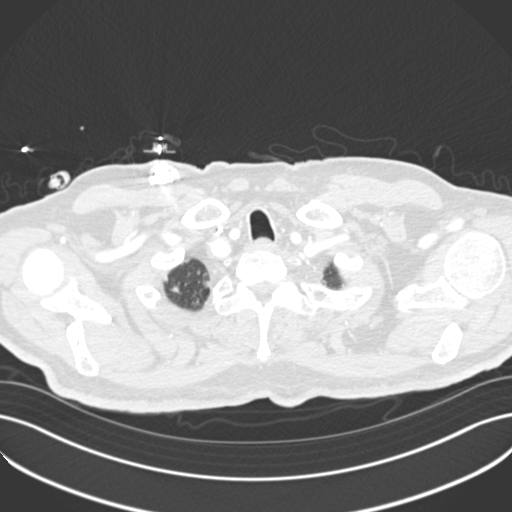
[im 262/293  mediastinal]
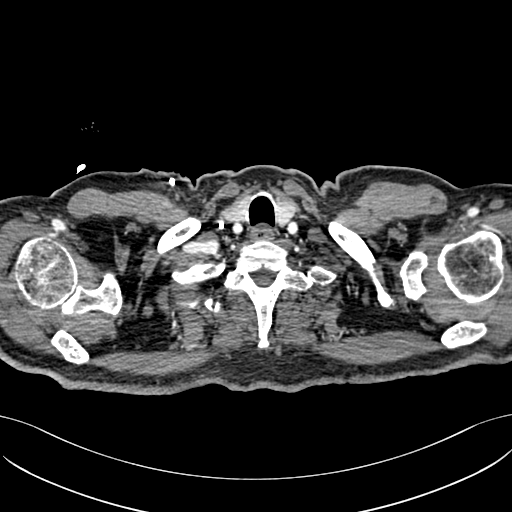
[im 277/293  lung]
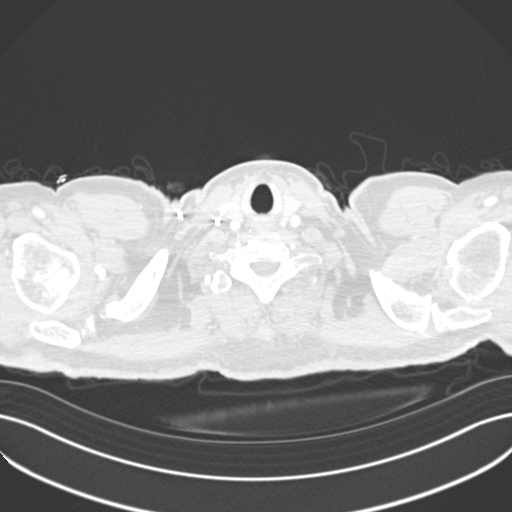

[Series 7: pe coronal mpr · coronal · 0.60mm/px · 1 of 157 slices shown]
[im 79/157  mediastinal]
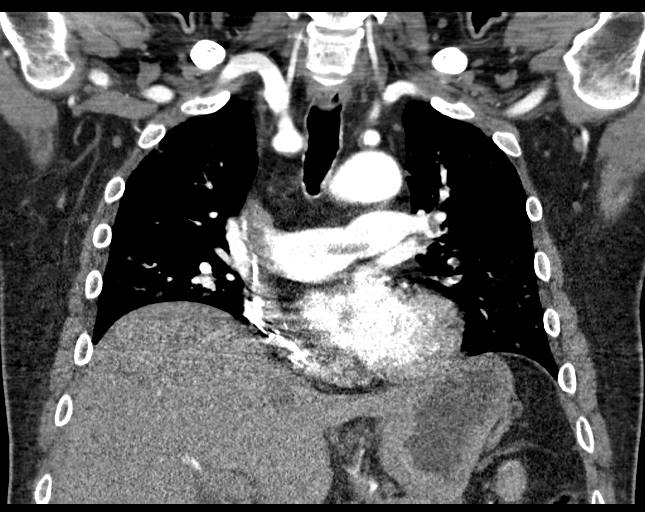

[18 of 36 positions shown; findings below may reference images not displayed]

FINDINGS: Cardiovascular: Large saddle embolus. Multiple other filling defects
are noted throughout the main, lobar, segmental and subsegmental
sized branches of the pulmonary arteries throughout the lungs
bilaterally. The majority of these appear to be nonocclusive. Heart
size is normal. There is no significant pericardial fluid,
thickening or pericardial calcification. There is aortic
atherosclerosis, as well as atherosclerosis of the great vessels of
the mediastinum and the coronary arteries, including calcified
atherosclerotic plaque in the left main and left anterior descending
coronary arteries. Right internal jugular single-lumen porta cath
with tip terminating in the right atrium.

Mediastinum/Nodes: No pathologically enlarged mediastinal or hilar
lymph nodes. Esophagus is unremarkable in appearance. No axillary
lymphadenopathy.

Lungs/Pleura: Innumerable small pulmonary nodules scattered
throughout the lungs bilaterally, many of which are centrally
cavitary, similar to the prior examination from [DATE]. The
largest of these nodules is in the superior segment of the left
lower lobe (axial image 44 of series 5) measuring 1.8 x 1.5 cm. No
acute consolidative airspace disease. Trace right pleural effusion.
No left pleural effusion.

Upper Abdomen: Multiple ill-defined hypovascular lesions are again
noted throughout the liver, largest of which is in the left lobe of
the liver (axial image 252 of series 6) measuring 5.7 x 2.7 cm.

Musculoskeletal: Multiple sclerotic lesions noted throughout the
visualized axial and appendicular skeleton, concerning for
metastatic disease.

Review of the MIP images confirms the above findings.
IMPRESSION: 1. Study is positive for saddle embolus and large burden of
predominantly nonocclusive clot throughout main, lobar, segmental
and subsegmental sized pulmonary arteries bilaterally.
2. Widespread metastatic disease to the lungs and liver
redemonstrated, as above.
3. Increasingly evident areas of sclerosis throughout the visualized
axial and appendicular skeleton, compatible with metastatic disease
to the bones.
4. Small right pleural effusion lying dependently.
5. Aortic atherosclerosis, in addition to left main and left
anterior descending coronary artery disease.

Aortic Atherosclerosis ([K9]-[K9]).

## 2020-04-20 MED ORDER — HEPARIN SOD (PORK) LOCK FLUSH 100 UNIT/ML IV SOLN
500.0000 [IU] | Freq: Once | INTRAVENOUS | Status: AC
  Administered 2020-04-20: 500 [IU] via INTRAVENOUS
  Filled 2020-04-20: qty 5

## 2020-04-20 MED ORDER — RIVAROXABAN (XARELTO) VTE STARTER PACK (15 & 20 MG)
ORAL_TABLET | ORAL | 0 refills | Status: DC
Start: 2020-04-20 — End: 2020-04-21

## 2020-04-20 MED ORDER — SODIUM CHLORIDE 0.9% FLUSH
10.0000 mL | INTRAVENOUS | Status: AC | PRN
  Administered 2020-04-20: 10 mL via INTRAVENOUS
  Filled 2020-04-20: qty 10

## 2020-04-20 MED ORDER — IOHEXOL 350 MG/ML SOLN
100.0000 mL | Freq: Once | INTRAVENOUS | Status: AC | PRN
  Administered 2020-04-20: 11:00:00 100 mL via INTRAVENOUS

## 2020-04-20 NOTE — Telephone Encounter (Signed)
I was able to speak with Melvin [name] and let him know his CT angio was positive for significant bilateral pulmonary emboli. We will have him begin and Xarelto starter pack today and sop his aspirin. He is in agreement with the plan and is not in any distress at this time. No questions or concerns.

## 2020-04-20 NOTE — Addendum Note (Signed)
Addended by: Melton Krebs on: 04/20/2020 11:03 AM   Modules accepted: Orders, SmartSet

## 2020-04-20 NOTE — Patient Instructions (Signed)

## 2020-04-20 NOTE — Progress Notes (Signed)
Hematology and Oncology Follow Up Visit  Melvin [name] [id number] [birth year] 59 y.o. 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  Unfortunately, he is not feeling all that well.  We saw him a couple weeks ago, he is feeling a whole lot better.  I really thought that he was responding.  We got back his CA 19-9, I was very excited.  His CA 19-9 went from 109,000 down to 10,000.  I really thought that this was a good indicator for [place].  However, he is having more breathing difficulties.  He gets weak and fatigued quite easily.  He has had some chest wall pain.  Is had some abdominal discomfort.  He is having some dysphagia.  He has had a little bit of diarrhea.  He certainly is behaving as if the cancer is progressing.  I am quite worried because his pancreatic cancer is K-ras positive.  This is quite troublesome as this might indicate that there could be some resistance to treatment.  I really think that we will going to have to get another CT angiogram of his chest.  I still worry about pulmonary emboli.  He did have a CT angiogram of the chest back in March.  This did not show any pulmonary emboli.  This, however, did show the metastatic cancer been incredibly extensive in his lungs.  He has had no hemoptysis.  His weight is holding steady.  He is not have any problems swallowing food.  It is just some of his pills that he takes he has a hard time swallowing.  He has had no fever.  He has had no leg swelling.  He has had no rashes.  Overall, I would say his performance status is ECOG 1.    Medications:  Current Outpatient Medications:  .  aspirin EC 81 MG tablet, Take 81 mg by mouth daily., Disp: , Rfl:  .  atorvastatin (LIPITOR) 10 MG tablet, Take 1 tablet (10 mg total) by mouth daily., Disp:  90 tablet, Rfl: 1 .  dexamethasone (DECADRON) 4 MG tablet, Take 2 tablets (8 mg total) by mouth daily. . Start the day after chemotherapy for 3 days.  Take with food. (Patient not taking: Reported on 03/23/2020), Disp: 8 tablet, Rfl: 5 .  diazepam (VALIUM) 5 MG tablet, Take 0.5-1 tablets (2.5-5 mg total) by mouth every 8 (eight) hours as needed for anxiety. (Patient not taking: Reported on 03/23/2020), Disp: 30 tablet, Rfl: 0 .  dronabinol (MARINOL) 5 MG capsule, Take 1 capsule (5 mg total) by mouth 2 (two) times daily before lunch and supper., Disp: 60 capsule, Rfl: 0 .  gabapentin (NEURONTIN) 300 MG capsule, Take 1 capsule (300 mg total) by mouth 3 (three) times daily., Disp: 90 capsule, Rfl: 1 .  HYDROcodone-acetaminophen (NORCO/VICODIN) 5-325 MG tablet, Take 1-2 tablets by mouth every 6 (six) hours as needed for moderate pain or severe pain., Disp: 120 tablet, Rfl: 0 .  HYDROcodone-homatropine (HYCODAN) 5-1.5 MG/5ML syrup, Take 5 mLs by mouth every 6 (six) hours as needed for cough., Disp: 240 mL, Rfl: 0 .  insulin aspart (NOVOLOG FLEXPEN) 100 UNIT/ML FlexPen, Inject 15 Units into the skin 3 (three) times daily with meals., Disp: 15 mL, Rfl: 11 .  insulin glargine, 2 Unit Dial, (TOUJEO MAX SOLOSTAR)  300 UNIT/ML Solostar Pen, Inject 25-100 Units into the skin daily. Increase by 5 units at a time, twice per week, to fasting blood sugar 100-150, Disp: 1 pen, Rfl: 0 .  Insulin Pen Needle (PEN NEEDLES) 30G X 8 MM MISC, 1 each by Does not apply route as directed., Disp: 200 each, Rfl: 99 .  lidocaine-prilocaine (EMLA) cream, Apply 1 application topically as needed. Use on portacath as directed approx 1-2 hours prior to chemotherapy, Disp: 30 g, Rfl: 0 .  loperamide (IMODIUM A-D) 2 MG capsule, Take 1 capsule (2 mg total) by mouth as needed for diarrhea or loose stools. Take 2 tablets at onset of diarrhea, then 1 every 2 hours until 12 hours without a BM., Disp: 100 capsule, Rfl: 1 .  LORazepam (ATIVAN) 0.5 MG  tablet, Take 1 tablet (0.5 mg total) by mouth every 8 (eight) hours. As needed for nausea, Disp: 30 tablet, Rfl: 0 .  losartan (COZAAR) 25 MG tablet, Take 1 tablet (25 mg total) by mouth daily., Disp: 90 tablet, Rfl: 1 .  metFORMIN (GLUCOPHAGE) 1000 MG tablet, Take 1 tablet (1,000 mg total) by mouth 2 (two) times daily with a meal., Disp: 180 tablet, Rfl: 1 .  metoCLOPramide (REGLAN) 10 MG tablet, Take 0.5 tablets (5 mg total) by mouth as needed for nausea (nausea/reflux)., Disp: 30 tablet, Rfl: 3 .  ondansetron (ZOFRAN) 8 MG tablet, Take 1 tablet (8 mg total) by mouth 2 (two) times daily. As needed.  Start on Day 3 after chemotherapy., Disp: 30 tablet, Rfl: 1 .  ondansetron (ZOFRAN-ODT) 8 MG disintegrating tablet, Take 1 tablet (8 mg total) by mouth every 8 (eight) hours as needed for nausea., Disp: 40 tablet, Rfl: 3 .  Pancrelipase, Lip-Prot-Amyl, (CREON) 24000-76000 units CPEP, Take 3 capsules (72,000 Units total) by mouth in the morning, at noon, and at bedtime., Disp: 90 capsule, Rfl: 1 .  prochlorperazine (COMPAZINE) 10 MG tablet, Take 1 tablet (10 mg total) by mouth every 6 (six) hours as needed for nausea or vomiting., Disp: 30 tablet, Rfl: 0 No current facility-administered medications for this visit.  Facility-Administered Medications Ordered in Other Visits:  .  LORazepam (ATIVAN) tablet 0.5 mg, 0.5 mg, Oral, Once, [place], Holli Humbles, NP  Allergies: No Known Allergies  Past Medical History, Surgical history, Social history, and Family History were reviewed and updated.  Review of Systems: Review of Systems  Constitutional: Positive for unexpected weight change.  HENT:  Negative.   Eyes: Negative.   Respiratory: Negative.   Cardiovascular: Negative.   Gastrointestinal: Positive for vomiting.  Endocrine: Negative.   Genitourinary: Negative.    Musculoskeletal: Negative.   Skin: Negative.   Neurological: Negative.   Hematological: Negative.   Psychiatric/Behavioral: Negative.      Physical Exam:  weight is 223 lb 8 oz (101.4 kg). His temporal temperature is 97.1 F (36.2 C) (abnormal). His blood pressure is 147/92 (abnormal) and his pulse is 114 (abnormal). His respiration is 19 and oxygen saturation is 99%.   Wt Readings from Last 3 Encounters:  04/20/20 223 lb 8 oz (101.4 kg)  04/06/20 226 lb (102.5 kg)  03/19/20 235 lb 0.2 oz (106.6 kg)    Physical Exam Vitals reviewed.  HENT:     Head: Normocephalic and atraumatic.  Eyes:     Pupils: Pupils are equal, round, and reactive to light.  Cardiovascular:     Rate and Rhythm: Normal rate and regular rhythm.     Heart sounds: Normal heart sounds.  Pulmonary:  Effort: Pulmonary effort is normal.     Breath sounds: Normal breath sounds.  Abdominal:     General: Bowel sounds are normal.     Palpations: Abdomen is soft.  Musculoskeletal:        General: No tenderness or deformity. Normal range of motion.     Cervical back: Normal range of motion.  Lymphadenopathy:     Cervical: No cervical adenopathy.  Skin:    General: Skin is warm and dry.     Findings: No erythema or rash.  Neurological:     Mental Status: He is alert and oriented to person, place, and time.  Psychiatric:        Behavior: Behavior normal.        Thought Content: Thought content normal.        Judgment: Judgment normal.      Lab Results  Component Value Date   WBC 3.9 (L) 04/20/2020   HGB 11.2 (L) 04/20/2020   HCT 35.0 (L) 04/20/2020   MCV 93.3 04/20/2020   PLT 188 04/20/2020     Chemistry      Component Value Date/Time   NA 138 04/20/2020 0932   K 3.6 04/20/2020 0932   CL 104 04/20/2020 0932   CO2 23 04/20/2020 0932   BUN 17 04/20/2020 0932   CREATININE 0.68 04/20/2020 0932   CREATININE 0.75 03/08/2020 1058      Component Value Date/Time   CALCIUM 8.7 (L) 04/20/2020 0932   ALKPHOS 1,095 (H) 04/20/2020 0932   AST 62 (H) 04/20/2020 0932   ALT 97 (H) 04/20/2020 0932   BILITOT 1.3 (H) 04/20/2020 0932       Impression and Plan: Melvin [name] is a 59 year old white male.  He has metastatic adenocarcinoma the pancreas.  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  We did send off molecular markers.  There really is not much for [place] to go on.  But I do worry about that he does have the K-ras mutation.  This may indicate that he may have less of response to chemotherapy.  He did talk to me about treatment.  He really does not want to have any further FOLFIRINOX.  He really does not like the pump.  He would really prefer to have another regimen.  I suspect that we probably will have to make a change in his regimens anyway.  We can try him on Gemzar/Abraxane.  This is a reasonable protocol to try.  We will not treat him today.  I think will help just to give him another week off.  We will have to see with the CT angiogram of the chest shows.  If we do have any thromboembolic disease in the chest, we will have to get him on anticoagulation.  Again I am just surprised that he feels poorly given that the CA 19-9 came down so much.  By his labs, his blood sugar is quite high.  I noted that his liver function studies are also a little bit higher.  This is also troublesome.  We will have to make a change with his protocol.  I will plan to try to get him back in a week.    Volanda Napoleon, MD 5/4/202110:28 AM   ADDENDUM: Not surprisingly, the CT angiogram shows that he has significant pulmonary emboli.  We will go ahead and get him on Xarelto.  I think this is a reasonable option for him.  There is no obvious growth  in his malignancy.  His CA 19-9 did come back at 125,000.  I do believe that the CA 19-9 of 10,000 that was done a few weeks ago was not right.  I will make a change in treatment protocol.  We will now have him on Gemzar/Abraxane.  He does not want the 5-FU you pump.  I think that the Gemzar/Abraxane would be reasonable.  His blood sugars are certainly  high.  Hopefully with thinning of the thromboembolism in his lung, he will feel better and have more stamina.  I do not think we have to do any Dopplers of his legs.  I am sure that there will be a thrombus down there.  It would not change our anticoagulation regimen which will essentially be Xarelto as long as he is alive.  Lattie Haw, MD

## 2020-04-21 ENCOUNTER — Telehealth: Payer: Self-pay | Admitting: Hematology & Oncology

## 2020-04-21 ENCOUNTER — Telehealth: Payer: Self-pay | Admitting: *Deleted

## 2020-04-21 ENCOUNTER — Other Ambulatory Visit: Payer: Self-pay | Admitting: *Deleted

## 2020-04-21 DIAGNOSIS — I2602 Saddle embolus of pulmonary artery with acute cor pulmonale: Secondary | ICD-10-CM

## 2020-04-21 LAB — CANCER ANTIGEN 19-9: CA 19-9: 129166 U/mL — ABNORMAL HIGH (ref 0–35)

## 2020-04-21 MED ORDER — RIVAROXABAN (XARELTO) VTE STARTER PACK (15 & 20 MG): ORAL_TABLET | ORAL | 0 refills | Status: DC

## 2020-04-21 MED FILL — XARELTO STARTER PACK: 15 & 20 | 28 days supply | Qty: 51 | Fill #0

## 2020-04-21 NOTE — Telephone Encounter (Signed)
Received a call from patient stating that his Xarelto prescribed yesterday was 900 dollars at [facility] in [place]. Rx sent downstairs to [facility] to utilize grant money and possible copay card.  Appts made for next week to start new regimen of Abraxane/Gemzar

## 2020-04-21 NOTE — Progress Notes (Signed)
DISCONTINUE ON PATHWAY REGIMEN - Pancreatic Adenocarcinoma     A cycle is every 14 days:     Oxaliplatin      Leucovorin      Irinotecan      Fluorouracil   **Always confirm dose/schedule in your pharmacy ordering system**  REASON: Toxicities / Adverse Event PRIOR TREATMENT: PANOS98: mFOLFIRINOX q14 Days Until Progression or Toxicity TREATMENT RESPONSE: Stable Disease (SD)  START ON PATHWAY REGIMEN - Pancreatic Adenocarcinoma     A cycle is every 28 days:     Nab-paclitaxel (protein bound)      Gemcitabine   **Always confirm dose/schedule in your pharmacy ordering system**  Patient Characteristics: Metastatic Disease, Second Line, MSS/pMMR or MSI Unknown, Fluoropyrimidine-Based Therapy First Line Therapeutic Status: Metastatic Disease Line of Therapy: Second Line Microsatellite/Mismatch Repair Status: MSS/pMMR Intent of Therapy: Non-Curative / Palliative Intent, Discussed with Patient

## 2020-04-21 NOTE — Progress Notes (Unsigned)
Pharmacist Chemotherapy Monitoring - Follow Up Assessment    I verify that I have reviewed each item in the below checklist:  . Regimen for the patient is scheduled for the appropriate day and plan matches scheduled date. Marland Kitchen Appropriate non-routine labs are ordered dependent on drug ordered. . If applicable, additional medications reviewed and ordered per protocol based on lifetime cumulative doses and/or treatment regimen.   Plan for follow-up and/or issues identified: No . I-vent associated with next due treatment: No . MD and/or nursing notified: No  Melvin [name], Melvin [name] 04/21/2020 10:53 AM

## 2020-04-21 NOTE — Telephone Encounter (Signed)
No los 5/4

## 2020-04-22 ENCOUNTER — Inpatient Hospital Stay: Payer: PRIVATE HEALTH INSURANCE

## 2020-04-26 ENCOUNTER — Telehealth: Payer: Self-pay | Admitting: Hematology & Oncology

## 2020-04-26 NOTE — Telephone Encounter (Signed)
Faxed medical records to: [facility] F: [phone] Melvin [name] Aug 15, 1961 CASE: [id number]

## 2020-04-27 ENCOUNTER — Telehealth: Payer: Self-pay | Admitting: Hematology & Oncology

## 2020-04-27 NOTE — Telephone Encounter (Signed)
I called and left message for patient regarding appointment date/time changes due to Abraxane shipment delay.  Appointments were moved from 5/12 to 5/14 per 5/11 secure chat from Pharmacy

## 2020-04-27 NOTE — Progress Notes (Unsigned)
Pharmacist Chemotherapy Monitoring - Follow Up Assessment    I verify that I have reviewed each item in the below checklist:  . Regimen for the patient is scheduled for the appropriate day and plan matches scheduled date. Marland Kitchen Appropriate non-routine labs are ordered dependent on drug ordered. . If applicable, additional medications reviewed and ordered per protocol based on lifetime cumulative doses and/or treatment regimen.   Plan for follow-up and/or issues identified: No . I-vent associated with next due treatment: No . MD and/or nursing notified: No  Kerisha Goughnour, [name] 04/27/2020 7:56 AM

## 2020-04-27 NOTE — Progress Notes (Signed)
Pharmacist Chemotherapy Monitoring - Follow Up Assessment    I verify that I have reviewed each item in the below checklist:  . Regimen for the patient is scheduled for the appropriate day and plan matches scheduled date. Marland Kitchen Appropriate non-routine labs are ordered dependent on drug ordered. . If applicable, additional medications reviewed and ordered per protocol based on lifetime cumulative doses and/or treatment regimen.   Plan for follow-up and/or issues identified: No . I-vent associated with next due treatment: No . MD and/or nursing notified: No  Melvin [name], Melvin [name] 04/27/2020 3:13 PM   NOTE:  Initial assessment done for 04/28/20 start date.

## 2020-04-28 ENCOUNTER — Other Ambulatory Visit: Payer: PRIVATE HEALTH INSURANCE

## 2020-04-28 ENCOUNTER — Ambulatory Visit: Payer: PRIVATE HEALTH INSURANCE | Admitting: Hematology & Oncology

## 2020-04-28 ENCOUNTER — Ambulatory Visit: Payer: PRIVATE HEALTH INSURANCE

## 2020-04-29 ENCOUNTER — Other Ambulatory Visit: Payer: Self-pay | Admitting: *Deleted

## 2020-04-29 ENCOUNTER — Encounter: Payer: Self-pay | Admitting: Pharmacy Technician

## 2020-04-29 DIAGNOSIS — C787 Secondary malignant neoplasm of liver and intrahepatic bile duct: Secondary | ICD-10-CM

## 2020-04-29 DIAGNOSIS — I2602 Saddle embolus of pulmonary artery with acute cor pulmonale: Secondary | ICD-10-CM

## 2020-04-29 NOTE — Progress Notes (Signed)
Patient has been approved for drug assistance by [employer] for Abraxane. The enrollment period is from 04/26/20-10/23/20 based on self pay. First DOS covered is 04/30/20.

## 2020-04-30 ENCOUNTER — Inpatient Hospital Stay: Payer: PRIVATE HEALTH INSURANCE

## 2020-04-30 ENCOUNTER — Encounter: Payer: Self-pay | Admitting: Hematology & Oncology

## 2020-04-30 ENCOUNTER — Encounter: Payer: Self-pay | Admitting: *Deleted

## 2020-04-30 ENCOUNTER — Inpatient Hospital Stay (HOSPITAL_BASED_OUTPATIENT_CLINIC_OR_DEPARTMENT_OTHER): Payer: PRIVATE HEALTH INSURANCE | Admitting: Hematology & Oncology

## 2020-04-30 ENCOUNTER — Other Ambulatory Visit: Payer: Self-pay

## 2020-04-30 VITALS — BP 138/88 | HR 98 | Temp 97.0°F | Resp 18

## 2020-04-30 DIAGNOSIS — C779 Secondary and unspecified malignant neoplasm of lymph node, unspecified: Secondary | ICD-10-CM | POA: Diagnosis not present

## 2020-04-30 DIAGNOSIS — C787 Secondary malignant neoplasm of liver and intrahepatic bile duct: Secondary | ICD-10-CM | POA: Diagnosis not present

## 2020-04-30 DIAGNOSIS — C7800 Secondary malignant neoplasm of unspecified lung: Secondary | ICD-10-CM | POA: Diagnosis not present

## 2020-04-30 DIAGNOSIS — C799 Secondary malignant neoplasm of unspecified site: Secondary | ICD-10-CM | POA: Diagnosis not present

## 2020-04-30 DIAGNOSIS — Z5111 Encounter for antineoplastic chemotherapy: Secondary | ICD-10-CM | POA: Diagnosis present

## 2020-04-30 DIAGNOSIS — C259 Malignant neoplasm of pancreas, unspecified: Secondary | ICD-10-CM

## 2020-04-30 DIAGNOSIS — I2602 Saddle embolus of pulmonary artery with acute cor pulmonale: Secondary | ICD-10-CM

## 2020-04-30 LAB — CBC WITH DIFFERENTIAL (CANCER CENTER ONLY)
Abs Immature Granulocytes: 0.05 10*3/uL (ref 0.00–0.07)
Basophils Absolute: 0.1 10*3/uL (ref 0.0–0.1)
Basophils Relative: 1 %
Eosinophils Absolute: 0.1 10*3/uL (ref 0.0–0.5)
Eosinophils Relative: 2 %
HCT: 37.6 % — ABNORMAL LOW (ref 39.0–52.0)
Hemoglobin: 12 g/dL — ABNORMAL LOW (ref 13.0–17.0)
Immature Granulocytes: 1 %
Lymphocytes Relative: 15 %
Lymphs Abs: 1.2 10*3/uL (ref 0.7–4.0)
MCH: 29.7 pg (ref 26.0–34.0)
MCHC: 31.9 g/dL (ref 30.0–36.0)
MCV: 93.1 fL (ref 80.0–100.0)
Monocytes Absolute: 0.9 10*3/uL (ref 0.1–1.0)
Monocytes Relative: 11 %
Neutro Abs: 5.9 10*3/uL (ref 1.7–7.7)
Neutrophils Relative %: 70 %
Platelet Count: 280 10*3/uL (ref 150–400)
RBC: 4.04 MIL/uL — ABNORMAL LOW (ref 4.22–5.81)
RDW: 17.1 % — ABNORMAL HIGH (ref 11.5–15.5)
WBC Count: 8.2 10*3/uL (ref 4.0–10.5)
nRBC: 0 % (ref 0.0–0.2)

## 2020-04-30 LAB — CMP (CANCER CENTER ONLY)
ALT: 83 U/L — ABNORMAL HIGH (ref 0–44)
AST: 79 U/L — ABNORMAL HIGH (ref 15–41)
Albumin: 3.2 g/dL — ABNORMAL LOW (ref 3.5–5.0)
Alkaline Phosphatase: 1041 U/L — ABNORMAL HIGH (ref 38–126)
Anion gap: 10 (ref 5–15)
BUN: 8 mg/dL (ref 6–20)
CO2: 29 mmol/L (ref 22–32)
Calcium: 8.7 mg/dL — ABNORMAL LOW (ref 8.9–10.3)
Chloride: 100 mmol/L (ref 98–111)
Creatinine: 0.62 mg/dL (ref 0.61–1.24)
GFR, Est AFR Am: >60 mL/min (ref >60)
GFR, Estimated: >60 mL/min (ref >60)
Glucose, Bld: 229 mg/dL — ABNORMAL HIGH (ref 70–99)
Potassium: 3.5 mmol/L (ref 3.5–5.1)
Sodium: 139 mmol/L (ref 135–145)
Total Bilirubin: 1.6 mg/dL — ABNORMAL HIGH (ref 0.3–1.2)
Total Protein: 6 g/dL — ABNORMAL LOW (ref 6.5–8.1)

## 2020-04-30 MED ORDER — LORAZEPAM 2 MG/ML IJ SOLN
INTRAMUSCULAR | Status: AC
  Filled 2020-04-30: qty 1

## 2020-04-30 MED ORDER — PROCHLORPERAZINE MALEATE 10 MG PO TABS
ORAL_TABLET | ORAL | Status: AC
  Filled 2020-04-30: qty 1

## 2020-04-30 MED ORDER — SODIUM CHLORIDE 0.9% FLUSH
10.0000 mL | INTRAVENOUS | Status: DC | PRN
  Administered 2020-04-30: 10 mL
  Filled 2020-04-30: qty 10

## 2020-04-30 MED ORDER — SODIUM CHLORIDE 0.9 % IV SOLN
800.0000 mg/m2 | Freq: Once | INTRAVENOUS | Status: AC
  Administered 2020-04-30: 1862 mg via INTRAVENOUS
  Filled 2020-04-30: qty 48.97

## 2020-04-30 MED ORDER — LORAZEPAM 2 MG/ML IJ SOLN
1.0000 mg | Freq: Once | INTRAMUSCULAR | Status: AC
  Administered 2020-04-30: 1 mg via INTRAVENOUS

## 2020-04-30 MED ORDER — PACLITAXEL PROTEIN-BOUND CHEMO INJECTION 100 MG
100.0000 mg/m2 | Freq: Once | INTRAVENOUS | Status: AC
  Administered 2020-04-30: 225 mg via INTRAVENOUS
  Filled 2020-04-30: qty 45

## 2020-04-30 MED ORDER — SODIUM CHLORIDE 0.9 % IV SOLN
Freq: Once | INTRAVENOUS | Status: AC
  Filled 2020-04-30: qty 250

## 2020-04-30 MED ORDER — HEPARIN SOD (PORK) LOCK FLUSH 100 UNIT/ML IV SOLN
500.0000 [IU] | Freq: Once | INTRAVENOUS | Status: AC | PRN
  Administered 2020-04-30: 500 [IU]
  Filled 2020-04-30: qty 5

## 2020-04-30 MED ORDER — PROCHLORPERAZINE MALEATE 10 MG PO TABS
10.0000 mg | ORAL_TABLET | Freq: Once | ORAL | Status: AC
  Administered 2020-04-30: 10 mg via ORAL

## 2020-04-30 MED ORDER — HYDROCODONE-ACETAMINOPHEN 5-325 MG PO TABS: 1.0000 | ORAL_TABLET | Freq: Four times a day (QID) | ORAL | 0 refills | Status: DC | PRN

## 2020-04-30 MED ORDER — DRONABINOL 5 MG PO CAPS: 5.0000 mg | ORAL_CAPSULE | Freq: Two times a day (BID) | ORAL | 0 refills | Status: DC

## 2020-04-30 NOTE — Patient Instructions (Signed)
[facility] Discharge Instructions for Patients Receiving Chemotherapy  Today you received the following chemotherapy agents: Abraxane, Gemzar  To help prevent nausea and vomiting after your treatment, we encourage you to take your nausea medication    If you develop nausea and vomiting that is not controlled by your nausea medication, call the clinic.   BELOW ARE SYMPTOMS THAT SHOULD BE REPORTED IMMEDIATELY:  *FEVER GREATER THAN 100.5 F  *CHILLS WITH OR WITHOUT FEVER  NAUSEA AND VOMITING THAT IS NOT CONTROLLED WITH YOUR NAUSEA MEDICATION  *UNUSUAL SHORTNESS OF BREATH  *UNUSUAL BRUISING OR BLEEDING  TENDERNESS IN MOUTH AND THROAT WITH OR WITHOUT PRESENCE OF ULCERS  *URINARY PROBLEMS  *BOWEL PROBLEMS  UNUSUAL RASH Items with * indicate a potential emergency and should be followed up as soon as possible.  Feel free to call the clinic should you have any questions or concerns. The clinic phone number is [phone].  Please show the CHEMO ALERT CARD at check-in to the Emergency Department and triage nurse.   

## 2020-04-30 NOTE — Progress Notes (Signed)
When reviewing patient's appointments, it was clear that they were incorrect. Message sent to scheduler to fix future appointments.  Spoke with patient in treatment. He is doing well. Some discomfort. Made sure he knew that his future appointments were incorrect, and to make sure he received a new calendar before leaving today. He acknowledged instruction.

## 2020-04-30 NOTE — Progress Notes (Signed)
New regiment starting today.  Labwork reviewed with Dr. Marin Olp.  Ok to treat

## 2020-04-30 NOTE — Patient Instructions (Signed)

## 2020-04-30 NOTE — Progress Notes (Signed)
Hematology and Oncology Follow Up Visit  Melvin [name] [id number] [birth year] 59 y.o. 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  When he was here last week, we did a CT angiogram on him.  This unfortunately showed he had a significant pulmonary embolism.  As such, we had to start him on anticoagulation with Xarelto.  I thought this was reasonable.  He did have a CT angiogram which showed that his cancer was essentially stable.  His last CA 19-9 was up at 130,000.  I am little surprised by this.  By the CT scan, there was no obvious progression of his cancer.  He still has some substernal chest discomfort.  He says his breathing is doing a little bit better.  I suspect that he probably has some lung injury from the embolism.  He is eating better.  The Marinol seems to be helping this.  He is not having diarrhea.  He is on Creon.  I told him to decrease the Creon to 1 p.o. twice daily.  He has had no bleeding.  There is been no fever.  He has had no leg swelling.  He has had no rashes.  We are changing his chemotherapy protocol.  He really did not like the 5-FU pump.  As such, we are trying him on Gemzar/Abraxane.  I think this is very reasonable.  Overall, I would say his performance status is ECOG 1.  I did get an EKG on him today.  He does have some sinus tachycardia with PACs.    Medications:  Current Outpatient Medications:  .  atorvastatin (LIPITOR) 10 MG tablet, Take 1 tablet (10 mg total) by mouth daily., Disp: 90 tablet, Rfl: 1 .  dexamethasone (DECADRON) 4 MG tablet, Take 2 tablets (8 mg total) by mouth daily. . Start the  day after chemotherapy for 3 days.  Take with food. (Patient not taking: Reported on 03/23/2020), Disp: 8 tablet, Rfl: 5 .  diazepam (VALIUM) 5 MG tablet, Take 0.5-1 tablets (2.5-5 mg total) by mouth every 8 (eight) hours as needed for anxiety. (Patient not taking: Reported on 03/23/2020), Disp: 30 tablet, Rfl: 0 .  dronabinol (MARINOL) 5 MG capsule, Take 1 capsule (5 mg total) by mouth 2 (two) times daily before lunch and supper., Disp: 60 capsule, Rfl: 0 .  gabapentin (NEURONTIN) 300 MG capsule, Take 1 capsule (300 mg total) by mouth 3 (three) times daily., Disp: 90 capsule, Rfl: 1 .  HYDROcodone-acetaminophen (NORCO/VICODIN) 5-325 MG tablet, Take 1-2 tablets by mouth every 6 (six) hours as needed for moderate pain or severe pain., Disp: 120 tablet, Rfl: 0 .  HYDROcodone-homatropine (HYCODAN) 5-1.5 MG/5ML syrup, Take 5 mLs by mouth every 6 (six) hours as needed for cough., Disp: 240 mL, Rfl: 0 .  insulin aspart (NOVOLOG FLEXPEN) 100 UNIT/ML FlexPen, Inject 15 Units into the skin 3 (three) times daily with meals., Disp: 15 mL, Rfl: 11 .  insulin glargine, 2 Unit Dial, (TOUJEO MAX SOLOSTAR) 300 UNIT/ML Solostar Pen, Inject 25-100 Units into the skin daily. Increase by 5 units at a time, twice per week, to fasting blood  sugar 100-150, Disp: 1 pen, Rfl: 0 .  Insulin Pen Needle (PEN NEEDLES) 30G X 8 MM MISC, 1 each by Does not apply route as directed., Disp: 200 each, Rfl: 99 .  lidocaine-prilocaine (EMLA) cream, Apply 1 application topically as needed. Use on portacath as directed approx 1-2 hours prior to chemotherapy, Disp: 30 g, Rfl: 0 .  loperamide (IMODIUM A-D) 2 MG capsule, Take 1 capsule (2 mg total) by mouth as needed for diarrhea or loose stools. Take 2 tablets at onset of diarrhea, then 1 every 2 hours until 12 hours without a BM., Disp: 100 capsule, Rfl: 1 .  LORazepam (ATIVAN) 0.5 MG tablet, Take 1 tablet (0.5 mg total) by mouth every 8 (eight) hours. As needed for nausea, Disp: 30 tablet, Rfl: 0 .   losartan (COZAAR) 25 MG tablet, Take 1 tablet (25 mg total) by mouth daily., Disp: 90 tablet, Rfl: 1 .  metFORMIN (GLUCOPHAGE) 1000 MG tablet, Take 1 tablet (1,000 mg total) by mouth 2 (two) times daily with a meal., Disp: 180 tablet, Rfl: 1 .  metoCLOPramide (REGLAN) 10 MG tablet, Take 0.5 tablets (5 mg total) by mouth as needed for nausea (nausea/reflux)., Disp: 30 tablet, Rfl: 3 .  ondansetron (ZOFRAN) 8 MG tablet, Take 1 tablet (8 mg total) by mouth 2 (two) times daily. As needed.  Start on Day 3 after chemotherapy., Disp: 30 tablet, Rfl: 1 .  ondansetron (ZOFRAN-ODT) 8 MG disintegrating tablet, Take 1 tablet (8 mg total) by mouth every 8 (eight) hours as needed for nausea., Disp: 40 tablet, Rfl: 3 .  Pancrelipase, Lip-Prot-Amyl, (CREON) 24000-76000 units CPEP, Take 3 capsules (72,000 Units total) by mouth in the morning, at noon, and at bedtime., Disp: 90 capsule, Rfl: 1 .  prochlorperazine (COMPAZINE) 10 MG tablet, Take 1 tablet (10 mg total) by mouth every 6 (six) hours as needed for nausea or vomiting., Disp: 30 tablet, Rfl: 0 .  RIVAROXABAN (XARELTO) VTE STARTER PACK (15 & 20 MG TABLETS), Follow package directions: Take one '15mg'$  tablet by mouth twice a day. On day 22, switch to one '20mg'$  tablet once a day. Take with food., Disp: 51 each, Rfl: 0 No current facility-administered medications for this visit.  Facility-Administered Medications Ordered in Other Visits:  .  0.9 %  sodium chloride infusion, , Intravenous, Once, Adiva Boettner, Rudell Cobb, MD .  gemcitabine (GEMZAR) 1,862 mg in sodium chloride 0.9 % 250 mL chemo infusion, 800 mg/m2 (Treatment Plan Recorded), Intravenous, Once, Jodye Scali, Rudell Cobb, MD .  heparin lock flush 100 unit/mL, 500 Units, Intracatheter, Once PRN, Marin Olp, Rudell Cobb, MD .  LORazepam (ATIVAN) tablet 0.5 mg, 0.5 mg, Oral, Once, [place], Sarah M, NP .  PACLitaxel-protein bound (ABRAXANE) chemo infusion 225 mg, 100 mg/m2 (Treatment Plan Recorded), Intravenous, Once, Berdena Cisek,  Rudell Cobb, MD .  prochlorperazine (COMPAZINE) tablet 10 mg, 10 mg, Oral, Once, Kyan Yurkovich, Rudell Cobb, MD .  sodium chloride flush (NS) 0.9 % injection 10 mL, 10 mL, Intravenous, PRN, [place], Sarah M, NP, 10 mL at 04/20/20 1104 .  sodium chloride flush (NS) 0.9 % injection 10 mL, 10 mL, Intracatheter, PRN, Marin Olp, Rudell Cobb, MD  Allergies: No Known Allergies  Past Medical History, Surgical history, Social history, and Family History were reviewed and updated.  Review of Systems: Review of Systems  Constitutional: Positive for unexpected weight change.  HENT:  Negative.   Eyes: Negative.   Respiratory: Negative.   Cardiovascular: Negative.   Gastrointestinal: Positive for vomiting.  Endocrine: Negative.   Genitourinary:  Negative.    Musculoskeletal: Negative.   Skin: Negative.   Neurological: Negative.   Hematological: Negative.   Psychiatric/Behavioral: Negative.     Physical Exam:  vitals were not taken for this visit.   Wt Readings from Last 3 Encounters:  04/20/20 223 lb 8 oz (101.4 kg)  04/06/20 226 lb (102.5 kg)  03/19/20 235 lb 0.2 oz (106.6 kg)    Physical Exam Vitals reviewed.  HENT:     Head: Normocephalic and atraumatic.  Eyes:     Pupils: Pupils are equal, round, and reactive to light.  Cardiovascular:     Rate and Rhythm: Normal rate and regular rhythm.     Heart sounds: Normal heart sounds.  Pulmonary:     Effort: Pulmonary effort is normal.     Breath sounds: Normal breath sounds.  Abdominal:     General: Bowel sounds are normal.     Palpations: Abdomen is soft.  Musculoskeletal:        General: No tenderness or deformity. Normal range of motion.     Cervical back: Normal range of motion.  Lymphadenopathy:     Cervical: No cervical adenopathy.  Skin:    General: Skin is warm and dry.     Findings: No erythema or rash.  Neurological:     Mental Status: He is alert and oriented to person, place, and time.  Psychiatric:        Behavior: Behavior  normal.        Thought Content: Thought content normal.        Judgment: Judgment normal.      Lab Results  Component Value Date   WBC 8.2 04/30/2020   HGB 12.0 (L) 04/30/2020   HCT 37.6 (L) 04/30/2020   MCV 93.1 04/30/2020   PLT 280 04/30/2020     Chemistry      Component Value Date/Time   NA 139 04/30/2020 0845   K 3.5 04/30/2020 0845   CL 100 04/30/2020 0845   CO2 29 04/30/2020 0845   BUN 8 04/30/2020 0845   CREATININE 0.62 04/30/2020 0845   CREATININE 0.75 03/08/2020 1058      Component Value Date/Time   CALCIUM 8.7 (L) 04/30/2020 0845   ALKPHOS 1,041 (H) 04/30/2020 0845   AST 79 (H) 04/30/2020 0845   ALT 83 (H) 04/30/2020 0845   BILITOT 1.6 (H) 04/30/2020 0845      Impression and Plan: Melvin [name] is a 59 year old white male.  He has metastatic adenocarcinoma the pancreas.  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  We did send off molecular markers.  There really is not much for [place] to go on.  But I do worry about that he does have the K-ras mutation.  This may indicate that he may have less of response to chemotherapy.  I think we have to push ahead with chemotherapy today.  I realize that he does have some sinus tachycardia.  I was worried that he may have had atrial fibrillation.  I have to believe that the Xarelto will work for the pulmonary embolism.  His should thin this out.  Hopefully he will tolerate the Gemzar/Abraxane.  His blood sugars are always on the high side.  This really does not trouble me in all honesty.  He will get treatment this week, next week and then the following week.  He will then have off.  I will see him back next week just to see how he is feeling.  I would not check his CA 19-9 probably until the next cycle of treatment in a month.     Volanda Napoleon, MD 5/14/20219:50 AM

## 2020-05-03 ENCOUNTER — Other Ambulatory Visit: Payer: Self-pay

## 2020-05-03 NOTE — Progress Notes (Signed)
Opened in error

## 2020-05-04 ENCOUNTER — Inpatient Hospital Stay: Payer: PRIVATE HEALTH INSURANCE

## 2020-05-04 ENCOUNTER — Ambulatory Visit: Payer: PRIVATE HEALTH INSURANCE | Admitting: Hematology & Oncology

## 2020-05-05 ENCOUNTER — Other Ambulatory Visit: Payer: Self-pay | Admitting: Hematology & Oncology

## 2020-05-05 DIAGNOSIS — I2602 Saddle embolus of pulmonary artery with acute cor pulmonale: Secondary | ICD-10-CM

## 2020-05-05 MED ORDER — RIVAROXABAN 20 MG PO TABS
20.0000 mg | ORAL_TABLET | Freq: Every day | ORAL | 6 refills | Status: DC
Start: 2020-05-05 — End: 2020-06-06

## 2020-05-05 MED FILL — XARELTO 20 MG TABLET: 20 | 30 days supply | Qty: 30 | Fill #0

## 2020-05-06 ENCOUNTER — Telehealth: Payer: Self-pay | Admitting: Osteopathic Medicine

## 2020-05-06 ENCOUNTER — Ambulatory Visit (INDEPENDENT_AMBULATORY_CARE_PROVIDER_SITE_OTHER): Payer: PRIVATE HEALTH INSURANCE | Admitting: Osteopathic Medicine

## 2020-05-06 ENCOUNTER — Other Ambulatory Visit: Payer: Self-pay

## 2020-05-06 ENCOUNTER — Encounter: Payer: Self-pay | Admitting: Osteopathic Medicine

## 2020-05-06 VITALS — BP 114/77 | HR 105 | Temp 98.5°F | Wt 223.0 lb

## 2020-05-06 DIAGNOSIS — E1169 Type 2 diabetes mellitus with other specified complication: Secondary | ICD-10-CM

## 2020-05-06 DIAGNOSIS — Z794 Long term (current) use of insulin: Secondary | ICD-10-CM | POA: Diagnosis not present

## 2020-05-06 LAB — POCT GLYCOSYLATED HEMOGLOBIN (HGB A1C): Hemoglobin A1C: 8.9 % — AB (ref 4.0–5.6)

## 2020-05-06 NOTE — Telephone Encounter (Signed)
Can we reach out to the drug rep(s) to look into getting samples of Xarelto 20 mg daily for this patient? Or Eliquis 5 mg bid? Thanks! Cancer patient, limited funds.

## 2020-05-06 NOTE — Progress Notes (Signed)
Melvin [name] is a 59 y.o. male who presents to  [facility]  today, 05/06/20, seeking care for the following: . Diabetes . Cancer - pancreatic, following w/ HemOnc  Melvin [name] is doing fairly well, all things considered. SOB d/t PE, currently on Xarelto. Cost of care and Rx's a concern for him and his wife. He's staying home w/ family.      ASSESSMENT & PLAN with other pertinent history/findings:  The encounter diagnosis was Type 2 diabetes mellitus with other specified complication, with long-term current use of insulin ([place]).  Wt Readings from Last 3 Encounters:  05/06/20 223 lb (101.2 kg)  04/20/20 223 lb 8 oz (101.4 kg)  04/06/20 226 lb (102.5 kg)   I would agree w/ Dr Marin Olp that sugars are not problematic Avoiding DKA is main concern, ER precautions were reviewed See pt instructions  Will look into getting Xarelto samples May have to consider Coumadin alternative     Patient Instructions  I'll see if I can figure out some Xarelto samples, or we can use Coumadin  For the meal-time insulin (Novolog) can take 7 units with smaller meals, 10 units w/ larger meals, NO insulin with small snacks.   I know you have enough doctor visit to worry about, so I won't drag you in here just to say hello (though feel free to come say hi anytime!). Let's plan to recheck the A1C & sugars in 3-4 months, and you can always come see me sooner if needed.   Take care! Take it easy!     Orders Placed This Encounter  Procedures  . POCT HgB A1C    No orders of the defined types were placed in this encounter.      Follow-up instructions: Return in about 3 months (around 08/06/2020) for recheck A1C / diabetes. See me sooner if needed .                                         BP 114/77 (BP Location: Left Arm, Patient Position: Sitting, Cuff Size: Normal)   Pulse (!) 105   Temp 98.5 F (36.9  C) (Oral)   Wt 223 lb (101.2 kg)   BMI 27.51 kg/m   Current Meds  Medication Sig  . atorvastatin (LIPITOR) 10 MG tablet Take 1 tablet (10 mg total) by mouth daily.  Marland Kitchen dronabinol (MARINOL) 5 MG capsule Take 1 capsule (5 mg total) by mouth 2 (two) times daily before lunch and supper.  . gabapentin (NEURONTIN) 300 MG capsule Take 1 capsule (300 mg total) by mouth 3 (three) times daily.  Marland Kitchen HYDROcodone-acetaminophen (NORCO/VICODIN) 5-325 MG tablet Take 1-2 tablets by mouth every 6 (six) hours as needed for moderate pain or severe pain.  Marland Kitchen HYDROcodone-homatropine (HYCODAN) 5-1.5 MG/5ML syrup Take 5 mLs by mouth every 6 (six) hours as needed for cough.  . insulin aspart (NOVOLOG FLEXPEN) 100 UNIT/ML FlexPen Inject 15 Units into the skin 3 (three) times daily with meals.  . Insulin Pen Needle (PEN NEEDLES) 30G X 8 MM MISC 1 each by Does not apply route as directed.  . lidocaine-prilocaine (EMLA) cream Apply 1 application topically as needed. Use on portacath as directed approx 1-2 hours prior to chemotherapy  . loperamide (IMODIUM A-D) 2 MG capsule Take 1 capsule (2 mg total) by mouth as needed for diarrhea or loose stools.  Take 2 tablets at onset of diarrhea, then 1 every 2 hours until 12 hours without a BM.  Marland Kitchen LORazepam (ATIVAN) 0.5 MG tablet Take 1 tablet (0.5 mg total) by mouth every 8 (eight) hours. As needed for nausea  . losartan (COZAAR) 25 MG tablet Take 1 tablet (25 mg total) by mouth daily.  . metFORMIN (GLUCOPHAGE) 1000 MG tablet Take 1 tablet (1,000 mg total) by mouth 2 (two) times daily with a meal.  . metoCLOPramide (REGLAN) 10 MG tablet Take 0.5 tablets (5 mg total) by mouth as needed for nausea (nausea/reflux).  . ondansetron (ZOFRAN) 8 MG tablet Take 1 tablet (8 mg total) by mouth 2 (two) times daily. As needed.  Start on Day 3 after chemotherapy.  . ondansetron (ZOFRAN-ODT) 8 MG disintegrating tablet Take 1 tablet (8 mg total) by mouth every 8 (eight) hours as needed for nausea.  .  Pancrelipase, Lip-Prot-Amyl, (CREON) 24000-76000 units CPEP Take 3 capsules (72,000 Units total) by mouth in the morning, at noon, and at bedtime.  . prochlorperazine (COMPAZINE) 10 MG tablet Take 1 tablet (10 mg total) by mouth every 6 (six) hours as needed for nausea or vomiting.  . rivaroxaban (XARELTO) 20 MG TABS tablet Take 1 tablet (20 mg total) by mouth daily with supper.  Marland Kitchen RIVAROXABAN (XARELTO) VTE STARTER PACK (15 & 20 MG TABLETS) Follow package directions: Take one 15mg  tablet by mouth twice a day. On day 22, switch to one 20mg  tablet once a day. Take with food.  . [DISCONTINUED] insulin glargine, 2 Unit Dial, (TOUJEO MAX SOLOSTAR) 300 UNIT/ML Solostar Pen Inject 25-100 Units into the skin daily. Increase by 5 units at a time, twice per week, to fasting blood sugar 100-150    Results for orders placed or performed in visit on 05/06/20 (from the past 72 hour(s))  POCT HgB A1C     Status: Abnormal   Collection Time: 05/06/20 10:25 AM  Result Value Ref Range   Hemoglobin A1C 8.9 (A) 4.0 - 5.6 %   HbA1c POC (<> result, manual entry)     HbA1c, POC (prediabetic range)     HbA1c, POC (controlled diabetic range)      No results found.  Depression screen PHQ 2/9 03/08/2020  Decreased Interest 1  Down, Depressed, Hopeless 0  PHQ - 2 Score 1    No flowsheet data found.    All questions at time of visit were answered - patient instructed to contact office with any additional concerns or updates.  ER/RTC precautions were reviewed with the patient.  Please note: voice recognition software was used to produce this document, and typos may escape review. Please contact Dr. Sheppard Coil for any needed clarifications.

## 2020-05-06 NOTE — Patient Instructions (Signed)
I'll see if I can figure out some Xarelto samples, or we can use Coumadin  For the meal-time insulin (Novolog) can take 7 units with smaller meals, 10 units w/ larger meals, NO insulin with small snacks.   I know you have enough doctor visit to worry about, so I won't drag you in here just to say hello (though feel free to come say hi anytime!). Let's plan to recheck the A1C & sugars in 3-4 months, and you can always come see me sooner if needed.   Take care! Take it easy!

## 2020-05-07 ENCOUNTER — Inpatient Hospital Stay: Payer: PRIVATE HEALTH INSURANCE

## 2020-05-07 ENCOUNTER — Ambulatory Visit (HOSPITAL_BASED_OUTPATIENT_CLINIC_OR_DEPARTMENT_OTHER)
Admission: RE | Admit: 2020-05-07 | Discharge: 2020-05-07 | Disposition: A | Discharge status: Home / Self Care | Payer: PRIVATE HEALTH INSURANCE | Source: Ambulatory Visit | Attending: Hematology & Oncology | Admitting: Hematology & Oncology

## 2020-05-07 ENCOUNTER — Encounter: Payer: Self-pay | Admitting: *Deleted

## 2020-05-07 ENCOUNTER — Inpatient Hospital Stay (HOSPITAL_BASED_OUTPATIENT_CLINIC_OR_DEPARTMENT_OTHER): Payer: PRIVATE HEALTH INSURANCE | Admitting: Hematology & Oncology

## 2020-05-07 DIAGNOSIS — C787 Secondary malignant neoplasm of liver and intrahepatic bile duct: Secondary | ICD-10-CM | POA: Diagnosis not present

## 2020-05-07 DIAGNOSIS — Z5111 Encounter for antineoplastic chemotherapy: Secondary | ICD-10-CM | POA: Diagnosis not present

## 2020-05-07 DIAGNOSIS — C259 Malignant neoplasm of pancreas, unspecified: Secondary | ICD-10-CM

## 2020-05-07 DIAGNOSIS — C7800 Secondary malignant neoplasm of unspecified lung: Secondary | ICD-10-CM

## 2020-05-07 DIAGNOSIS — C799 Secondary malignant neoplasm of unspecified site: Secondary | ICD-10-CM

## 2020-05-07 LAB — CMP (CANCER CENTER ONLY)
ALT: 109 U/L — ABNORMAL HIGH (ref 0–44)
AST: 89 U/L — ABNORMAL HIGH (ref 15–41)
Albumin: 3.1 g/dL — ABNORMAL LOW (ref 3.5–5.0)
Alkaline Phosphatase: 951 U/L — ABNORMAL HIGH (ref 38–126)
Anion gap: 13 (ref 5–15)
BUN: 14 mg/dL (ref 6–20)
CO2: 25 mmol/L (ref 22–32)
Calcium: 8.6 mg/dL — ABNORMAL LOW (ref 8.9–10.3)
Chloride: 99 mmol/L (ref 98–111)
Creatinine: 0.58 mg/dL — ABNORMAL LOW (ref 0.61–1.24)
GFR, Est AFR Am: >60 mL/min (ref >60)
GFR, Estimated: >60 mL/min (ref >60)
Glucose, Bld: 487 mg/dL — ABNORMAL HIGH (ref 70–99)
Potassium: 4 mmol/L (ref 3.5–5.1)
Sodium: 137 mmol/L (ref 135–145)
Total Bilirubin: 2 mg/dL — ABNORMAL HIGH (ref 0.3–1.2)
Total Protein: 6.3 g/dL — ABNORMAL LOW (ref 6.5–8.1)

## 2020-05-07 LAB — CBC WITH DIFFERENTIAL (CANCER CENTER ONLY)
Abs Immature Granulocytes: 0.25 10*3/uL — ABNORMAL HIGH (ref 0.00–0.07)
Basophils Absolute: 0 10*3/uL (ref 0.0–0.1)
Basophils Relative: 0 %
Eosinophils Absolute: 0 10*3/uL (ref 0.0–0.5)
Eosinophils Relative: 0 %
HCT: 36.4 % — ABNORMAL LOW (ref 39.0–52.0)
Hemoglobin: 11.6 g/dL — ABNORMAL LOW (ref 13.0–17.0)
Immature Granulocytes: 3 %
Lymphocytes Relative: 11 %
Lymphs Abs: 0.9 10*3/uL (ref 0.7–4.0)
MCH: 29.9 pg (ref 26.0–34.0)
MCHC: 31.9 g/dL (ref 30.0–36.0)
MCV: 93.8 fL (ref 80.0–100.0)
Monocytes Absolute: 0.4 10*3/uL (ref 0.1–1.0)
Monocytes Relative: 5 %
Neutro Abs: 6.3 10*3/uL (ref 1.7–7.7)
Neutrophils Relative %: 81 %
Platelet Count: 131 10*3/uL — ABNORMAL LOW (ref 150–400)
RBC: 3.88 MIL/uL — ABNORMAL LOW (ref 4.22–5.81)
RDW: 16.4 % — ABNORMAL HIGH (ref 11.5–15.5)
WBC Count: 7.9 10*3/uL (ref 4.0–10.5)
nRBC: 0 % (ref 0.0–0.2)

## 2020-05-07 LAB — SAMPLE TO BLOOD BANK

## 2020-05-07 MED ORDER — FENTANYL 12 MCG/HR TD PT72: 1.0000 | MEDICATED_PATCH | TRANSDERMAL | 0 refills | Status: DC

## 2020-05-07 MED ORDER — FLUCONAZOLE 100 MG PO TABS
100.0000 mg | ORAL_TABLET | Freq: Every day | ORAL | 5 refills | Status: DC
Start: 2020-05-07 — End: 2020-07-23

## 2020-05-07 MED ORDER — PROCHLORPERAZINE MALEATE 10 MG PO TABS
ORAL_TABLET | ORAL | Status: AC
  Filled 2020-05-07: qty 1

## 2020-05-07 MED ORDER — SODIUM CHLORIDE 0.9% FLUSH
10.0000 mL | INTRAVENOUS | Status: DC | PRN
  Administered 2020-05-07: 10 mL via INTRAVENOUS
  Filled 2020-05-07: qty 10

## 2020-05-07 MED ORDER — SODIUM CHLORIDE 0.9 % IV SOLN
Freq: Once | INTRAVENOUS | Status: AC
  Filled 2020-05-07: qty 250

## 2020-05-07 MED ORDER — LORAZEPAM 2 MG/ML IJ SOLN
1.0000 mg | Freq: Once | INTRAMUSCULAR | Status: AC
  Administered 2020-05-07: 1 mg via INTRAVENOUS

## 2020-05-07 MED ORDER — HYDROCODONE-HOMATROPINE 5-1.5 MG/5ML PO SYRP: 5.0000 mL | ORAL_SOLUTION | Freq: Four times a day (QID) | ORAL | 0 refills | Status: AC | PRN

## 2020-05-07 MED ORDER — SODIUM CHLORIDE 0.9 % IV SOLN
Freq: Once | INTRAVENOUS | Status: DC
  Filled 2020-05-07: qty 250

## 2020-05-07 MED ORDER — INSULIN REGULAR HUMAN 100 UNIT/ML IJ SOLN
20.0000 [IU] | Freq: Once | INTRAMUSCULAR | Status: AC
  Administered 2020-05-07: 20 [IU] via SUBCUTANEOUS

## 2020-05-07 MED ORDER — PROCHLORPERAZINE MALEATE 10 MG PO TABS
10.0000 mg | ORAL_TABLET | Freq: Once | ORAL | Status: AC
  Administered 2020-05-07: 10 mg via ORAL

## 2020-05-07 MED ORDER — LORAZEPAM 2 MG/ML IJ SOLN
INTRAMUSCULAR | Status: AC
  Filled 2020-05-07: qty 1

## 2020-05-07 MED ORDER — SODIUM CHLORIDE 0.9 % IV SOLN
800.0000 mg/m2 | Freq: Once | INTRAVENOUS | Status: AC
  Administered 2020-05-07: 1862 mg via INTRAVENOUS
  Filled 2020-05-07: qty 48.97

## 2020-05-07 MED ORDER — HEPARIN SOD (PORK) LOCK FLUSH 100 UNIT/ML IV SOLN
500.0000 [IU] | Freq: Once | INTRAVENOUS | Status: AC
  Administered 2020-05-07: 500 [IU] via INTRAVENOUS
  Filled 2020-05-07: qty 5

## 2020-05-07 MED ORDER — PACLITAXEL PROTEIN-BOUND CHEMO INJECTION 100 MG
100.0000 mg/m2 | Freq: Once | INTRAVENOUS | Status: AC
  Administered 2020-05-07: 225 mg via INTRAVENOUS
  Filled 2020-05-07: qty 45

## 2020-05-07 MED ORDER — FLUCONAZOLE IN SODIUM CHLORIDE 400-0.9 MG/200ML-% IV SOLN: 400.0000 mg | INTRAVENOUS | Status: DC

## 2020-05-07 MED FILL — FENTANYL 12 MCG/HR PT72: 12 | 10 days supply | Qty: 10 | Fill #0

## 2020-05-07 MED FILL — HYDROCODONE-HOMATROPINE SOL: 5-1.5 | 12 days supply | Qty: 240 | Fill #0

## 2020-05-07 MED FILL — FLUCONAZOLE 100 MG TABLET: 100 | 21 days supply | Qty: 21 | Fill #0

## 2020-05-07 NOTE — Progress Notes (Signed)
Dr. Marin Olp has evaluated this patient today and approved treatment with today's laboratory values and vitals.

## 2020-05-07 NOTE — Progress Notes (Addendum)
Pharmacist Chemotherapy Monitoring - Follow Up Assessment    I verify that I have reviewed each item in the below checklist:  . Regimen for the patient is scheduled for the appropriate day and plan matches scheduled date. Marland Kitchen Appropriate non-routine labs are ordered dependent on drug ordered. . If applicable, additional medications reviewed and ordered per protocol based on lifetime cumulative doses and/or treatment regimen.   Plan for follow-up and/or issues identified: No . I-vent associated with next due treatment: Yes . MD and/or nursing notified: No  Issa Kosmicki, [name] 05/07/2020 12:26 PM   Okay to treat with elevated LFTs per Laverna Peace, NP.  Patient has elevated enzymes secondary to hepatic mets.

## 2020-05-07 NOTE — Patient Instructions (Signed)

## 2020-05-07 NOTE — Patient Instructions (Signed)
Gemcitabine injection What is this medicine? GEMCITABINE (jem SYE ta been) is a chemotherapy drug. This medicine is used to treat many types of cancer like breast cancer, lung cancer, pancreatic cancer, and ovarian cancer. This medicine may be used for other purposes; ask your health care provider or pharmacist if you have questions. COMMON BRAND NAME(S): Gemzar, Infugem What should I tell my health care provider before I take this medicine? They need to know if you have any of these conditions:  blood disorders  infection  kidney disease  liver disease  lung or breathing disease, like asthma  recent or ongoing radiation therapy  an unusual or allergic reaction to gemcitabine, other chemotherapy, other medicines, foods, dyes, or preservatives  pregnant or trying to get pregnant  breast-feeding How should I use this medicine? This drug is given as an infusion into a vein. It is administered in a hospital or clinic by a specially trained health care professional. Talk to your pediatrician regarding the use of this medicine in children. Special care may be needed. Overdosage: If you think you have taken too much of this medicine contact a poison control center or emergency room at once. NOTE: This medicine is only for you. Do not share this medicine with others. What if I miss a dose? It is important not to miss your dose. Call your doctor or health care professional if you are unable to keep an appointment. What may interact with this medicine?  medicines to increase blood counts like filgrastim, pegfilgrastim, sargramostim  some other chemotherapy drugs like cisplatin  vaccines Talk to your doctor or health care professional before taking any of these medicines:  acetaminophen  aspirin  ibuprofen  ketoprofen  naproxen This list may not describe all possible interactions. Give your health care provider a list of all the medicines, herbs, non-prescription drugs, or  dietary supplements you use. Also tell them if you smoke, drink alcohol, or use illegal drugs. Some items may interact with your medicine. What should I watch for while using this medicine? Visit your doctor for checks on your progress. This drug may make you feel generally unwell. This is not uncommon, as chemotherapy can affect healthy cells as well as cancer cells. Report any side effects. Continue your course of treatment even though you feel ill unless your doctor tells you to stop. In some cases, you may be given additional medicines to help with side effects. Follow all directions for their use. Call your doctor or health care professional for advice if you get a fever, chills or sore throat, or other symptoms of a cold or flu. Do not treat yourself. This drug decreases your body's ability to fight infections. Try to avoid being around people who are sick. This medicine may increase your risk to bruise or bleed. Call your doctor or health care professional if you notice any unusual bleeding. Be careful brushing and flossing your teeth or using a toothpick because you may get an infection or bleed more easily. If you have any dental work done, tell your dentist you are receiving this medicine. Avoid taking products that contain aspirin, acetaminophen, ibuprofen, naproxen, or ketoprofen unless instructed by your doctor. These medicines may hide a fever. Do not become pregnant while taking this medicine or for 6 months after stopping it. Women should inform their doctor if they wish to become pregnant or think they might be pregnant. Men should not father a child while taking this medicine and for 3 months after stopping it.   There is a potential for serious side effects to an unborn child. Talk to your health care professional or pharmacist for more information. Do not breast-feed an infant while taking this medicine or for at least 1 week after stopping it. Men should inform their doctors if they wish  to father a child. This medicine may lower sperm counts. Talk with your doctor or health care professional if you are concerned about your fertility. What side effects may I notice from receiving this medicine? Side effects that you should report to your doctor or health care professional as soon as possible:  allergic reactions like skin rash, itching or hives, swelling of the face, lips, or tongue  breathing problems  pain, redness, or irritation at site where injected  signs and symptoms of a dangerous change in heartbeat or heart rhythm like chest pain; dizziness; fast or irregular heartbeat; palpitations; feeling faint or lightheaded, falls; breathing problems  signs of decreased platelets or bleeding - bruising, pinpoint red spots on the skin, black, tarry stools, blood in the urine  signs of decreased red blood cells - unusually weak or tired, feeling faint or lightheaded, falls  signs of infection - fever or chills, cough, sore throat, pain or difficulty passing urine  signs and symptoms of kidney injury like trouble passing urine or change in the amount of urine  signs and symptoms of liver injury like dark yellow or brown urine; general ill feeling or flu-like symptoms; light-colored stools; loss of appetite; nausea; right upper belly pain; unusually weak or tired; yellowing of the eyes or skin  swelling of ankles, feet, hands Side effects that usually do not require medical attention (report to your doctor or health care professional if they continue or are bothersome):  constipation  diarrhea  hair loss  loss of appetite  nausea  rash  vomiting This list may not describe all possible side effects. Call your doctor for medical advice about side effects. You may report side effects to FDA at 1-800-FDA-1088. Where should I keep my medicine? This drug is given in a hospital or clinic and will not be stored at home. NOTE: This sheet is a summary. It may not cover all  possible information. If you have questions about this medicine, talk to your doctor, pharmacist, or health care provider.  2020 Elsevier/Gold Standard (2018-02-27 18:06:11)    Nanoparticle Albumin-Bound Paclitaxel injection What is this medicine? NANOPARTICLE ALBUMIN-BOUND PACLITAXEL (Na no PAHR ti kuhl al BYOO muhn-bound PAK li TAX el) is a chemotherapy drug. It targets fast dividing cells, like cancer cells, and causes these cells to die. This medicine is used to treat advanced breast cancer, lung cancer, and pancreatic cancer. This medicine may be used for other purposes; ask your health care provider or pharmacist if you have questions. COMMON BRAND NAME(S): Abraxane What should I tell my health care provider before I take this medicine? They need to know if you have any of these conditions:  kidney disease  liver disease  low blood counts, like low white cell, platelet, or red cell counts  lung or breathing disease, like asthma  tingling of the fingers or toes, or other nerve disorder  an unusual or allergic reaction to paclitaxel, albumin, other chemotherapy, other medicines, foods, dyes, or preservatives  pregnant or trying to get pregnant  breast-feeding How should I use this medicine? This drug is given as an infusion into a vein. It is administered in a hospital or clinic by a specially trained health care   Talk to your pediatrician regarding the use of this medicine in children. Special care may be needed. Overdosage: If you think you have taken too much of this medicine contact a poison control center or emergency room at once. NOTE: This medicine is only for you. Do not share this medicine with others. What if I miss a dose? It is important not to miss your dose. Call your doctor or health care professional if you are unable to keep an appointment. What may interact with this medicine? This medicine may interact with the following  medications:  antiviral medicines for hepatitis, HIV or AIDS  certain antibiotics like erythromycin and clarithromycin  certain medicines for fungal infections like ketoconazole and itraconazole  certain medicines for seizures like carbamazepine, phenobarbital, phenytoin  gemfibrozil  nefazodone  rifampin  St. John's wort This list may not describe all possible interactions. Give your health care provider a list of all the medicines, herbs, non-prescription drugs, or dietary supplements you use. Also tell them if you smoke, drink alcohol, or use illegal drugs. Some items may interact with your medicine. What should I watch for while using this medicine? Your condition will be monitored carefully while you are receiving this medicine. You will need important blood work done while you are taking this medicine. This medicine can cause serious allergic reactions. If you experience allergic reactions like skin rash, itching or hives, swelling of the face, lips, or tongue, tell your doctor or health care professional right away. In some cases, you may be given additional medicines to help with side effects. Follow all directions for their use. This drug may make you feel generally unwell. This is not uncommon, as chemotherapy can affect healthy cells as well as cancer cells. Report any side effects. Continue your course of treatment even though you feel ill unless your doctor tells you to stop. Call your doctor or health care professional for advice if you get a fever, chills or sore throat, or other symptoms of a cold or flu. Do not treat yourself. This drug decreases your body's ability to fight infections. Try to avoid being around people who are sick. This medicine may increase your risk to bruise or bleed. Call your doctor or health care professional if you notice any unusual bleeding. Be careful brushing and flossing your teeth or using a toothpick because you may get an infection or bleed  more easily. If you have any dental work done, tell your dentist you are receiving this medicine. Avoid taking products that contain aspirin, acetaminophen, ibuprofen, naproxen, or ketoprofen unless instructed by your doctor. These medicines may hide a fever. Do not become pregnant while taking this medicine or for 6 months after stopping it. Women should inform their doctor if they wish to become pregnant or think they might be pregnant. Men should not father a child while taking this medicine or for 3 months after stopping it. There is a potential for serious side effects to an unborn child. Talk to your health care professional or pharmacist for more information. Do not breast-feed an infant while taking this medicine or for 2 weeks after stopping it. This medicine may interfere with the ability to get pregnant or to father a child. You should talk to your doctor or health care professional if you are concerned about your fertility. What side effects may I notice from receiving this medicine? Side effects that you should report to your doctor or health care professional as soon as possible:  allergic reactions like   like skin rash, itching or hives, swelling of the face, lips, or tongue  breathing problems  changes in vision  fast, irregular heartbeat  low blood pressure  mouth sores  pain, tingling, numbness in the hands or feet  signs of decreased platelets or bleeding - bruising, pinpoint red spots on the skin, black, tarry stools, blood in the urine  signs of decreased red blood cells - unusually weak or tired, feeling faint or lightheaded, falls  signs of infection - fever or chills, cough, sore throat, pain or difficulty passing urine  signs and symptoms of liver injury like dark yellow or brown urine; general ill feeling or flu-like symptoms; light-colored stools; loss of appetite; nausea; right upper belly pain; unusually weak or tired; yellowing of the eyes or skin  swelling of  the ankles, feet, hands  unusually slow heartbeat Side effects that usually do not require medical attention (report to your doctor or health care professional if they continue or are bothersome):  diarrhea  hair loss  loss of appetite  nausea, vomiting  tiredness This list may not describe all possible side effects. Call your doctor for medical advice about side effects. You may report side effects to FDA at 1-800-FDA-1088. Where should I keep my medicine? This drug is given in a hospital or clinic and will not be stored at home. NOTE: This sheet is a summary. It may not cover all possible information. If you have questions about this medicine, talk to your doctor, pharmacist, or health care provider.  2020 Elsevier/Gold Standard (2017-08-07 13:03:45)  

## 2020-05-08 NOTE — Progress Notes (Signed)
Hematology and Oncology Follow Up Visit  Melvin [name] [id number] 03/13/1961 59 y.o. 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  He still is having a tough time.  He comes in no wheelchair.  He still having some issues with shortness of breath and substernal chest pain.  We did go ahead and get a chest x-ray on him.  The chest x-ray show the metastatic disease to his lungs.  There is no fluid.  There is no obvious pneumonia.  I have to believe that the fact that his tumor has the K-RAS mutation might make it a little bit more resilient to chemotherapy.  There has been a decrease in his appetite.  He has had some anticipatory vomiting.  We will give him some lorazepam for this.  He has had no diarrhea.  He has had no leg swelling.  There is been no rashes.  I just wish that his overall quality of life was a little bit better.  I know that he is trying his best.  He is showing quite a bit of toughness right now.  He is on the Xarelto.  I have to believe that this is helping the pulmonary embolism.  Overall, I would have to say that his performance status is probably ECOG 2.     Medications:  Current Outpatient Medications:  .  atorvastatin (LIPITOR) 10 MG tablet, Take 1 tablet (10 mg total) by mouth daily., Disp: 90 tablet, Rfl: 1 .  dronabinol (MARINOL) 5 MG capsule, Take 1 capsule (5 mg total) by mouth 2 (two) times daily before lunch and supper., Disp: 60 capsule, Rfl: 0 .  fentaNYL (DURAGESIC) 12 MCG/HR, Place 1 patch onto the skin every 3 (three) days., Disp: 10 patch, Rfl: 0 .  fluconazole (DIFLUCAN) 100 MG tablet, Take 1  tablet (100 mg total) by mouth daily., Disp: 21 tablet, Rfl: 5 .  gabapentin (NEURONTIN) 300 MG capsule, Take 1 capsule (300 mg total) by mouth 3 (three) times daily., Disp: 90 capsule, Rfl: 1 .  HYDROcodone-acetaminophen (NORCO/VICODIN) 5-325 MG tablet, Take 1-2 tablets by mouth every 6 (six) hours as needed for moderate pain or severe pain., Disp: 120 tablet, Rfl: 0 .  HYDROcodone-homatropine (HYCODAN) 5-1.5 MG/5ML syrup, Take 5 mLs by mouth every 6 (six) hours as needed for cough., Disp: 240 mL, Rfl: 0 .  insulin aspart (NOVOLOG FLEXPEN) 100 UNIT/ML FlexPen, Inject 15 Units into the skin 3 (three) times daily with meals., Disp: 15 mL, Rfl: 11 .  Insulin Pen Needle (PEN NEEDLES) 30G X 8 MM MISC, 1 each by Does not apply route as directed., Disp: 200 each, Rfl: 99 .  lidocaine-prilocaine (EMLA) cream, Apply 1 application topically as needed. Use on portacath as directed approx 1-2 hours prior to chemotherapy, Disp: 30 g, Rfl: 0 .  loperamide (IMODIUM A-D) 2 MG capsule, Take 1 capsule (2 mg total) by mouth as needed for diarrhea or loose stools. Take 2 tablets at onset of diarrhea, then 1 every 2 hours until 12 hours without a BM., Disp: 100 capsule, Rfl: 1 .  LORazepam (ATIVAN) 0.5 MG tablet, Take  1 tablet (0.5 mg total) by mouth every 8 (eight) hours. As needed for nausea, Disp: 30 tablet, Rfl: 0 .  losartan (COZAAR) 25 MG tablet, Take 1 tablet (25 mg total) by mouth daily., Disp: 90 tablet, Rfl: 1 .  metFORMIN (GLUCOPHAGE) 1000 MG tablet, Take 1 tablet (1,000 mg total) by mouth 2 (two) times daily with a meal., Disp: 180 tablet, Rfl: 1 .  metoCLOPramide (REGLAN) 10 MG tablet, Take 0.5 tablets (5 mg total) by mouth as needed for nausea (nausea/reflux)., Disp: 30 tablet, Rfl: 3 .  ondansetron (ZOFRAN) 8 MG tablet, Take 1 tablet (8 mg total) by mouth 2 (two) times daily. As needed.  Start on Day 3 after chemotherapy., Disp: 30 tablet, Rfl: 1 .  ondansetron (ZOFRAN-ODT) 8 MG disintegrating tablet, Take 1  tablet (8 mg total) by mouth every 8 (eight) hours as needed for nausea., Disp: 40 tablet, Rfl: 3 .  Pancrelipase, Lip-Prot-Amyl, (CREON) 24000-76000 units CPEP, Take 3 capsules (72,000 Units total) by mouth in the morning, at noon, and at bedtime., Disp: 90 capsule, Rfl: 1 .  prochlorperazine (COMPAZINE) 10 MG tablet, Take 1 tablet (10 mg total) by mouth every 6 (six) hours as needed for nausea or vomiting., Disp: 30 tablet, Rfl: 0 .  rivaroxaban (XARELTO) 20 MG TABS tablet, Take 1 tablet (20 mg total) by mouth daily with supper., Disp: 30 tablet, Rfl: 6 .  RIVAROXABAN (XARELTO) VTE STARTER PACK (15 & 20 MG TABLETS), Follow package directions: Take one 1m tablet by mouth twice a day. On day 22, switch to one 271mtablet once a day. Take with food., Disp: 51 each, Rfl: 0 No current facility-administered medications for this visit.  Facility-Administered Medications Ordered in Other Visits:  .  LORazepam (ATIVAN) tablet 0.5 mg, 0.5 mg, Oral, Once, [place], Sarah M, NP .  sodium chloride flush (NS) 0.9 % injection 10 mL, 10 mL, Intravenous, PRN, [place], Sarah M, NP, 10 mL at 04/20/20 1104  Allergies: No Known Allergies  Past Medical History, Surgical history, Social history, and Family History were reviewed and updated.  Review of Systems: Review of Systems  Constitutional: Positive for unexpected weight change.  HENT:  Negative.   Eyes: Negative.   Respiratory: Negative.   Cardiovascular: Negative.   Gastrointestinal: Positive for vomiting.  Endocrine: Negative.   Genitourinary: Negative.    Musculoskeletal: Negative.   Skin: Negative.   Neurological: Negative.   Hematological: Negative.   Psychiatric/Behavioral: Negative.     Physical Exam:  vitals were not taken for this visit.   Wt Readings from Last 3 Encounters:  05/07/20 223 lb (101.2 kg)  05/06/20 223 lb (101.2 kg)  04/20/20 223 lb 8 oz (101.4 kg)    Physical Exam Vitals reviewed.  HENT:     Head:  Normocephalic and atraumatic.  Eyes:     Pupils: Pupils are equal, round, and reactive to light.  Cardiovascular:     Rate and Rhythm: Normal rate and regular rhythm.     Heart sounds: Normal heart sounds.  Pulmonary:     Effort: Pulmonary effort is normal.     Breath sounds: Normal breath sounds.  Abdominal:     General: Bowel sounds are normal.     Palpations: Abdomen is soft.  Musculoskeletal:        General: No tenderness or deformity. Normal range of motion.     Cervical back: Normal range of motion.  Lymphadenopathy:     Cervical: No cervical adenopathy.  Skin:  General: Skin is warm and dry.     Findings: No erythema or rash.  Neurological:     Mental Status: He is alert and oriented to person, place, and time.  Psychiatric:        Behavior: Behavior normal.        Thought Content: Thought content normal.        Judgment: Judgment normal.      Lab Results  Component Value Date   WBC 7.9 05/07/2020   HGB 11.6 (L) 05/07/2020   HCT 36.4 (L) 05/07/2020   MCV 93.8 05/07/2020   PLT 131 (L) 05/07/2020     Chemistry      Component Value Date/Time   NA 137 05/07/2020 0937   K 4.0 05/07/2020 0937   CL 99 05/07/2020 0937   CO2 25 05/07/2020 0937   BUN 14 05/07/2020 0937   CREATININE 0.58 (L) 05/07/2020 0937   CREATININE 0.75 03/08/2020 1058      Component Value Date/Time   CALCIUM 8.6 (L) 05/07/2020 0937   ALKPHOS 951 (H) 05/07/2020 0937   AST 89 (H) 05/07/2020 0937   ALT 109 (H) 05/07/2020 0937   BILITOT 2.0 (H) 05/07/2020 2902      Impression and Plan: Melvin [name] is a 59 year old white male.  He has metastatic adenocarcinoma the pancreas.  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  We will go ahead with chemotherapy today.  I just think that we have to try to treat him as I really think that his issues are related to the metastatic disease to his lungs.  Some of the pain might be from the pulmonary embolism and were  his lung was injured from lack of blood and oxygen.  I think the CA 19-9 will definitely tell us how things are going with him.  Again, I just want his quality of life to be better.  We will go ahead and refill some of his medications.  We will try to him back here in another 3 weeks.  We certainly may see him sooner given any changes in his status.    Volanda Napoleon, MD 5/22/202111:29 AM

## 2020-05-11 NOTE — Telephone Encounter (Signed)
I have called the [employer] rep, Johny Blamer, [phone] and left a message for samples. I do have a 30 day offer coupon. I believe it hasn't expired.

## 2020-05-12 ENCOUNTER — Inpatient Hospital Stay: Payer: PRIVATE HEALTH INSURANCE

## 2020-05-12 NOTE — Telephone Encounter (Signed)
Can we get the coupon to the patient, this is better than nothing, let's reach out to the drug rep again if we have not heard back by Friday

## 2020-05-13 ENCOUNTER — Other Ambulatory Visit: Payer: Self-pay

## 2020-05-13 DIAGNOSIS — C259 Malignant neoplasm of pancreas, unspecified: Secondary | ICD-10-CM

## 2020-05-13 NOTE — Telephone Encounter (Signed)
Can we confirm this? Please let's get the Rx to the patient ASAP. Consider this a verbal order for Eliquis 5 mg bid QS 90 refill 99

## 2020-05-13 NOTE — Telephone Encounter (Signed)
[facility] didn't have the samples. I called patient and left a message asking for a return call. Advising to pick up coupon card.

## 2020-05-13 NOTE — Telephone Encounter (Signed)
Dom should have the medication. I believe he had you sign for them.

## 2020-05-14 ENCOUNTER — Inpatient Hospital Stay: Payer: PRIVATE HEALTH INSURANCE

## 2020-05-14 ENCOUNTER — Inpatient Hospital Stay (HOSPITAL_BASED_OUTPATIENT_CLINIC_OR_DEPARTMENT_OTHER): Payer: PRIVATE HEALTH INSURANCE | Admitting: Family

## 2020-05-14 ENCOUNTER — Other Ambulatory Visit: Payer: Self-pay | Admitting: Family

## 2020-05-14 ENCOUNTER — Other Ambulatory Visit: Payer: Self-pay

## 2020-05-14 VITALS — BP 144/86 | HR 101 | Temp 98.8°F | Resp 20

## 2020-05-14 DIAGNOSIS — C259 Malignant neoplasm of pancreas, unspecified: Secondary | ICD-10-CM

## 2020-05-14 DIAGNOSIS — Z95828 Presence of other vascular implants and grafts: Secondary | ICD-10-CM

## 2020-05-14 DIAGNOSIS — E860 Dehydration: Secondary | ICD-10-CM

## 2020-05-14 DIAGNOSIS — C7800 Secondary malignant neoplasm of unspecified lung: Secondary | ICD-10-CM

## 2020-05-14 DIAGNOSIS — Z5111 Encounter for antineoplastic chemotherapy: Secondary | ICD-10-CM | POA: Diagnosis not present

## 2020-05-14 DIAGNOSIS — C787 Secondary malignant neoplasm of liver and intrahepatic bile duct: Secondary | ICD-10-CM

## 2020-05-14 LAB — CBC WITH DIFFERENTIAL (CANCER CENTER ONLY)
Abs Immature Granulocytes: 0.38 10*3/uL — ABNORMAL HIGH (ref 0.00–0.07)
Basophils Absolute: 0 10*3/uL (ref 0.0–0.1)
Basophils Relative: 0 %
Eosinophils Absolute: 0 10*3/uL (ref 0.0–0.5)
Eosinophils Relative: 1 %
HCT: 35.8 % — ABNORMAL LOW (ref 39.0–52.0)
Hemoglobin: 11.3 g/dL — ABNORMAL LOW (ref 13.0–17.0)
Immature Granulocytes: 6 %
Lymphocytes Relative: 14 %
Lymphs Abs: 0.9 10*3/uL (ref 0.7–4.0)
MCH: 30.1 pg (ref 26.0–34.0)
MCHC: 31.6 g/dL (ref 30.0–36.0)
MCV: 95.2 fL (ref 80.0–100.0)
Monocytes Absolute: 0.4 10*3/uL (ref 0.1–1.0)
Monocytes Relative: 6 %
Neutro Abs: 4.8 10*3/uL (ref 1.7–7.7)
Neutrophils Relative %: 73 %
Platelet Count: 113 10*3/uL — ABNORMAL LOW (ref 150–400)
RBC: 3.76 MIL/uL — ABNORMAL LOW (ref 4.22–5.81)
RDW: 17 % — ABNORMAL HIGH (ref 11.5–15.5)
WBC Count: 6.5 10*3/uL (ref 4.0–10.5)
nRBC: 0.5 % — ABNORMAL HIGH (ref 0.0–0.2)

## 2020-05-14 LAB — CMP (CANCER CENTER ONLY)
ALT: 154 U/L — ABNORMAL HIGH (ref 0–44)
AST: 96 U/L — ABNORMAL HIGH (ref 15–41)
Albumin: 3.1 g/dL — ABNORMAL LOW (ref 3.5–5.0)
Alkaline Phosphatase: 866 U/L — ABNORMAL HIGH (ref 38–126)
Anion gap: 14 (ref 5–15)
BUN: 11 mg/dL (ref 6–20)
CO2: 23 mmol/L (ref 22–32)
Calcium: 8.6 mg/dL — ABNORMAL LOW (ref 8.9–10.3)
Chloride: 101 mmol/L (ref 98–111)
Creatinine: 0.58 mg/dL — ABNORMAL LOW (ref 0.61–1.24)
GFR, Est AFR Am: >60 mL/min (ref >60)
GFR, Estimated: >60 mL/min (ref >60)
Glucose, Bld: 429 mg/dL — ABNORMAL HIGH (ref 70–99)
Potassium: 3.3 mmol/L — ABNORMAL LOW (ref 3.5–5.1)
Sodium: 138 mmol/L (ref 135–145)
Total Bilirubin: 1.9 mg/dL — ABNORMAL HIGH (ref 0.3–1.2)
Total Protein: 6 g/dL — ABNORMAL LOW (ref 6.5–8.1)

## 2020-05-14 MED ORDER — SODIUM CHLORIDE 0.9 % IV SOLN
1000.0000 mL | INTRAVENOUS | Status: AC
  Administered 2020-05-14: 1000 mL via INTRAVENOUS
  Filled 2020-05-14 (×2): qty 1000

## 2020-05-14 MED ORDER — PROCHLORPERAZINE MALEATE 10 MG PO TABS
ORAL_TABLET | ORAL | Status: AC
  Filled 2020-05-14: qty 1

## 2020-05-14 MED ORDER — SODIUM CHLORIDE 0.9 % IV SOLN
Freq: Once | INTRAVENOUS | Status: DC
  Filled 2020-05-14: qty 250

## 2020-05-14 MED ORDER — SODIUM CHLORIDE 0.9% FLUSH
10.0000 mL | Freq: Once | INTRAVENOUS | Status: AC
  Administered 2020-05-14: 10 mL via INTRAVENOUS
  Filled 2020-05-14: qty 10

## 2020-05-14 MED ORDER — SODIUM CHLORIDE 0.9 % IV SOLN
800.0000 mg/m2 | Freq: Once | INTRAVENOUS | Status: AC
  Administered 2020-05-14: 1862 mg via INTRAVENOUS
  Filled 2020-05-14: qty 48.97

## 2020-05-14 MED ORDER — HEPARIN SOD (PORK) LOCK FLUSH 100 UNIT/ML IV SOLN
500.0000 [IU] | Freq: Once | INTRAVENOUS | Status: AC | PRN
  Administered 2020-05-14: 500 [IU]
  Filled 2020-05-14: qty 5

## 2020-05-14 MED ORDER — PROCHLORPERAZINE MALEATE 10 MG PO TABS
10.0000 mg | ORAL_TABLET | Freq: Once | ORAL | Status: AC
  Administered 2020-05-14: 10 mg via ORAL

## 2020-05-14 MED ORDER — SODIUM CHLORIDE 0.9% FLUSH
10.0000 mL | INTRAVENOUS | Status: DC | PRN
  Administered 2020-05-14: 10 mL
  Filled 2020-05-14: qty 10

## 2020-05-14 MED ORDER — PACLITAXEL PROTEIN-BOUND CHEMO INJECTION 100 MG
100.0000 mg/m2 | Freq: Once | INTRAVENOUS | Status: AC
  Administered 2020-05-14: 225 mg via INTRAVENOUS
  Filled 2020-05-14: qty 45

## 2020-05-14 MED ORDER — SODIUM CHLORIDE 0.9 % IV SOLN
1000.0000 mL | INTRAVENOUS | Status: AC
  Filled 2020-05-14: qty 1000

## 2020-05-14 NOTE — Progress Notes (Signed)
Hematology and Oncology Follow Up Visit  Melvin [name] [id number] [birth year] 59 y.o. 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 is here today for treatment. He had 2 falls this morning where he landed on his knees and did not hit his head. He states that he is still just feeling weak and fatigued. He did not feel like he needed to go to the ED at this time but will if he develops any symptoms.  He had an episodes of nausea and vomiting this morning which typically occurs on treatment days.  He still has SOB with any exertion secondary to PE. He is doing well on Xarelto and has not noted any blood loss. No bruising or petechiae.  He has an occasional headaches which resolves with taking Tylenol (occurs every 3 days of 20) No fever, chills, cough, rash, dizziness, chest pain, palpitations, abdominal pain or changes in bowel or bladder habits.  No swelling, tenderness, numbness or tingling in his extremities at this time.  His appetite comes and goes. He states that nothing tastes good. He is doing his best to stay hydrated. His weight is stable.   ECOG Performance Status: 1 - Symptomatic but completely ambulatory  Medications:  Allergies as of 05/14/2020   No Known Allergies     Medication List       Accurate as of May 14, 2020 12:46 PM. If you have any questions, ask your nurse or doctor.        atorvastatin 10 MG tablet Commonly known as: LIPITOR Take 1 tablet (10 mg total) by mouth daily.   Creon 24000-76000 units Cpep Generic drug: Pancrelipase (Lip-Prot-Amyl) Take 3 capsules (72,000 Units total) by mouth in the morning, at noon, and  at bedtime.   dexamethasone 4 MG tablet Commonly known as: DECADRON Take 4 mg by mouth 2 (two) times daily with a meal. Takes 2 tabs PO daily, start day after chemo x 3 days with food.   dronabinol 5 MG capsule Commonly known as: MARINOL Take 1 capsule (5 mg total) by mouth 2 (two) times daily before lunch and supper.   fentaNYL 12 MCG/HR Commonly known as: [place] 1 patch onto the skin every 3 (three) days.   fluconazole 100 MG tablet Commonly known as: DIFLUCAN Take 1 tablet (100 mg total) by mouth daily.   gabapentin 300 MG capsule Commonly known as: NEURONTIN Take 1 capsule (300 mg total) by mouth 3 (three) times daily.   HYDROcodone-acetaminophen 5-325 MG tablet Commonly known as: NORCO/VICODIN Take 1-2 tablets by mouth every 6 (six) hours as needed for moderate pain or severe pain.   HYDROcodone-homatropine 5-1.5 MG/5ML syrup Commonly known as: Hycodan Take 5 mLs by mouth every 6 (six) hours as needed for cough.   lidocaine-prilocaine cream Commonly known as: EMLA Apply 1 application topically as needed. Use on portacath as directed approx 1-2 hours prior to chemotherapy   loperamide 2 MG capsule Commonly known as: Imodium A-D Take 1 capsule (2 mg total) by mouth as needed for diarrhea or loose stools. Take 2 tablets at onset of diarrhea, then 1 every 2 hours until 12 hours without  a BM.   LORazepam 0.5 MG tablet Commonly known as: ATIVAN Take 1 tablet (0.5 mg total) by mouth every 8 (eight) hours. As needed for nausea   losartan 25 MG tablet Commonly known as: COZAAR Take 1 tablet (25 mg total) by mouth daily.   metFORMIN 1000 MG tablet Commonly known as: GLUCOPHAGE Take 1 tablet (1,000 mg total) by mouth 2 (two) times daily with a meal.   metoCLOPramide 10 MG tablet Commonly known as: REGLAN Take 0.5 tablets (5 mg total) by mouth as needed for nausea (nausea/reflux).   NovoLOG FlexPen 100 UNIT/ML FlexPen Generic drug: insulin aspart Inject 15  Units into the skin 3 (three) times daily with meals. What changed: how much to take   ondansetron 8 MG disintegrating tablet Commonly known as: ZOFRAN-ODT Take 1 tablet (8 mg total) by mouth every 8 (eight) hours as needed for nausea.   ondansetron 8 MG tablet Commonly known as: ZOFRAN Take 1 tablet (8 mg total) by mouth 2 (two) times daily. As needed.  Start on Day 3 after chemotherapy.   Pen Needles 30G X 8 MM Misc 1 each by Does not apply route as directed.   prochlorperazine 10 MG tablet Commonly known as: COMPAZINE Take 1 tablet (10 mg total) by mouth every 6 (six) hours as needed for nausea or vomiting.   rivaroxaban 20 MG Tabs tablet Commonly known as: XARELTO Take 1 tablet (20 mg total) by mouth daily with supper. What changed: Another medication with the same name was removed. Continue taking this medication, and follow the directions you see here.       Allergies: No Known Allergies  Past Medical History, Surgical history, Social history, and Family History were reviewed and updated.  Review of Systems: All other 10 point review of systems is negative.   Physical Exam:  vitals were not taken for this visit.   Wt Readings from Last 3 Encounters:  05/07/20 223 lb (101.2 kg)  05/06/20 223 lb (101.2 kg)  04/20/20 223 lb 8 oz (101.4 kg)    Ocular: Sclerae unicteric, pupils equal, round and reactive to light Ear-nose-throat: Oropharynx clear, dentition fair Lymphatic: No cervical or supraclavicular adenopathy Lungs no rales or rhonchi, good excursion bilaterally Heart regular rate and rhythm, no murmur appreciated Abd soft, nontender, positive bowel sounds, no liver or spleen tip palpated on exam, no fluid wave  MSK no focal spinal tenderness, no joint edema Neuro: non-focal, well-oriented, appropriate affect Breasts: Deferred   Lab Results  Component Value Date   WBC 6.5 05/14/2020   HGB 11.3 (L) 05/14/2020   HCT 35.8 (L) 05/14/2020   MCV 95.2 05/14/2020    PLT 113 (L) 05/14/2020   No results found for: FERRITIN, IRON, TIBC, UIBC, IRONPCTSAT Lab Results  Component Value Date   RBC 3.76 (L) 05/14/2020   No results found for: KPAFRELGTCHN, LAMBDASER, KAPLAMBRATIO No results found for: IGGSERUM, IGA, IGMSERUM No results found for: Ronnald Ramp, A1GS, A2GS, Violet Baldy, MSPIKE, SPEI   Chemistry      Component Value Date/Time   NA 138 05/14/2020 1027   K 3.3 (L) 05/14/2020 1027   CL 101 05/14/2020 1027   CO2 23 05/14/2020 1027   BUN 11 05/14/2020 1027   CREATININE 0.58 (L) 05/14/2020 1027   CREATININE 0.75 03/08/2020 1058      Component Value Date/Time   CALCIUM 8.6 (L) 05/14/2020 1027   ALKPHOS 866 (H) 05/14/2020 1027   AST 96 (H) 05/14/2020 1027   ALT 154 (  H) 05/14/2020 1027   BILITOT 1.9 (H) 05/14/2020 1027       Impression and Plan: Ms. Morina is a very pleasant 59 yo caucasian gentleman with metastatic adenocarcinoma of the pancreas with extensive disease involving the liver, lungs and lymph nodes.  I spoke with both the patient and his daughter. Patient would like to proceed with treatment.  I spoke with Lattie Haw in pharmacy and will proceed with treatment today as planned.  LFT's are elevated with known liver metastasis.  We will plan to see him again in 2 weeks and redraw his CA 19-9 at that time. Both he and his family know to contact our office with any questions or concerns. We can certainly see him sooner if needed.    Laverna Peace, NP 5/28/202112:46 PM

## 2020-05-14 NOTE — Progress Notes (Signed)
05/14/2020 Daughter presents to reception area stating Melvin [name] is downstairs in lobby bathroom with and nausea also reports normal bowel movement . Also states fell at home this morning striking his both knees. When arriving to our unit, gagging.  Spoke with wife via phone regarding medication. Notified [facility] [place].

## 2020-05-14 NOTE — Patient Instructions (Addendum)
[facility] Discharge Instructions for Patients Receiving Chemotherapy  Today you received the following chemotherapy agents Abraxane and Gemzar  To help prevent nausea and vomiting after your treatment, we encourage you to take your nausea medication as prescribed by MD.   If you develop nausea and vomiting that is not controlled by your nausea medication, call the clinic.   BELOW ARE SYMPTOMS THAT SHOULD BE REPORTED IMMEDIATELY:  *FEVER GREATER THAN 100.5 F  *CHILLS WITH OR WITHOUT FEVER  NAUSEA AND VOMITING THAT IS NOT CONTROLLED WITH YOUR NAUSEA MEDICATION  *UNUSUAL SHORTNESS OF BREATH  *UNUSUAL BRUISING OR BLEEDING  TENDERNESS IN MOUTH AND THROAT WITH OR WITHOUT PRESENCE OF ULCERS  *URINARY PROBLEMS  *BOWEL PROBLEMS  UNUSUAL RASH Items with * indicate a potential emergency and should be followed up as soon as possible.  Feel free to call the clinic should you have any questions or concerns. The clinic phone number is [phone].  Please show the [facility] at check-in to the Emergency Department and triage nurse.    Dehydration, Adult Dehydration is a condition in which there is not enough water or other fluids in the body. This happens when a person loses more fluids than he or she takes in. Important organs, such as the kidneys, brain, and heart, cannot function without a proper amount of fluids. Any loss of fluids from the body can lead to dehydration. Dehydration can be mild, moderate, or severe. It should be treated right away to prevent it from becoming severe. What are the causes? Dehydration may be caused by:  Conditions that cause loss of water or other fluids, such as diarrhea, vomiting, or sweating or urinating a lot.  Not drinking enough fluids, especially when you are ill or doing activities that require a lot of energy.  Other illnesses and conditions, such as fever or infection.  Certain medicines, such as medicines that  remove excess fluid from the body (diuretics).  Lack of safe drinking water.  Not being able to get enough water and food. What increases the risk? The following factors may make you more likely to develop this condition:  Having a long-term (chronic) illness that has not been treated properly, such as diabetes, heart disease, or kidney disease.  Being 43 years of age or older.  Having a disability.  Living in a place that is high in altitude, where thinner, drier air causes more fluid loss.  Doing exercises that put stress on your body for a long time (endurance sports). What are the signs or symptoms? Symptoms of dehydration depend on how severe it is. Mild or moderate dehydration  Thirst.  Dry lips or dry mouth.  Dizziness or light-headedness, especially when standing up from a seated position.  Muscle cramps.  Dark urine. Urine may be the color of tea.  Less urine or tears produced than usual.  Headache. Severe dehydration  Changes in skin. Your skin may be cold and clammy, blotchy, or pale. Your skin also may not return to normal after being lightly pinched and released.  Little or no tears, urine, or sweat.  Changes in vital signs, such as rapid breathing and low blood pressure. Your pulse may be weak or may be faster than 100 beats a minute when you are sitting still.  Other changes, such as: ? Feeling very thirsty. ? Sunken eyes. ? Cold hands and feet. ? Confusion. ? Being very tired (lethargic) or having trouble waking from sleep. ? Short-term weight loss. ?  Loss of consciousness. How is this diagnosed? This condition is diagnosed based on your symptoms and a physical exam. You may have blood and urine tests to help confirm the diagnosis. How is this treated? Treatment for this condition depends on how severe it is. Treatment should be started right away. Do not wait until dehydration becomes severe. Severe dehydration is an emergency and needs to be  treated in a hospital.  Mild or moderate dehydration can be treated at home. You may be asked to: ? Drink more fluids. ? Drink an oral rehydration solution (ORS). This drink helps restore proper amounts of fluids and salts and minerals in the blood (electrolytes).  Severe dehydration can be treated: ? With IV fluids. ? By correcting abnormal levels of electrolytes. This is often done by giving electrolytes through a tube that is passed through your nose and into your stomach (nasogastric tube, or NG tube). ? By treating the underlying cause of dehydration. Follow these instructions at home: Oral rehydration solution If told by your health care provider, drink an ORS:  Make an ORS by following instructions on the package.  Start by drinking small amounts, about  cup (120 mL) every 5-10 minutes.  Slowly increase how much you drink until you have taken the amount recommended by your health care provider. Eating and drinking         Drink enough clear fluid to keep your urine pale yellow. If you were told to drink an ORS, finish the ORS first and then start slowly drinking other clear fluids. Drink fluids such as: ? Water. Do not drink only water. Doing that can lead to hyponatremia, which is having too little salt (sodium) in the body. ? Water from ice chips you suck on. ? Fruit juice that you have added water to (diluted fruit juice). ? Low-calorie sports drinks.  Eat foods that contain a healthy balance of electrolytes, such as bananas, oranges, potatoes, tomatoes, and spinach.  Do not drink alcohol.  Avoid the following: ? Drinks that contain a lot of sugar. These include high-calorie sports drinks, fruit juice that is not diluted, and soda. ? Caffeine. ? Foods that are greasy or contain a lot of fat or sugar. General instructions  Take over-the-counter and prescription medicines only as told by your health care provider.  Do not take sodium tablets. Doing that can lead  to having too much sodium in the body (hypernatremia).  Return to your normal activities as told by your health care provider. Ask your health care provider what activities are safe for you.  Keep all follow-up visits as told by your health care provider. This is important. Contact a health care provider if:  You have muscle cramps, pain, or discomfort, such as: ? Pain in your abdomen and the pain gets worse or stays in one area (localizes). ? Stiff neck.  You have a rash.  You are more irritable than usual.  You are sleepier or have a harder time waking than usual.  You feel weak or dizzy.  You feel very thirsty. Get help right away if you have:  Any symptoms of severe dehydration.  Symptoms of vomiting, such as: ? You cannot eat or drink without vomiting. ? Vomiting gets worse or does not go away. ? Vomit includes blood or green matter (bile).  Symptoms that get worse with treatment.  A fever.  A severe headache.  Problems with urination or bowel movements, such as: ? Diarrhea that gets worse or does not  go away. ? Blood in your stool (feces). This may cause stool to look black and tarry. ? Not urinating, or urinating only a small amount of very dark urine, within 6-8 hours.  Trouble breathing. These symptoms may represent a serious problem that is an emergency. Do not wait to see if the symptoms will go away. Get medical help right away. Call your local emergency services (911 in the [place] Do not drive yourself to the hospital. Summary  Dehydration is a condition in which there is not enough water or other fluids in the body. This happens when a person loses more fluids than he or she takes in.  Treatment for this condition depends on how severe it is. Treatment should be started right away. Do not wait until dehydration becomes severe.  Drink enough clear fluid to keep your urine pale yellow. If you were told to drink an oral rehydration solution (ORS), finish the  ORS first and then start slowly drinking other clear fluids.  Take over-the-counter and prescription medicines only as told by your health care provider.  Get help right away if you have any symptoms of severe dehydration. This information is not intended to replace advice given to you by your health care provider. Make sure you discuss any questions you have with your health care provider. Document Revised: 07/17/2019 Document Reviewed: 07/17/2019 Elsevier Patient Education  [place].

## 2020-05-18 ENCOUNTER — Telehealth: Payer: Self-pay | Admitting: Osteopathic Medicine

## 2020-05-18 MED FILL — GABAPENTIN 300 MG CAPSULE: 300 | 30 days supply | Qty: 90 | Fill #1

## 2020-05-18 MED FILL — CREON DR 24,000 UNITS CAP: 24000-76000 | 10 days supply | Qty: 90 | Fill #1

## 2020-05-18 NOTE — Telephone Encounter (Signed)
Were unable to get samples of Xarelto but we were able to get Eliquis which should be an aceptable alternative to Xarelto - FYI routed to Dr Marin Olp in case any other input? Thanks!   Eliquis Dose: 5 mg bid Total supply for 10 weeks! Phebe Colla!  Can use up the Xarelto and start the Eliquis the following day at same time he'd usually take the Xarelto

## 2020-05-18 NOTE — Telephone Encounter (Signed)
Attempted to contact pt regarding to pick up samples of eliquis 5 mg (10 boxes). No answer left a detailed vm msg. Aware samples will be available for pick up at the front desk.    Lot: [id number] Exp: MAR 2023

## 2020-05-18 NOTE — Telephone Encounter (Signed)
Attempted to contact pt regarding to pick up samples of eliquis 5 mg (10 boxes). No answer left a detailed vm msg. Aware samples will be available for pick up at the front desk.   Lot: [id number] Exp: MAR 2023

## 2020-05-19 MED FILL — FLUCONAZOLE 100 MG TABLET: 100 | 21 days supply | Qty: 21 | Fill #1

## 2020-05-21 NOTE — Progress Notes (Signed)
Pharmacist Chemotherapy Monitoring - Follow Up Assessment    I verify that I have reviewed each item in the below checklist:  . Regimen for the patient is scheduled for the appropriate day and plan matches scheduled date. Marland Kitchen Appropriate non-routine labs are ordered dependent on drug ordered. . If applicable, additional medications reviewed and ordered per protocol based on lifetime cumulative doses and/or treatment regimen.   Plan for follow-up and/or issues identified: No . I-vent associated with next due treatment: Yes . MD and/or nursing notified: No   Kennith Center, Pharm.D., CPP 05/21/2020@3 :12 PM

## 2020-05-28 ENCOUNTER — Other Ambulatory Visit: Payer: Self-pay

## 2020-05-28 ENCOUNTER — Inpatient Hospital Stay: Payer: PRIVATE HEALTH INSURANCE | Attending: Hematology & Oncology | Admitting: Hematology & Oncology

## 2020-05-28 ENCOUNTER — Inpatient Hospital Stay: Payer: PRIVATE HEALTH INSURANCE

## 2020-05-28 ENCOUNTER — Encounter: Payer: Self-pay | Admitting: *Deleted

## 2020-05-28 ENCOUNTER — Other Ambulatory Visit: Payer: Self-pay | Admitting: *Deleted

## 2020-05-28 ENCOUNTER — Other Ambulatory Visit: Payer: Self-pay | Admitting: Hematology & Oncology

## 2020-05-28 ENCOUNTER — Encounter: Payer: Self-pay | Admitting: Hematology & Oncology

## 2020-05-28 ENCOUNTER — Ambulatory Visit (HOSPITAL_BASED_OUTPATIENT_CLINIC_OR_DEPARTMENT_OTHER)
Admission: RE | Admit: 2020-05-28 | Discharge: 2020-05-28 | Disposition: A | Discharge status: Home / Self Care | Payer: PRIVATE HEALTH INSURANCE | Source: Ambulatory Visit | Attending: Hematology & Oncology | Admitting: Hematology & Oncology

## 2020-05-28 VITALS — BP 121/82 | HR 110 | Temp 96.9°F | Resp 18 | Ht 75.0 in | Wt 231.1 lb

## 2020-05-28 VITALS — Wt 231.1 lb

## 2020-05-28 DIAGNOSIS — E119 Type 2 diabetes mellitus without complications: Secondary | ICD-10-CM | POA: Insufficient documentation

## 2020-05-28 DIAGNOSIS — I2699 Other pulmonary embolism without acute cor pulmonale: Secondary | ICD-10-CM | POA: Diagnosis not present

## 2020-05-28 DIAGNOSIS — Z5111 Encounter for antineoplastic chemotherapy: Secondary | ICD-10-CM | POA: Insufficient documentation

## 2020-05-28 DIAGNOSIS — C787 Secondary malignant neoplasm of liver and intrahepatic bile duct: Secondary | ICD-10-CM

## 2020-05-28 DIAGNOSIS — C7800 Secondary malignant neoplasm of unspecified lung: Secondary | ICD-10-CM | POA: Insufficient documentation

## 2020-05-28 DIAGNOSIS — Z7901 Long term (current) use of anticoagulants: Secondary | ICD-10-CM | POA: Diagnosis not present

## 2020-05-28 DIAGNOSIS — I82411 Acute embolism and thrombosis of right femoral vein: Secondary | ICD-10-CM | POA: Diagnosis not present

## 2020-05-28 DIAGNOSIS — I82451 Acute embolism and thrombosis of right peroneal vein: Secondary | ICD-10-CM | POA: Diagnosis not present

## 2020-05-28 DIAGNOSIS — Z79899 Other long term (current) drug therapy: Secondary | ICD-10-CM | POA: Insufficient documentation

## 2020-05-28 DIAGNOSIS — R197 Diarrhea, unspecified: Secondary | ICD-10-CM | POA: Diagnosis not present

## 2020-05-28 DIAGNOSIS — Z95828 Presence of other vascular implants and grafts: Secondary | ICD-10-CM

## 2020-05-28 DIAGNOSIS — Z794 Long term (current) use of insulin: Secondary | ICD-10-CM | POA: Insufficient documentation

## 2020-05-28 DIAGNOSIS — R1110 Vomiting, unspecified: Secondary | ICD-10-CM | POA: Insufficient documentation

## 2020-05-28 DIAGNOSIS — C259 Malignant neoplasm of pancreas, unspecified: Secondary | ICD-10-CM | POA: Insufficient documentation

## 2020-05-28 DIAGNOSIS — I8290 Acute embolism and thrombosis of unspecified vein: Secondary | ICD-10-CM

## 2020-05-28 DIAGNOSIS — C779 Secondary and unspecified malignant neoplasm of lymph node, unspecified: Secondary | ICD-10-CM | POA: Insufficient documentation

## 2020-05-28 HISTORY — DX: Acute embolism and thrombosis of right femoral vein: I82.411

## 2020-05-28 LAB — CMP (CANCER CENTER ONLY)
ALT: 62 U/L — ABNORMAL HIGH (ref 0–44)
AST: 51 U/L — ABNORMAL HIGH (ref 15–41)
Albumin: 3 g/dL — ABNORMAL LOW (ref 3.5–5.0)
Alkaline Phosphatase: 707 U/L — ABNORMAL HIGH (ref 38–126)
Anion gap: 11 (ref 5–15)
BUN: 9 mg/dL (ref 6–20)
CO2: 29 mmol/L (ref 22–32)
Calcium: 8.3 mg/dL — ABNORMAL LOW (ref 8.9–10.3)
Chloride: 102 mmol/L (ref 98–111)
Creatinine: 0.55 mg/dL — ABNORMAL LOW (ref 0.61–1.24)
GFR, Est AFR Am: >60 mL/min (ref >60)
GFR, Estimated: >60 mL/min (ref >60)
Glucose, Bld: 305 mg/dL — ABNORMAL HIGH (ref 70–99)
Potassium: 3.4 mmol/L — ABNORMAL LOW (ref 3.5–5.1)
Sodium: 142 mmol/L (ref 135–145)
Total Bilirubin: 1.2 mg/dL (ref 0.3–1.2)
Total Protein: 5.6 g/dL — ABNORMAL LOW (ref 6.5–8.1)

## 2020-05-28 LAB — CBC WITH DIFFERENTIAL (CANCER CENTER ONLY)
Abs Immature Granulocytes: 0.08 10*3/uL — ABNORMAL HIGH (ref 0.00–0.07)
Basophils Absolute: 0 10*3/uL (ref 0.0–0.1)
Basophils Relative: 0 %
Eosinophils Absolute: 0 10*3/uL (ref 0.0–0.5)
Eosinophils Relative: 1 %
HCT: 36.5 % — ABNORMAL LOW (ref 39.0–52.0)
Hemoglobin: 11.3 g/dL — ABNORMAL LOW (ref 13.0–17.0)
Immature Granulocytes: 1 %
Lymphocytes Relative: 13 %
Lymphs Abs: 0.9 10*3/uL (ref 0.7–4.0)
MCH: 30.2 pg (ref 26.0–34.0)
MCHC: 31 g/dL (ref 30.0–36.0)
MCV: 97.6 fL (ref 80.0–100.0)
Monocytes Absolute: 0.9 10*3/uL (ref 0.1–1.0)
Monocytes Relative: 13 %
Neutro Abs: 5 10*3/uL (ref 1.7–7.7)
Neutrophils Relative %: 72 %
Platelet Count: 226 10*3/uL (ref 150–400)
RBC: 3.74 MIL/uL — ABNORMAL LOW (ref 4.22–5.81)
RDW: 18.4 % — ABNORMAL HIGH (ref 11.5–15.5)
WBC Count: 7 10*3/uL (ref 4.0–10.5)
nRBC: 0 % (ref 0.0–0.2)

## 2020-05-28 LAB — LACTATE DEHYDROGENASE: LDH: 334 U/L — ABNORMAL HIGH (ref 98–192)

## 2020-05-28 IMAGING — CR DG CHEST 2V
2 series · 2 of 2 positions shown · non-contrast
Comparison: Chest x-ray [DATE].

CLINICAL DATA: 58-year-old male with history of metastatic
pancreatic cancer. Recent history of fall.

EXAM:
CHEST - 2 VIEW

[w chest pa]
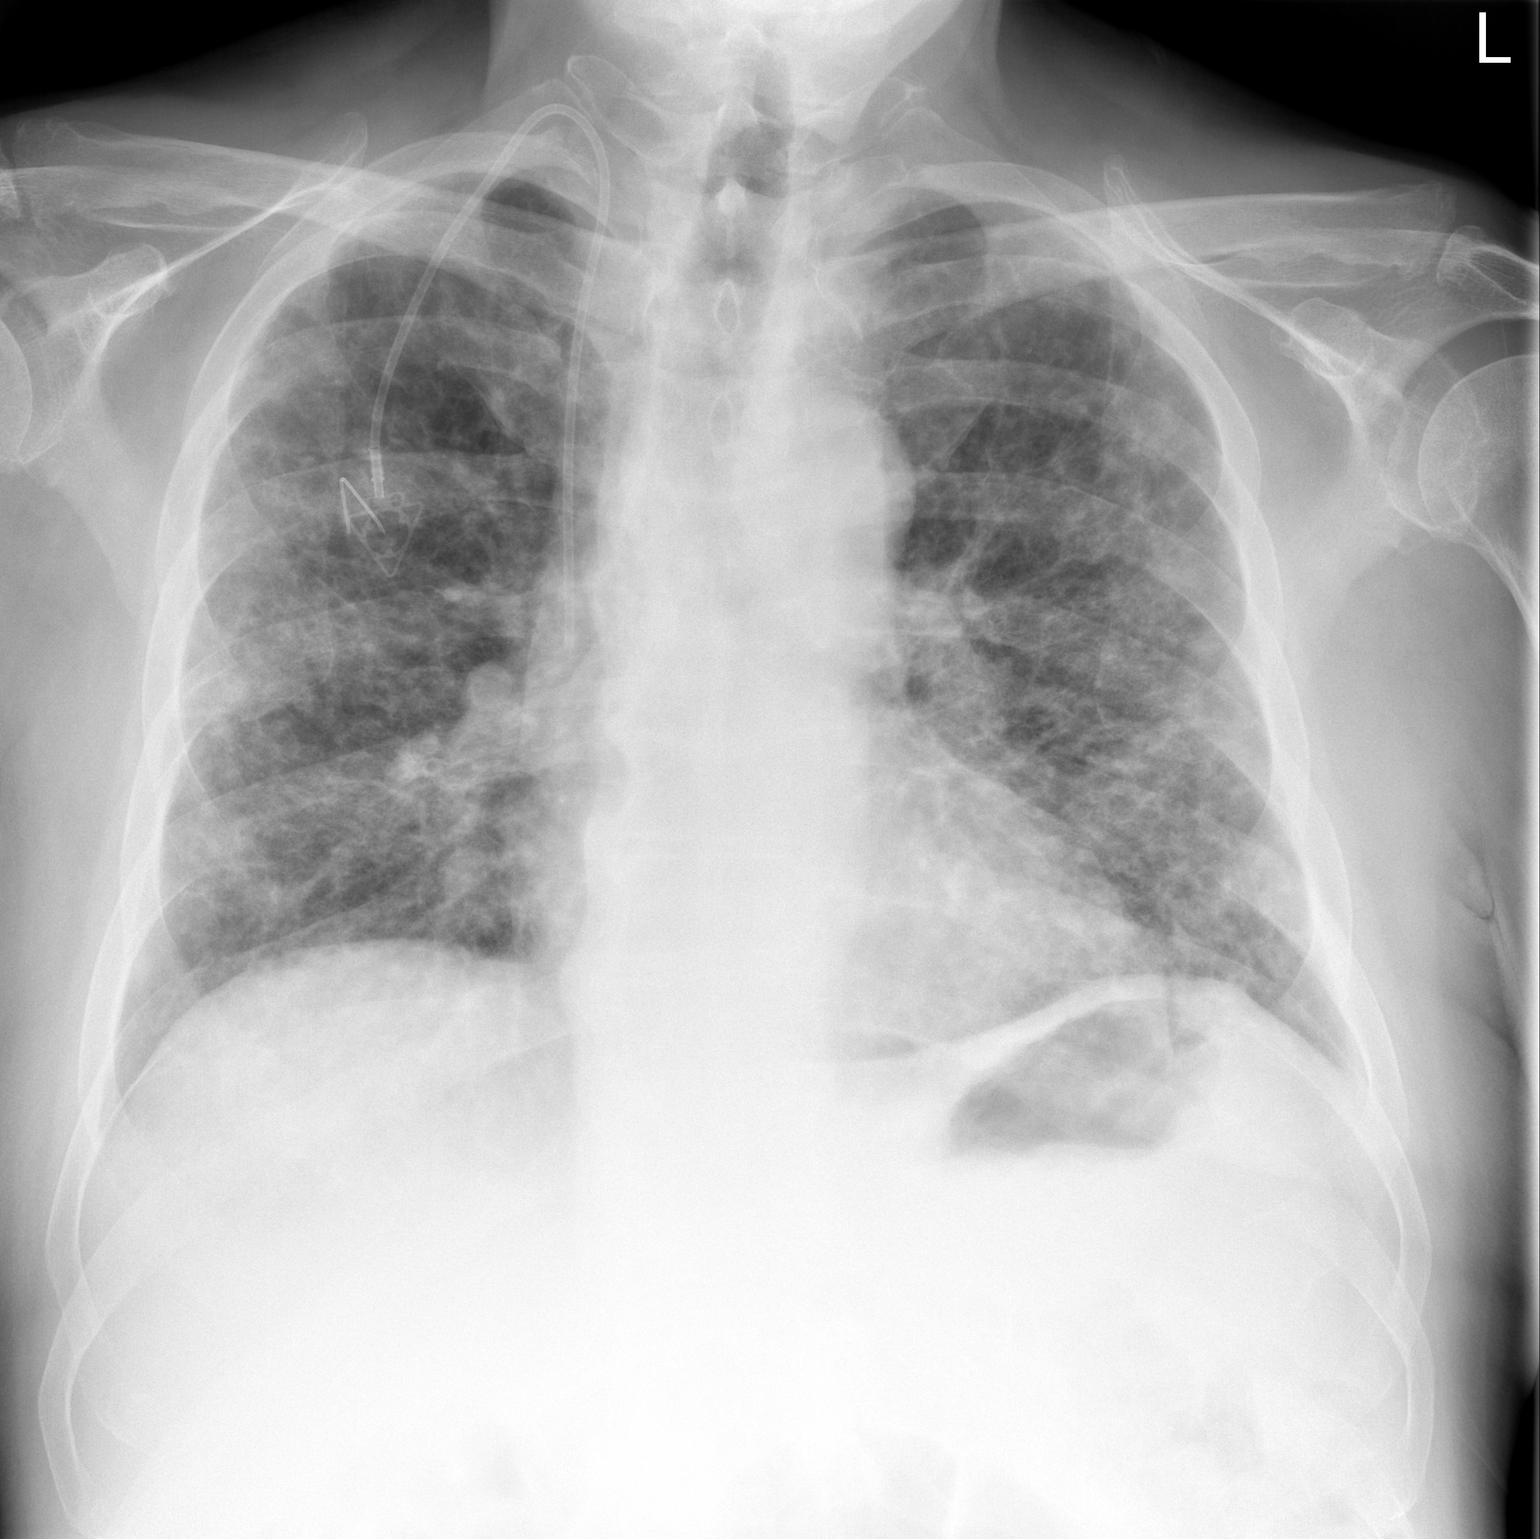

[w chest lat]
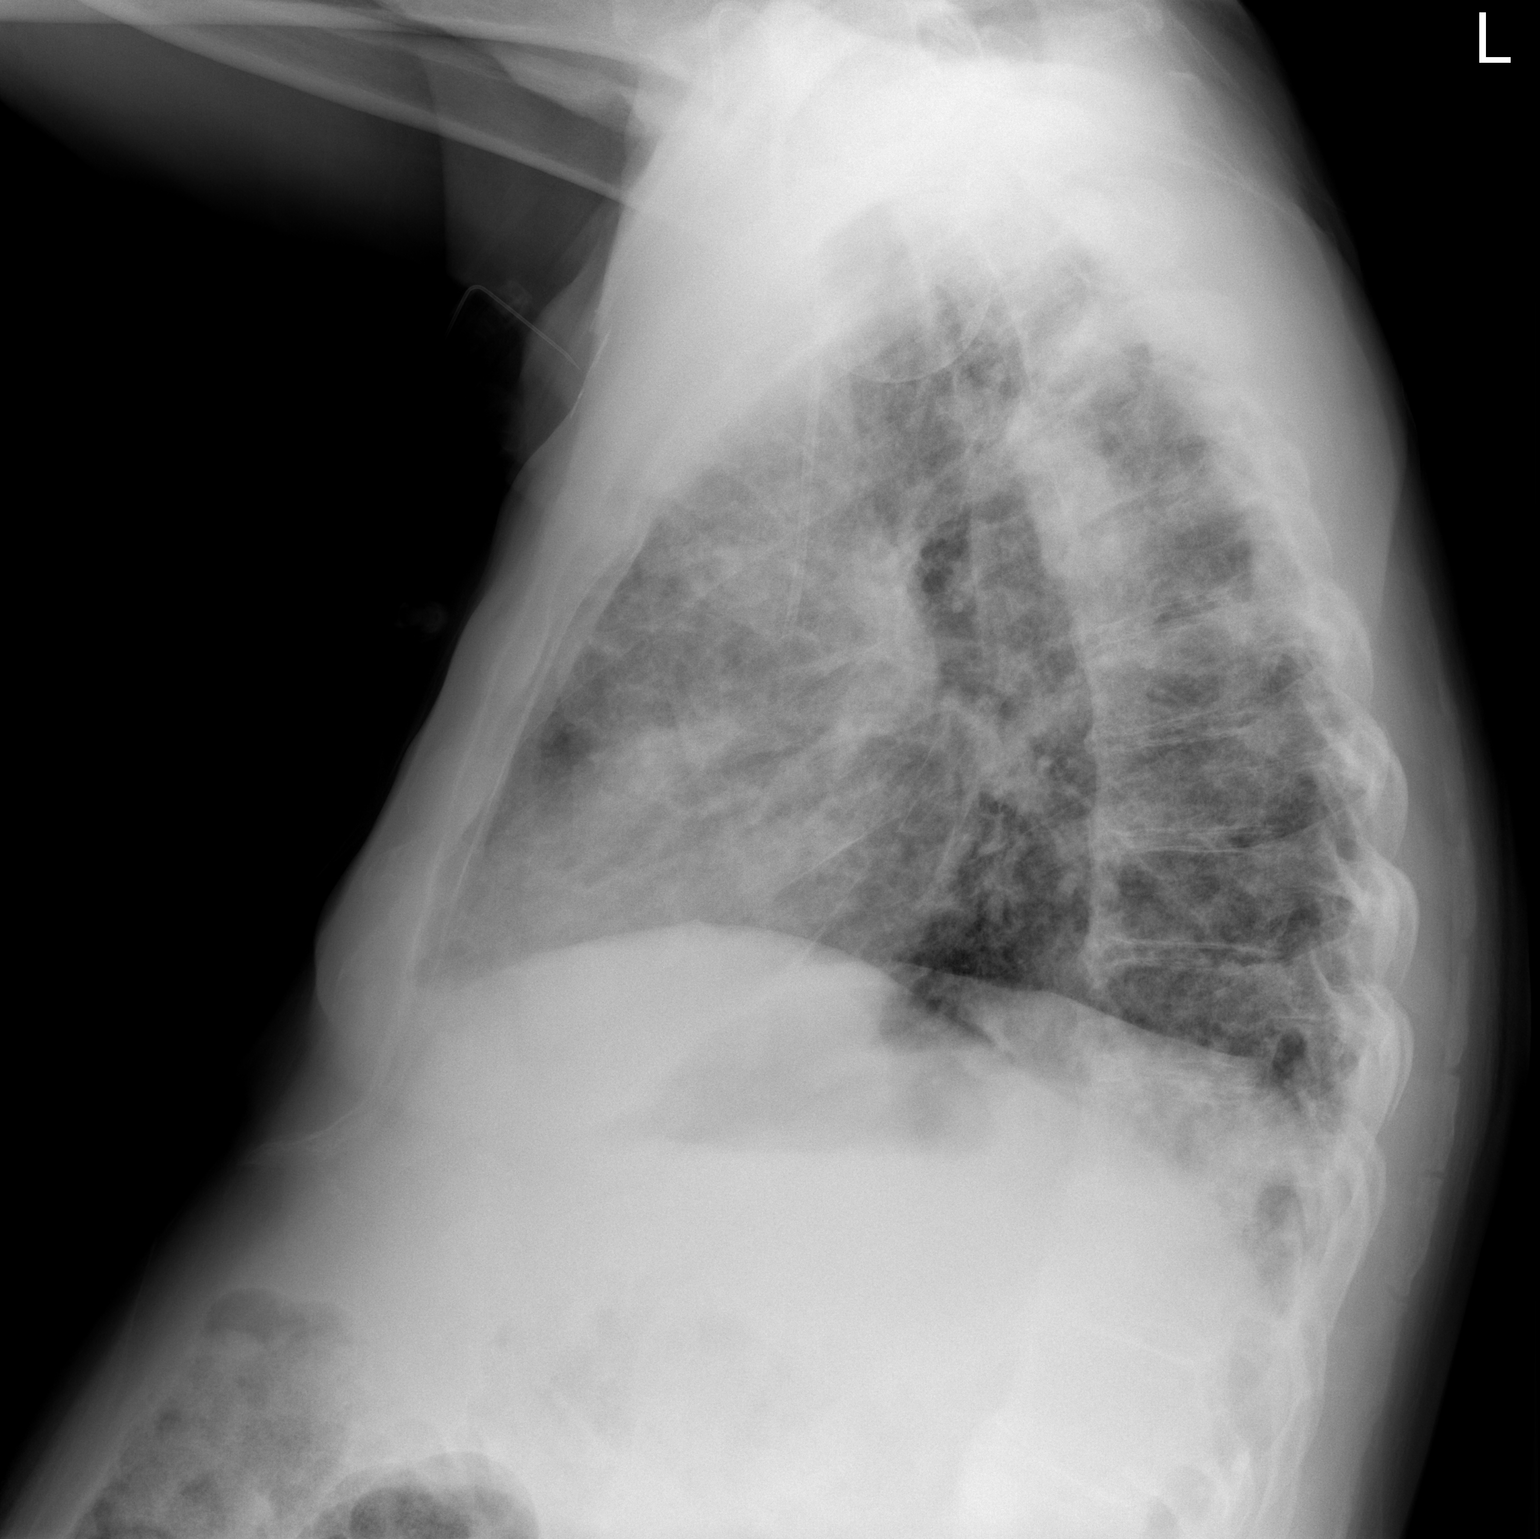

[2 of 2 positions shown; findings below may reference images not displayed]

FINDINGS: Right internal jugular single-lumen power porta cath with tip
terminating in the distal superior vena cava. Diffuse
reticulonodular pattern throughout the lungs bilaterally, very
similar to the prior study. No confluent consolidative airspace
disease. No pleural effusions. No evidence of pulmonary edema. No
pneumothorax. Heart size is normal. Upper mediastinal contours are
within normal limits.
IMPRESSION: 1. Widespread metastatic disease to the lungs appear similar to the
prior examination, without definite radiographic evidence of acute
cardiopulmonary disease.

## 2020-05-28 IMAGING — US US EXTREM LOW VENOUS*R*
1 series · 13 of 24 positions shown · non-contrast
Comparison: None.

CLINICAL DATA: Right lower extremity pain and edema. History of
malignancy. Evaluate for DVT.



[Series 1: us extrem low venous*right* · 13 of 33 slices shown]
[im 1/33]
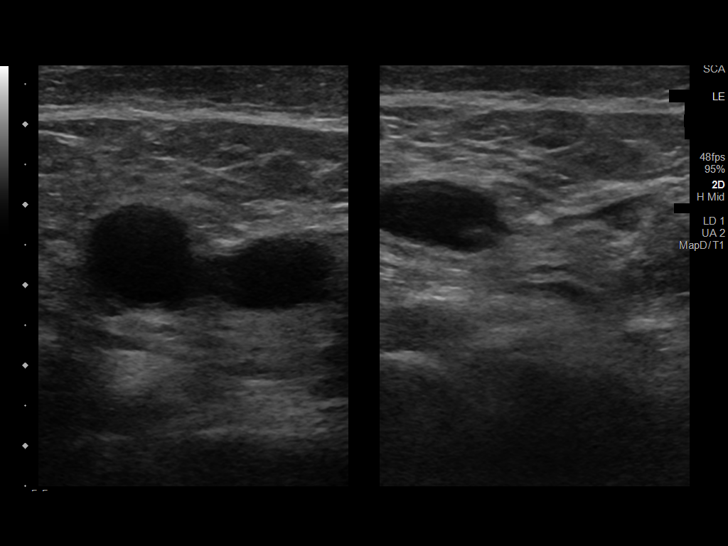
[im 3/33]
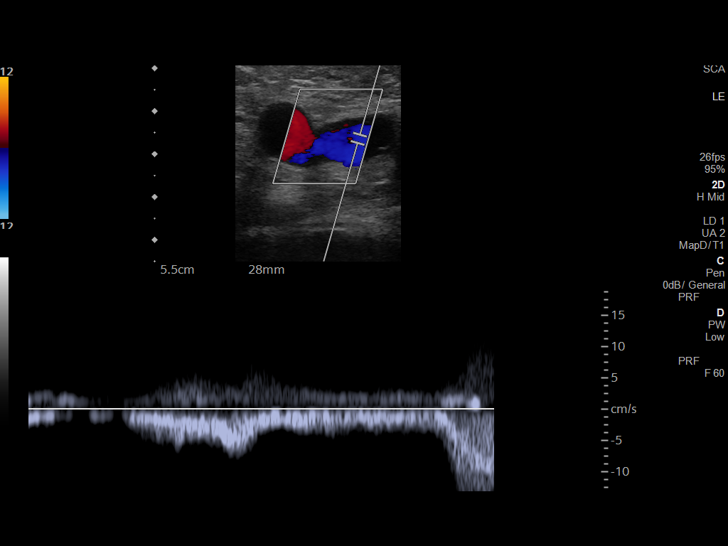
[im 6/33]
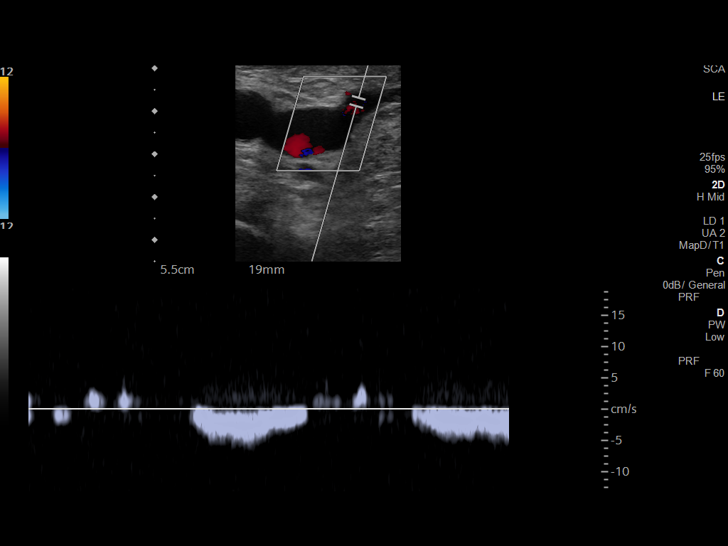
[im 9/33]
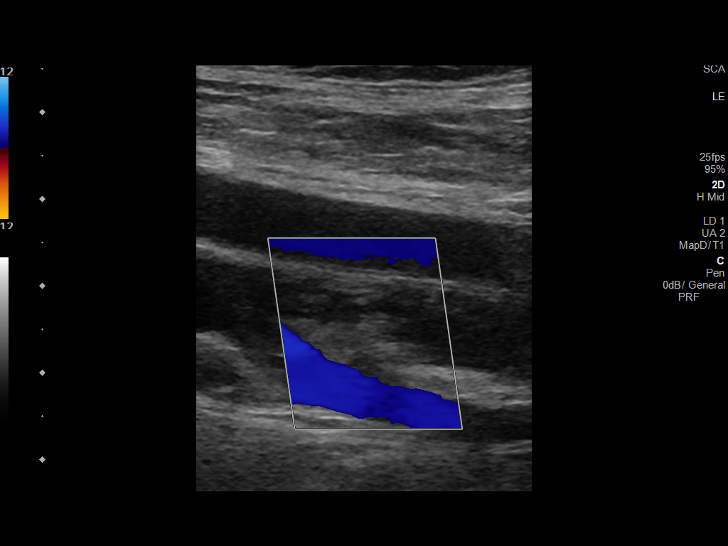
[im 12/33]
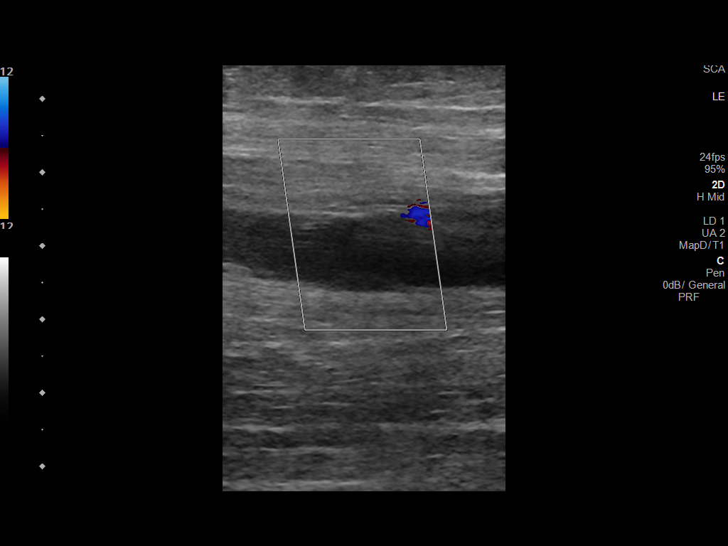
[im 14/33]
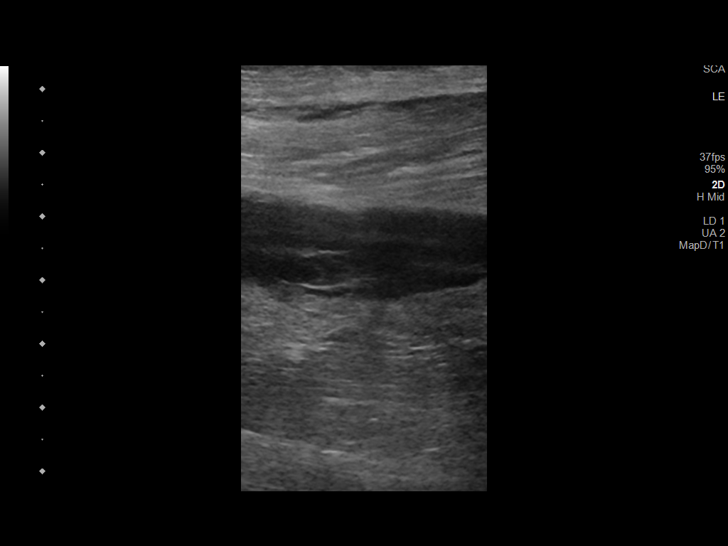
[im 17/33]
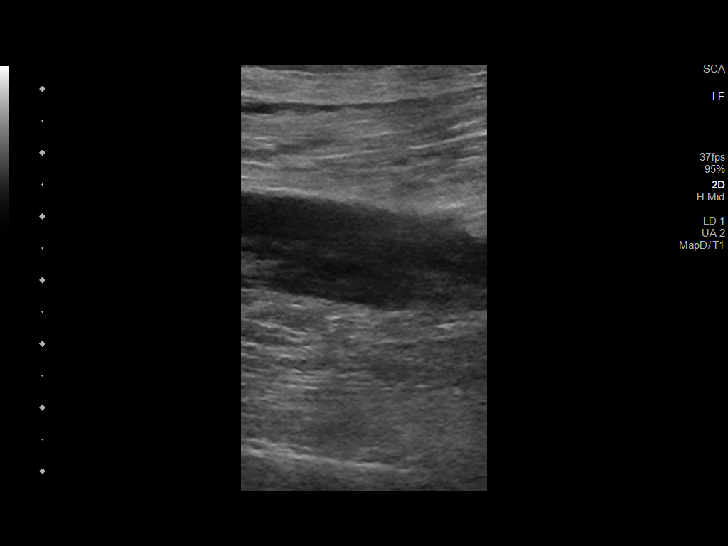
[im 19/33]
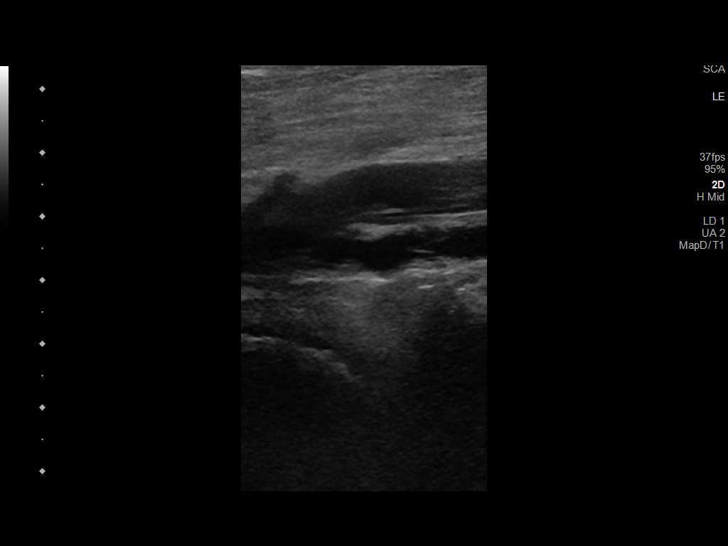
[im 21/33]
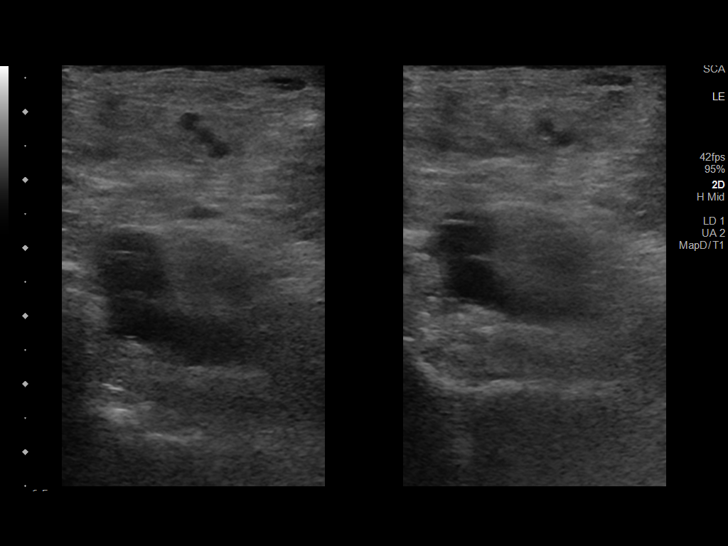
[im 24/33]
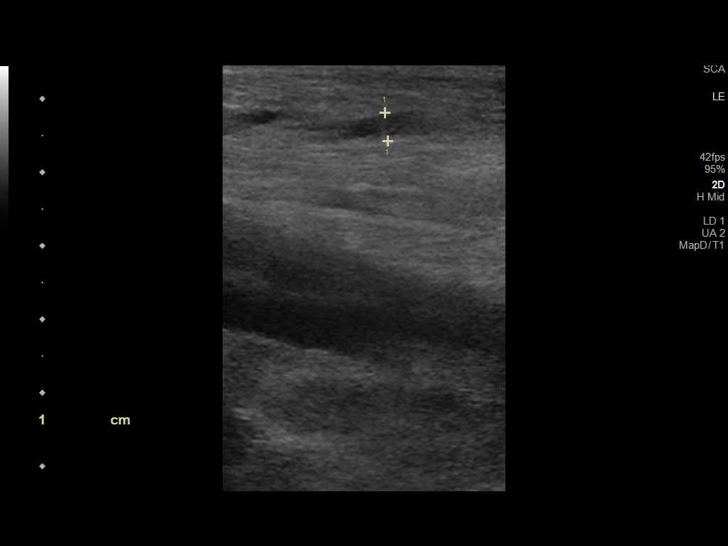
[im 27/33]
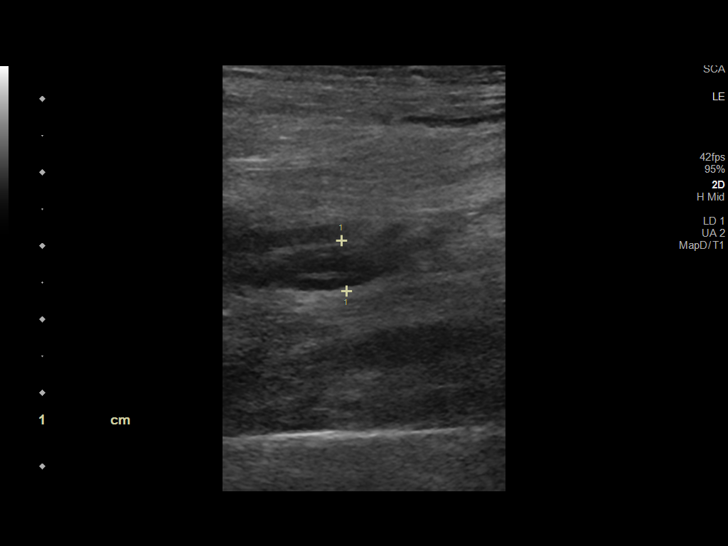
[im 30/33]
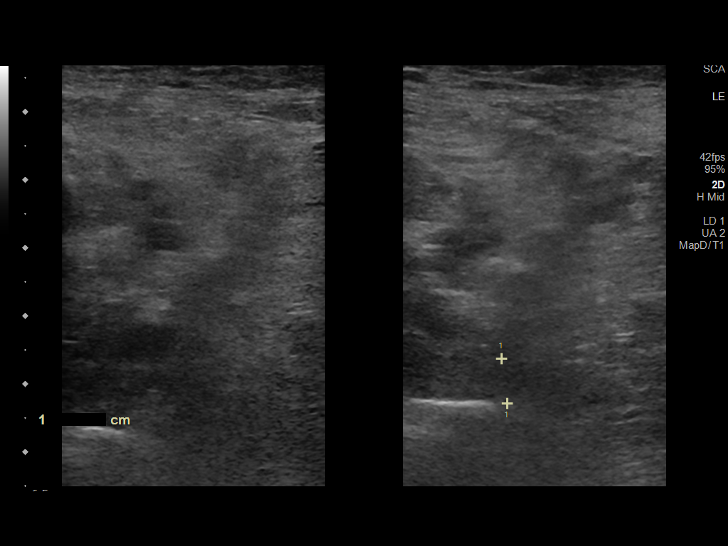
[im 33/33]
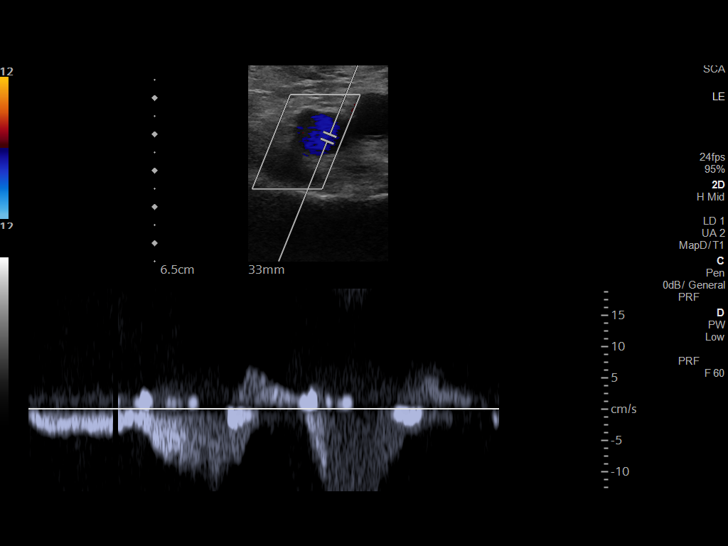

[13 of 24 positions shown; findings below may reference images not displayed]

FINDINGS: Contralateral Common Femoral Vein: Respiratory phasicity is normal
and symmetric with the symptomatic side. No evidence of thrombus.
Normal compressibility.

Common Femoral Vein: No evidence of thrombus. Normal
compressibility, respiratory phasicity and response to augmentation.

Saphenofemoral Junction: No evidence of thrombus. Normal
compressibility and flow on color Doppler imaging.

Profunda Femoral Vein: No evidence of thrombus. Normal
compressibility and flow on color Doppler imaging.

Femoral Vein: There is hypoechoic occlusive thrombus involving the
proximal (image 80 and 9), mid (images 12 and 13) and distal (images
14 and 15) aspects of right femoral vein.

Popliteal Vein: There is hypoechoic occlusive thrombus involving the
right popliteal vein (images 19 and 21).

Calf Veins: There is hypoechoic occlusive thrombus involving right
posterior tibial (image 28) and peroneal (image 30) veins.

Superficial Great Saphenous Vein: No evidence of thrombus. Normal
compressibility.

Venous Reflux:  None.

Other Findings: There is hypoechoic occlusive thrombus involving the
right gastrocnemius vein (images 22 through 25).
IMPRESSION: 1. The examination is positive for occlusive DVT extending from the
proximal aspect of the right femoral vein through the imaged right
tibial veins.
2. Examination is also positive for occlusive superficial
thrombophlebitis involving the right gastrocnemius vein.

## 2020-05-28 MED ORDER — SODIUM CHLORIDE 0.9 % IV SOLN
Freq: Once | INTRAVENOUS | Status: AC
  Filled 2020-05-28: qty 250

## 2020-05-28 MED ORDER — HEPARIN SOD (PORK) LOCK FLUSH 100 UNIT/ML IV SOLN
500.0000 [IU] | Freq: Once | INTRAVENOUS | Status: AC | PRN
  Administered 2020-05-28: 500 [IU]
  Filled 2020-05-28: qty 5

## 2020-05-28 MED ORDER — SODIUM CHLORIDE 0.9% FLUSH
10.0000 mL | Freq: Once | INTRAVENOUS | Status: AC
  Administered 2020-05-28: 10 mL via INTRAVENOUS
  Filled 2020-05-28: qty 10

## 2020-05-28 MED ORDER — SODIUM CHLORIDE 0.9 % IV SOLN
800.0000 mg/m2 | Freq: Once | INTRAVENOUS | Status: AC
  Administered 2020-05-28: 1862 mg via INTRAVENOUS
  Filled 2020-05-28: qty 48.97

## 2020-05-28 MED ORDER — PROCHLORPERAZINE MALEATE 10 MG PO TABS
10.0000 mg | ORAL_TABLET | Freq: Once | ORAL | Status: AC
  Administered 2020-05-28: 10 mg via ORAL

## 2020-05-28 MED ORDER — LORAZEPAM 2 MG/ML IJ SOLN
1.0000 mg | Freq: Once | INTRAMUSCULAR | Status: AC
  Administered 2020-05-28: 1 mg via INTRAVENOUS

## 2020-05-28 MED ORDER — PROCHLORPERAZINE MALEATE 10 MG PO TABS
ORAL_TABLET | ORAL | Status: AC
  Filled 2020-05-28: qty 1

## 2020-05-28 MED ORDER — PACLITAXEL PROTEIN-BOUND CHEMO INJECTION 100 MG
100.0000 mg/m2 | Freq: Once | INTRAVENOUS | Status: AC
  Administered 2020-05-28: 225 mg via INTRAVENOUS
  Filled 2020-05-28: qty 45

## 2020-05-28 MED ORDER — ENOXAPARIN SODIUM 120 MG/0.8ML ~~LOC~~ SOLN: 100.0000 mg | Freq: Two times a day (BID) | SUBCUTANEOUS | 0 refills | Status: DC

## 2020-05-28 MED ORDER — SODIUM CHLORIDE 0.9% FLUSH
10.0000 mL | INTRAVENOUS | Status: DC | PRN
  Administered 2020-05-28: 10 mL
  Filled 2020-05-28: qty 10

## 2020-05-28 MED ORDER — LORAZEPAM 2 MG/ML IJ SOLN
INTRAMUSCULAR | Status: AC
  Filled 2020-05-28: qty 1

## 2020-05-28 NOTE — Progress Notes (Signed)
Per dr Marin Olp, give 1 mg ativan ivp now for n/v. done

## 2020-05-28 NOTE — Progress Notes (Signed)
Per Dr. Marin Olp, patient needs to start Lovenox 100 mg injections twice a day. Prescription sent to [facility]. Only have Lovenox 120 mg injections in stock. Pharmacy dispenses 10 syringes, five days worth. This nurse taught and demonstrated with the wife how to waste and administer the injection. Lovenox 100 mg is 0.67 mls in the syringe. She verbalized understanding of the demonstration. Pharmacy to fill the remainder of the prescription next week. She knows to come to pharmacy and pick up syringes.

## 2020-05-28 NOTE — Patient Instructions (Signed)
[facility] Discharge Instructions for Patients Receiving Chemotherapy  Today you received the following chemotherapy agents Abraxane and Gemzar  To help prevent nausea and vomiting after your treatment, we encourage you to take your nausea medication as prescribed by MD.   If you develop nausea and vomiting that is not controlled by your nausea medication, call the clinic.   BELOW ARE SYMPTOMS THAT SHOULD BE REPORTED IMMEDIATELY:  *FEVER GREATER THAN 100.5 F  *CHILLS WITH OR WITHOUT FEVER  NAUSEA AND VOMITING THAT IS NOT CONTROLLED WITH YOUR NAUSEA MEDICATION  *UNUSUAL SHORTNESS OF BREATH  *UNUSUAL BRUISING OR BLEEDING  TENDERNESS IN MOUTH AND THROAT WITH OR WITHOUT PRESENCE OF ULCERS  *URINARY PROBLEMS  *BOWEL PROBLEMS  UNUSUAL RASH Items with * indicate a potential emergency and should be followed up as soon as possible.  Feel free to call the clinic should you have any questions or concerns. The clinic phone number is [phone].  Please show the CHEMO ALERT CARD at check-in to the Emergency Department and triage nurse.   

## 2020-05-28 NOTE — Progress Notes (Signed)
Okay to treat with HR 110 per Dr. Ennever. 

## 2020-05-28 NOTE — Progress Notes (Signed)
Hematology and Oncology Follow Up Visit  Melvin [name] [id number] 20-Oct-1961 59 y.o. 05/28/2020   Principle Diagnosis:  Metastatic adenocarcinoma of the pancreas-liver, lung and lymph node metastasis - KRAS (+) Pulmonary embolism -- dx on 04/20/2020 RIGHT leg DVT -- femoral - peroneal -- dx 05/28/2020  Past Therapy: Status post cycle 2 of FOLFIRINOX -- d/c on 04/20/2020  Current Therapy:        Gemzar/Abraxane -- started 04/30/2020, s/p cycle 1 Xarelto 20 mg po q day -- started 04/20/2020 -- d/c on 05/28/2020 Lovenox 100 mg SQ BID -- start on 05/28/2020   Interim History:  Mr. Wessell is here today for treatment.  Now, the problem is that he has swelling in the right leg.  We went ahead and did a Doppler of his right leg.  To no surprise, there is a thrombus in the right leg.  It extends from the femoral down to the peroneal.  He is on Xarelto.  He is taking it daily.  He has been diligent with his.  I would have to think that this has worked for the pulmonary embolism since he does not have chest pain nor shortness of breath.  Obviously, the tumor is driving all of this.  We are going to have to get him on Lovenox now.  I know this is such a inconvenience.  However, this is probably going to be the most effective way to try to cause blood clot regression.  We will have him on Lovenox at 100 mg subcu twice daily.  I did get a chest sugar on him today.  He still has a widespread metastatic pancreatic cancer but it does not look worse.  I think that he actually looks a bit better than what he has before.  He has horrible anticipatory vomiting.  He had done well at home and then once he got to the office, he began throwing up.  I told him that he can take the Ativan 4-5 times a day if need be.  He says he is eating pretty good.  He has gained weight.  I do not think the weight gain is from water retention.  He has had some diarrhea but Imodium helps with this.  He has had no bleeding.   There is been no hemoptysis.  He has had no headaches.  Mr. Romagnoli is trying his best.  He is doing everything we asked him to do.  His wife is such a bonus for him.  She is incredibly diligent and is doing everything she can to help him.    Not unexpectedly, his blood sugars are quite high.  He really needs to increase the insulin that he is taking.  I told him to go up to 18 units of insulin 3 times a day.  He is on Metformin twice a day.  I told him to go down to once a day Metformin.  I told him to stop the Lipitor for right now.  Of note, his last CA 19-9 was 129,200.    Overall, his performance status is probably ECOG 1-2.   Medications:  Allergies as of 05/28/2020   No Known Allergies     Medication List       Accurate as of May 28, 2020  3:47 PM. If you have any questions, ask your nurse or doctor.        atorvastatin 10 MG tablet Commonly known as: LIPITOR Take 1 tablet (10 mg total) by mouth daily.  Creon 24000-76000 units Cpep Generic drug: Pancrelipase (Lip-Prot-Amyl) TAKE 3 CAPSULE BY MOUTH IN THE MORNING, NOON, AND AT NIGHT AT BEDTIME What changed: See the new instructions. Changed by: Volanda Napoleon, MD   dexamethasone 4 MG tablet Commonly known as: DECADRON Take 4 mg by mouth 2 (two) times daily with a meal. Takes 2 tabs PO daily, start day after chemo x 3 days with food.   dronabinol 5 MG capsule Commonly known as: MARINOL Take 1 capsule (5 mg total) by mouth 2 (two) times daily before lunch and supper.   enoxaparin 120 MG/0.8ML injection Commonly known as: LOVENOX Inject 0.67 mLs (100 mg total) into the skin every 12 (twelve) hours. Started by: Amelia Jo, RN   fentaNYL 12 MCG/HR Commonly known as: [place] 1 patch onto the skin every 3 (three) days.   fluconazole 100 MG tablet Commonly known as: DIFLUCAN Take 1 tablet (100 mg total) by mouth daily.   gabapentin 300 MG capsule Commonly known as: NEURONTIN Take 1 capsule (300 mg  total) by mouth 3 (three) times daily.   HYDROcodone-acetaminophen 5-325 MG tablet Commonly known as: NORCO/VICODIN Take 1-2 tablets by mouth every 6 (six) hours as needed for moderate pain or severe pain.   HYDROcodone-homatropine 5-1.5 MG/5ML syrup Commonly known as: Hycodan Take 5 mLs by mouth every 6 (six) hours as needed for cough.   lidocaine-prilocaine cream Commonly known as: EMLA Apply 1 application topically as needed. Use on portacath as directed approx 1-2 hours prior to chemotherapy   loperamide 2 MG capsule Commonly known as: Imodium A-D Take 1 capsule (2 mg total) by mouth as needed for diarrhea or loose stools. Take 2 tablets at onset of diarrhea, then 1 every 2 hours until 12 hours without a BM.   LORazepam 0.5 MG tablet Commonly known as: ATIVAN TAKE 1 TABLET BY MOUTH EVERY 8 HOURS AS NEEDED FOR NAUSEA What changed:   when to take this  reasons to take this  additional instructions Changed by: Volanda Napoleon, MD   losartan 25 MG tablet Commonly known as: COZAAR Take 1 tablet (25 mg total) by mouth daily.   metFORMIN 1000 MG tablet Commonly known as: GLUCOPHAGE Take 1 tablet (1,000 mg total) by mouth 2 (two) times daily with a meal.   metoCLOPramide 10 MG tablet Commonly known as: REGLAN Take 0.5 tablets (5 mg total) by mouth as needed for nausea (nausea/reflux).   NovoLOG FlexPen 100 UNIT/ML FlexPen Generic drug: insulin aspart Inject 15 Units into the skin 3 (three) times daily with meals. What changed: how much to take   ondansetron 8 MG disintegrating tablet Commonly known as: ZOFRAN-ODT Take 1 tablet (8 mg total) by mouth every 8 (eight) hours as needed for nausea.   ondansetron 8 MG tablet Commonly known as: ZOFRAN Take 1 tablet (8 mg total) by mouth 2 (two) times daily. As needed.  Start on Day 3 after chemotherapy.   Pen Needles 30G X 8 MM Misc 1 each by Does not apply route as directed.   prochlorperazine 10 MG tablet Commonly  known as: COMPAZINE Take 1 tablet (10 mg total) by mouth every 6 (six) hours as needed for nausea or vomiting.   rivaroxaban 20 MG Tabs tablet Commonly known as: XARELTO Take 1 tablet (20 mg total) by mouth daily with supper.       Allergies: No Known Allergies  Past Medical History, Surgical history, Social history, and Family History were reviewed and updated.  Review of Systems:  Review of Systems  Constitutional: Positive for malaise/fatigue.  HENT: Negative.   Eyes: Negative.   Respiratory: Positive for cough.   Cardiovascular: Positive for leg swelling.  Gastrointestinal: Positive for diarrhea and vomiting.  Genitourinary: Negative.   Musculoskeletal: Positive for myalgias.  Neurological: Positive for tingling.  Endo/Heme/Allergies: Negative.   Psychiatric/Behavioral: Negative.      Physical Exam:  height is '6\' 3"'$  (1.905 m) and weight is 231 lb 1.9 oz (104.8 kg). His temporal temperature is 96.9 F (36.1 C) (abnormal). His blood pressure is 121/82 and his pulse is 110 (abnormal). His respiration is 18 and oxygen saturation is 99%.   Wt Readings from Last 3 Encounters:  05/28/20 231 lb 1.9 oz (104.8 kg)  05/28/20 231 lb 1.9 oz (104.8 kg)  05/07/20 223 lb (101.2 kg)    Ocular: Sclerae unicteric, pupils equal, round and reactive to light Ear-nose-throat: Oropharynx clear, dentition fair Lymphatic: No cervical or supraclavicular adenopathy Lungs no rales or rhonchi, good excursion bilaterally Heart regular rate and rhythm, no murmur appreciated Abd soft, nontender, positive bowel sounds, no liver or spleen tip palpated on exam, no fluid wave  MSK no focal spinal tenderness, no joint edema Neuro: non-focal, well-oriented, appropriate affect Breasts: Deferred   Lab Results  Component Value Date   WBC 7.0 05/28/2020   HGB 11.3 (L) 05/28/2020   HCT 36.5 (L) 05/28/2020   MCV 97.6 05/28/2020   PLT 226 05/28/2020   No results found for: FERRITIN, IRON, TIBC, UIBC,  IRONPCTSAT Lab Results  Component Value Date   RBC 3.74 (L) 05/28/2020   No results found for: KPAFRELGTCHN, LAMBDASER, KAPLAMBRATIO No results found for: IGGSERUM, IGA, IGMSERUM No results found for: Ronnald Ramp, A1GS, A2GS, Tillman Sers, SPEI   Chemistry      Component Value Date/Time   NA 142 05/28/2020 0950   K 3.4 (L) 05/28/2020 0950   CL 102 05/28/2020 0950   CO2 29 05/28/2020 0950   BUN 9 05/28/2020 0950   CREATININE 0.55 (L) 05/28/2020 0950   CREATININE 0.75 03/08/2020 1058      Component Value Date/Time   CALCIUM 8.3 (L) 05/28/2020 0950   ALKPHOS 707 (H) 05/28/2020 0950   AST 51 (H) 05/28/2020 0950   ALT 62 (H) 05/28/2020 0950   BILITOT 1.2 05/28/2020 0950       Impression and Plan: Ms. Goth is a very pleasant 60 yo caucasian gentleman with metastatic adenocarcinoma of the pancreas with extensive disease involving the liver, lungs and lymph nodes.   I must say that we have had such a tough time with Mr.Kothari.  Again he is doing everything we asked him to do.  He just has a very difficult tumor.  Part of the problem is that the tumor, on molecular analysis, has the K-ras mutation.  This could certainly make the tumor inherently chemo resistant.  I know that the FDA just approved a new agent for lung cancer that has the K-ras mutation.  I am not sure if we can get this for any other cancer.  We now have to deal with this thrombus in the right leg.  We we will have him on Lovenox.  He will take Lovenox for the long-term.  Hopefully, we can see a response.  We will have to see what his CA 19-9 is.  We will have to get him back in another week or so.  I just want to make sure we maintain close follow-up with him.  We literally  took 60 minutes with him today.  There are so many issues that we had to deal with and the most important being the blood clot in his right leg.  Volanda Napoleon, MD 6/11/20213:47 PM

## 2020-05-28 NOTE — Patient Instructions (Signed)

## 2020-05-30 LAB — CANCER ANTIGEN 19-9: CA 19-9: 102050 U/mL — ABNORMAL HIGH (ref 0–35)

## 2020-05-31 ENCOUNTER — Encounter: Payer: Self-pay | Admitting: *Deleted

## 2020-05-31 ENCOUNTER — Other Ambulatory Visit: Payer: Self-pay | Admitting: Hematology & Oncology

## 2020-05-31 NOTE — Progress Notes (Signed)
Oncology Nurse Navigator Documentation  Oncology Nurse Navigator Flowsheets 05/31/2020  Abnormal Finding Date -  Diagnosis Status -  Planned Course of Treatment -  Phase of Treatment -  Chemotherapy Actual Start Date: -  Navigator Follow Up Date: 06/04/2020  Navigator Follow Up Reason: Chemotherapy  Navigator Location [facility]  Referral Date to RadOnc/MedOnc -  Navigator Encounter Type Appt/Treatment Plan Review  Telephone -  Treatment Initiated Date -  Patient Visit Type MedOnc  Treatment Phase Active Tx  Barriers/Navigation Needs -  Education -  Interventions None Required  Acuity Level 2-Minimal Needs (1-2 Barriers Identified)  Referrals -  Coordination of Care -  Education Method -  Support Groups/Services Friends and Family  Time Spent with Patient 15

## 2020-06-02 ENCOUNTER — Other Ambulatory Visit: Payer: Self-pay | Admitting: *Deleted

## 2020-06-02 ENCOUNTER — Emergency Department (HOSPITAL_COMMUNITY): Payer: PRIVATE HEALTH INSURANCE

## 2020-06-02 ENCOUNTER — Inpatient Hospital Stay (HOSPITAL_BASED_OUTPATIENT_CLINIC_OR_DEPARTMENT_OTHER): Payer: PRIVATE HEALTH INSURANCE | Admitting: Hematology & Oncology

## 2020-06-02 ENCOUNTER — Inpatient Hospital Stay (HOSPITAL_COMMUNITY)
Admission: EM | Admit: 2020-06-02 | Discharge: 2020-06-06 | DRG: 542 | Disposition: A | Discharge status: Home / Self Care | Payer: PRIVATE HEALTH INSURANCE | Attending: Family Medicine | Admitting: Family Medicine

## 2020-06-02 ENCOUNTER — Encounter (HOSPITAL_COMMUNITY): Payer: Self-pay | Admitting: Emergency Medicine

## 2020-06-02 ENCOUNTER — Other Ambulatory Visit: Payer: Self-pay

## 2020-06-02 ENCOUNTER — Inpatient Hospital Stay: Payer: PRIVATE HEALTH INSURANCE

## 2020-06-02 ENCOUNTER — Encounter: Payer: Self-pay | Admitting: Hematology & Oncology

## 2020-06-02 ENCOUNTER — Telehealth: Payer: Self-pay | Admitting: *Deleted

## 2020-06-02 ENCOUNTER — Encounter: Payer: Self-pay | Admitting: *Deleted

## 2020-06-02 VITALS — BP 135/92 | HR 68 | Temp 97.3°F | Resp 18

## 2020-06-02 VITALS — BP 129/100 | HR 92 | Resp 18

## 2020-06-02 DIAGNOSIS — E860 Dehydration: Secondary | ICD-10-CM

## 2020-06-02 DIAGNOSIS — R7989 Other specified abnormal findings of blood chemistry: Secondary | ICD-10-CM | POA: Diagnosis present

## 2020-06-02 DIAGNOSIS — R296 Repeated falls: Secondary | ICD-10-CM | POA: Diagnosis present

## 2020-06-02 DIAGNOSIS — E7800 Pure hypercholesterolemia, unspecified: Secondary | ICD-10-CM | POA: Diagnosis present

## 2020-06-02 DIAGNOSIS — W19XXXA Unspecified fall, initial encounter: Secondary | ICD-10-CM

## 2020-06-02 DIAGNOSIS — C787 Secondary malignant neoplasm of liver and intrahepatic bile duct: Secondary | ICD-10-CM | POA: Diagnosis present

## 2020-06-02 DIAGNOSIS — C7800 Secondary malignant neoplasm of unspecified lung: Secondary | ICD-10-CM | POA: Diagnosis present

## 2020-06-02 DIAGNOSIS — I82411 Acute embolism and thrombosis of right femoral vein: Secondary | ICD-10-CM

## 2020-06-02 DIAGNOSIS — Z5111 Encounter for antineoplastic chemotherapy: Secondary | ICD-10-CM | POA: Diagnosis not present

## 2020-06-02 DIAGNOSIS — I82441 Acute embolism and thrombosis of right tibial vein: Secondary | ICD-10-CM | POA: Diagnosis present

## 2020-06-02 DIAGNOSIS — I82461 Acute embolism and thrombosis of right calf muscular vein: Secondary | ICD-10-CM | POA: Diagnosis present

## 2020-06-02 DIAGNOSIS — C7951 Secondary malignant neoplasm of bone: Principal | ICD-10-CM | POA: Diagnosis present

## 2020-06-02 DIAGNOSIS — R29898 Other symptoms and signs involving the musculoskeletal system: Secondary | ICD-10-CM | POA: Diagnosis not present

## 2020-06-02 DIAGNOSIS — C259 Malignant neoplasm of pancreas, unspecified: Secondary | ICD-10-CM

## 2020-06-02 DIAGNOSIS — I472 Ventricular tachycardia: Secondary | ICD-10-CM | POA: Diagnosis present

## 2020-06-02 DIAGNOSIS — I809 Phlebitis and thrombophlebitis of unspecified site: Secondary | ICD-10-CM | POA: Diagnosis present

## 2020-06-02 DIAGNOSIS — Z95828 Presence of other vascular implants and grafts: Secondary | ICD-10-CM

## 2020-06-02 DIAGNOSIS — Z79899 Other long term (current) drug therapy: Secondary | ICD-10-CM

## 2020-06-02 DIAGNOSIS — Z794 Long term (current) use of insulin: Secondary | ICD-10-CM

## 2020-06-02 DIAGNOSIS — Z7901 Long term (current) use of anticoagulants: Secondary | ICD-10-CM

## 2020-06-02 DIAGNOSIS — E876 Hypokalemia: Secondary | ICD-10-CM | POA: Diagnosis not present

## 2020-06-02 DIAGNOSIS — Z20822 Contact with and (suspected) exposure to covid-19: Secondary | ICD-10-CM | POA: Diagnosis present

## 2020-06-02 DIAGNOSIS — G8311 Monoplegia of lower limb affecting right dominant side: Secondary | ICD-10-CM | POA: Diagnosis present

## 2020-06-02 DIAGNOSIS — I671 Cerebral aneurysm, nonruptured: Secondary | ICD-10-CM | POA: Diagnosis present

## 2020-06-02 DIAGNOSIS — D649 Anemia, unspecified: Secondary | ICD-10-CM | POA: Diagnosis present

## 2020-06-02 DIAGNOSIS — S7001XA Contusion of right hip, initial encounter: Secondary | ICD-10-CM | POA: Diagnosis present

## 2020-06-02 DIAGNOSIS — I471 Supraventricular tachycardia, unspecified: Secondary | ICD-10-CM

## 2020-06-02 DIAGNOSIS — Y93E1 Activity, personal bathing and showering: Secondary | ICD-10-CM

## 2020-06-02 DIAGNOSIS — Z87891 Personal history of nicotine dependence: Secondary | ICD-10-CM

## 2020-06-02 DIAGNOSIS — E1142 Type 2 diabetes mellitus with diabetic polyneuropathy: Secondary | ICD-10-CM | POA: Diagnosis present

## 2020-06-02 DIAGNOSIS — Z9181 History of falling: Secondary | ICD-10-CM

## 2020-06-02 DIAGNOSIS — I10 Essential (primary) hypertension: Secondary | ICD-10-CM | POA: Diagnosis present

## 2020-06-02 DIAGNOSIS — Z7952 Long term (current) use of systemic steroids: Secondary | ICD-10-CM

## 2020-06-02 DIAGNOSIS — Z7902 Long term (current) use of antithrombotics/antiplatelets: Secondary | ICD-10-CM

## 2020-06-02 DIAGNOSIS — G8314 Monoplegia of lower limb affecting left nondominant side: Secondary | ICD-10-CM | POA: Diagnosis present

## 2020-06-02 DIAGNOSIS — E785 Hyperlipidemia, unspecified: Secondary | ICD-10-CM | POA: Diagnosis present

## 2020-06-02 DIAGNOSIS — Z86718 Personal history of other venous thrombosis and embolism: Secondary | ICD-10-CM

## 2020-06-02 DIAGNOSIS — I493 Ventricular premature depolarization: Secondary | ICD-10-CM | POA: Diagnosis present

## 2020-06-02 DIAGNOSIS — W182XXA Fall in (into) shower or empty bathtub, initial encounter: Secondary | ICD-10-CM | POA: Diagnosis present

## 2020-06-02 DIAGNOSIS — Z66 Do not resuscitate: Secondary | ICD-10-CM | POA: Diagnosis not present

## 2020-06-02 DIAGNOSIS — C779 Secondary and unspecified malignant neoplasm of lymph node, unspecified: Secondary | ICD-10-CM | POA: Diagnosis present

## 2020-06-02 DIAGNOSIS — I2699 Other pulmonary embolism without acute cor pulmonale: Secondary | ICD-10-CM | POA: Diagnosis present

## 2020-06-02 LAB — I-STAT CHEM 8, ED
BUN: 10 mg/dL (ref 6–20)
Calcium, Ion: 1.11 mmol/L — ABNORMAL LOW (ref 1.15–1.40)
Chloride: 100 mmol/L (ref 98–111)
Creatinine, Ser: 0.4 mg/dL — ABNORMAL LOW (ref 0.61–1.24)
Glucose, Bld: 229 mg/dL — ABNORMAL HIGH (ref 70–99)
HCT: 32 % — ABNORMAL LOW (ref 39.0–52.0)
Hemoglobin: 10.9 g/dL — ABNORMAL LOW (ref 13.0–17.0)
Potassium: 2.7 mmol/L — CL (ref 3.5–5.1)
Sodium: 144 mmol/L (ref 135–145)
TCO2: 31 mmol/L (ref 22–32)

## 2020-06-02 LAB — CBC WITH DIFFERENTIAL/PLATELET
Abs Immature Granulocytes: 0.16 10*3/uL — ABNORMAL HIGH (ref 0.00–0.07)
Basophils Absolute: 0 10*3/uL (ref 0.0–0.1)
Basophils Relative: 0 %
Eosinophils Absolute: 0.1 10*3/uL (ref 0.0–0.5)
Eosinophils Relative: 2 %
HCT: 35.1 % — ABNORMAL LOW (ref 39.0–52.0)
Hemoglobin: 10.9 g/dL — ABNORMAL LOW (ref 13.0–17.0)
Immature Granulocytes: 4 %
Lymphocytes Relative: 29 %
Lymphs Abs: 1.2 10*3/uL (ref 0.7–4.0)
MCH: 30.6 pg (ref 26.0–34.0)
MCHC: 31.1 g/dL (ref 30.0–36.0)
MCV: 98.6 fL (ref 80.0–100.0)
Monocytes Absolute: 0.1 10*3/uL (ref 0.1–1.0)
Monocytes Relative: 3 %
Neutro Abs: 2.4 10*3/uL (ref 1.7–7.7)
Neutrophils Relative %: 62 %
Platelets: 197 10*3/uL (ref 150–400)
RBC: 3.56 MIL/uL — ABNORMAL LOW (ref 4.22–5.81)
RDW: 18 % — ABNORMAL HIGH (ref 11.5–15.5)
WBC: 3.9 10*3/uL — ABNORMAL LOW (ref 4.0–10.5)
nRBC: 0 % (ref 0.0–0.2)

## 2020-06-02 LAB — BASIC METABOLIC PANEL - CANCER CENTER ONLY
Anion gap: 8 (ref 5–15)
BUN: 12 mg/dL (ref 6–20)
CO2: 33 mmol/L — ABNORMAL HIGH (ref 22–32)
Calcium: 8.4 mg/dL — ABNORMAL LOW (ref 8.9–10.3)
Chloride: 101 mmol/L (ref 98–111)
Creatinine: 0.6 mg/dL — ABNORMAL LOW (ref 0.61–1.24)
GFR, Est AFR Am: >60 mL/min (ref >60)
GFR, Estimated: >60 mL/min (ref >60)
Glucose, Bld: 397 mg/dL — ABNORMAL HIGH (ref 70–99)
Potassium: 3.2 mmol/L — ABNORMAL LOW (ref 3.5–5.1)
Sodium: 142 mmol/L (ref 135–145)

## 2020-06-02 LAB — URINALYSIS, COMPLETE (UACMP) WITH MICROSCOPIC
Bilirubin Urine: NEGATIVE
Glucose, UA: >=500 mg/dL — AB
Hgb urine dipstick: NEGATIVE
Ketones, ur: NEGATIVE mg/dL
Leukocytes,Ua: NEGATIVE
Nitrite: NEGATIVE
Protein, ur: NEGATIVE mg/dL
Specific Gravity, Urine: 1.025 (ref 1.005–1.030)
pH: 6 (ref 5.0–8.0)

## 2020-06-02 LAB — CBC WITH DIFFERENTIAL (CANCER CENTER ONLY)
Abs Immature Granulocytes: 0.12 10*3/uL — ABNORMAL HIGH (ref 0.00–0.07)
Basophils Absolute: 0 10*3/uL (ref 0.0–0.1)
Basophils Relative: 0 %
Eosinophils Absolute: 0.1 10*3/uL (ref 0.0–0.5)
Eosinophils Relative: 1 %
HCT: 36.2 % — ABNORMAL LOW (ref 39.0–52.0)
Hemoglobin: 10.9 g/dL — ABNORMAL LOW (ref 13.0–17.0)
Immature Granulocytes: 3 %
Lymphocytes Relative: 27 %
Lymphs Abs: 1.2 10*3/uL (ref 0.7–4.0)
MCH: 29.5 pg (ref 26.0–34.0)
MCHC: 30.1 g/dL (ref 30.0–36.0)
MCV: 97.8 fL (ref 80.0–100.0)
Monocytes Absolute: 0.1 10*3/uL (ref 0.1–1.0)
Monocytes Relative: 3 %
Neutro Abs: 2.9 10*3/uL (ref 1.7–7.7)
Neutrophils Relative %: 66 %
Platelet Count: 196 10*3/uL (ref 150–400)
RBC: 3.7 MIL/uL — ABNORMAL LOW (ref 4.22–5.81)
RDW: 17.8 % — ABNORMAL HIGH (ref 11.5–15.5)
WBC Count: 4.4 10*3/uL (ref 4.0–10.5)
nRBC: 0 % (ref 0.0–0.2)

## 2020-06-02 LAB — COMPREHENSIVE METABOLIC PANEL
ALT: 174 U/L — ABNORMAL HIGH (ref 0–44)
AST: 146 U/L — ABNORMAL HIGH (ref 15–41)
Albumin: 2.5 g/dL — ABNORMAL LOW (ref 3.5–5.0)
Alkaline Phosphatase: 522 U/L — ABNORMAL HIGH (ref 38–126)
Anion gap: 8 (ref 5–15)
BUN: 12 mg/dL (ref 6–20)
CO2: 30 mmol/L (ref 22–32)
Calcium: 7.8 mg/dL — ABNORMAL LOW (ref 8.9–10.3)
Chloride: 105 mmol/L (ref 98–111)
Creatinine, Ser: 0.47 mg/dL — ABNORMAL LOW (ref 0.61–1.24)
GFR calc Af Amer: >60 mL/min (ref >60)
GFR calc non Af Amer: >60 mL/min (ref >60)
Glucose, Bld: 256 mg/dL — ABNORMAL HIGH (ref 70–99)
Potassium: 2.9 mmol/L — ABNORMAL LOW (ref 3.5–5.1)
Sodium: 143 mmol/L (ref 135–145)
Total Bilirubin: 1 mg/dL (ref 0.3–1.2)
Total Protein: 5.7 g/dL — ABNORMAL LOW (ref 6.5–8.1)

## 2020-06-02 LAB — TROPONIN I (HIGH SENSITIVITY)
Troponin I (High Sensitivity): 9 ng/L (ref <18)
Troponin I (High Sensitivity): 8 ng/L (ref <18)

## 2020-06-02 LAB — PROTIME-INR
INR: 1.6 — ABNORMAL HIGH (ref 0.8–1.2)
Prothrombin Time: 18.6 seconds — ABNORMAL HIGH (ref 11.4–15.2)

## 2020-06-02 LAB — APTT: aPTT: 37 seconds — ABNORMAL HIGH (ref 24–36)

## 2020-06-02 LAB — SARS CORONAVIRUS 2 BY RT PCR (HOSPITAL ORDER, PERFORMED IN ~~LOC~~ HOSPITAL LAB): SARS Coronavirus 2: NEGATIVE

## 2020-06-02 LAB — LACTIC ACID, PLASMA
Lactic Acid, Venous: 3.3 mmol/L (ref 0.5–1.9)
Lactic Acid, Venous: 2.7 mmol/L (ref 0.5–1.9)

## 2020-06-02 LAB — LIPASE, BLOOD: Lipase: 17 U/L (ref 11–51)

## 2020-06-02 IMAGING — MR MR THORACIC SPINE W/O CM
1 of 2 series · 13 of 28 positions shown · non-contrast
Comparison: Chest radiographs [DATE] and chest CTA [DATE].

CLINICAL DATA: Mid back pain with bilateral leg weakness.
Metastatic pancreatic cancer.

EXAM:
MRI THORACIC SPINE WITHOUT CONTRAST (LIMITED)
TECHNIQUE: Coronal and sagittal localizing images the spine were obtained. The
patient was not able to complete the examination. No axial imaging
was performed. No intravenous contrast was administered.

[Series 16: T1 · sagittal · 4.0mm · 1.72mm/px · 13 of 13 slices shown]
[im 1/13]
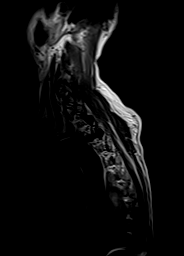
[im 2/13]
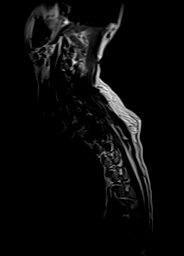
[im 3/13]
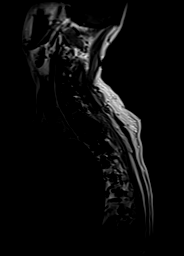
[im 4/13]
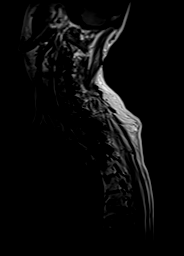
[im 5/13]
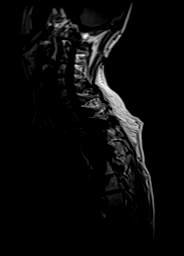
[im 6/13]
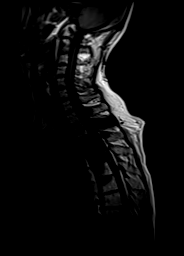
[im 7/13]
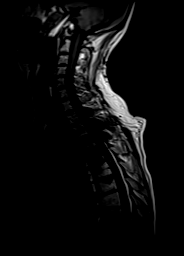
[im 8/13]
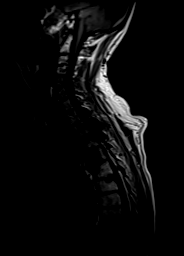
[im 9/13]
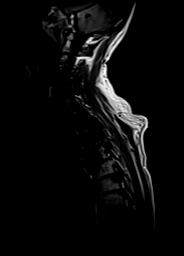
[im 10/13]
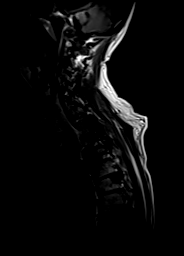
[im 11/13]
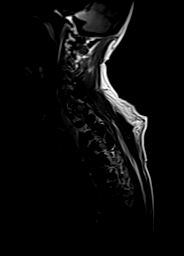
[im 12/13]
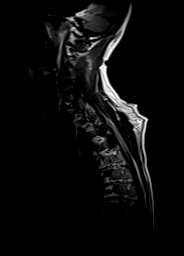
[im 13/13]
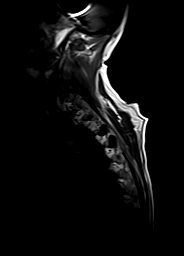

[13 of 28 positions shown; findings below may reference images not displayed]

FINDINGS: Alignment:  Normal.

Vertebrae: Widespread osseous metastatic disease. In the thoracic
spine, there are lesions within the T1, T2, T3, T4, T5 and T6
vertebral bodies. There is involvement of the posterior elements at
T1, T2 and T3. No significant pathologic fracture identified,
although a small amount of epidural tumor difficult to exclude at
T3. Metastases are also present within the L1 and L5 vertebral
bodies.

Cord:  No gross cord compression on localizing images.
IMPRESSION: 1. Extremely limited study terminated after the scout images were
obtained.
2. Widespread osseous metastatic disease throughout the thoracic
spine as described. No significant pathologic fracture identified,
although a small amount of epidural tumor difficult to exclude at
T3.
3. No gross cord compression on localizing images.
4. Consider further evaluation with CT or repeat MR after
appropriate sedation.

## 2020-06-02 IMAGING — CT CT L SPINE W/O CM
3 series · 13 of 33 positions shown, 16 images · non-contrast
Comparison: [DATE] CT abdomen

CLINICAL DATA: Back pain. Bilateral leg weakness, right greater
than left. Metastatic pancreatic cancer.

EXAM:
CT LUMBAR SPINE WITHOUT CONTRAST
TECHNIQUE: Multidetector CT imaging of the lumbar spine was performed without
intravenous contrast administration. Multiplanar CT image
reconstructions were also generated.

[Series 4: l spine st · axial · 0.41mm/px · z∈[-650,-448]mm · 5 of 147 slices shown, 7 images]
[im 23/147  soft-tissue]
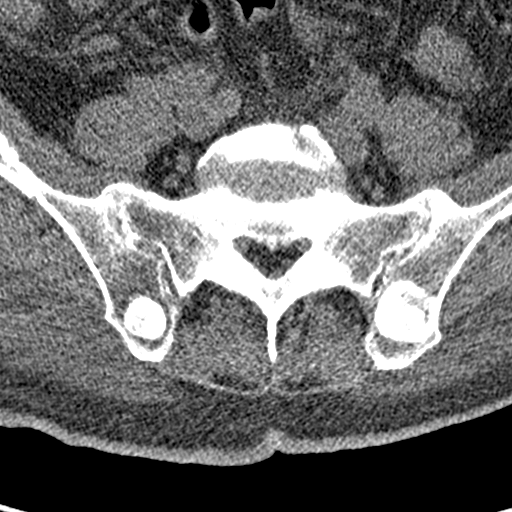
[im 23/147  bone]
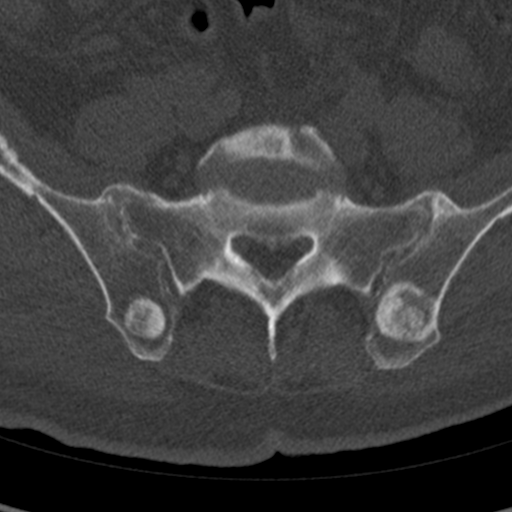
[im 45/147  bone]
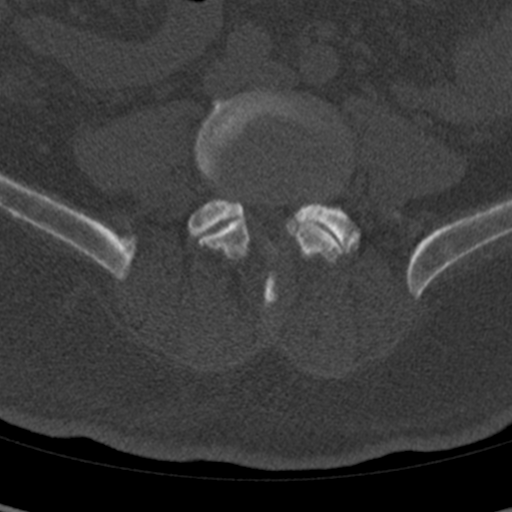
[im 79/147  bone]
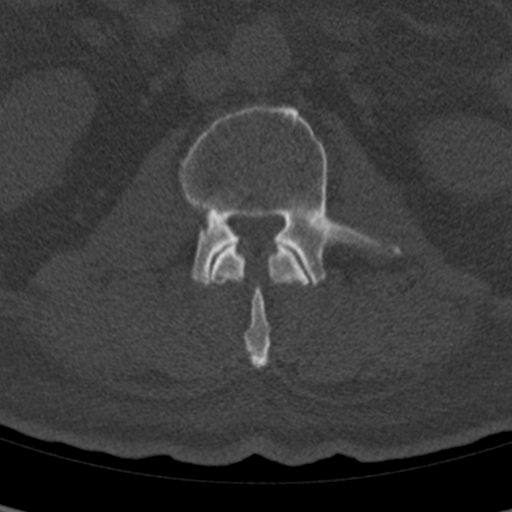
[im 102/147  bone]
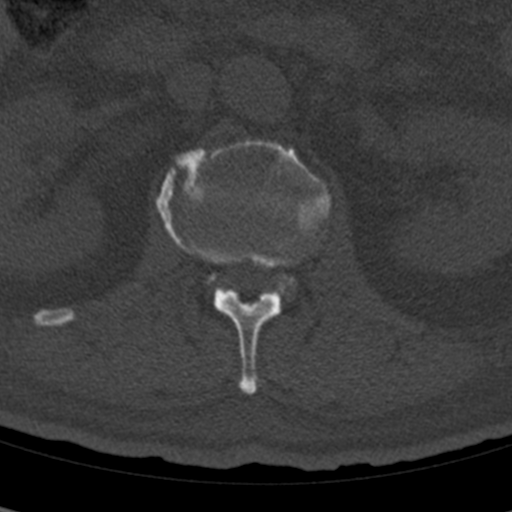
[im 124/147  soft-tissue]
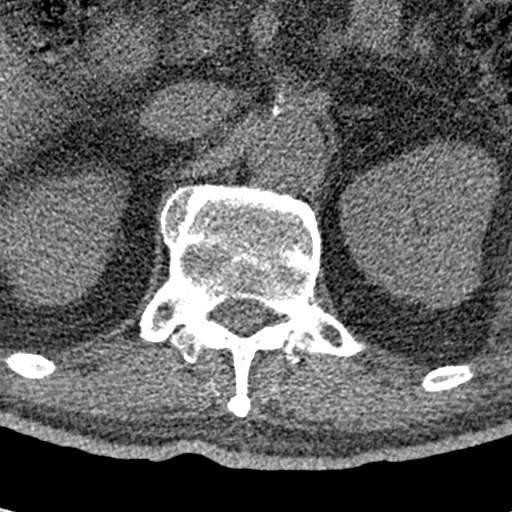
[im 124/147  bone]
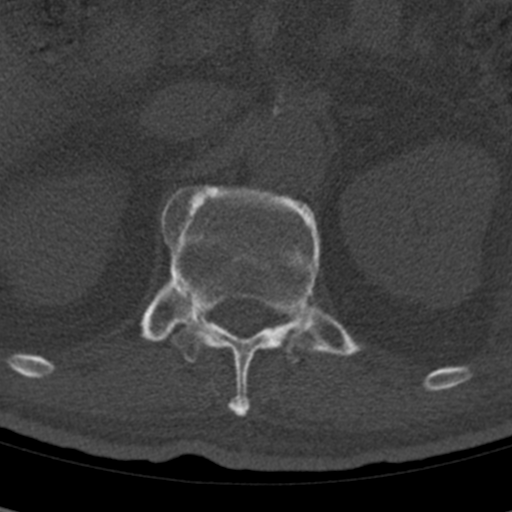

[Series 8: coronal bone · coronal · 0.43mm/px · 3 of 98 slices shown]
[im 20/98  bone]
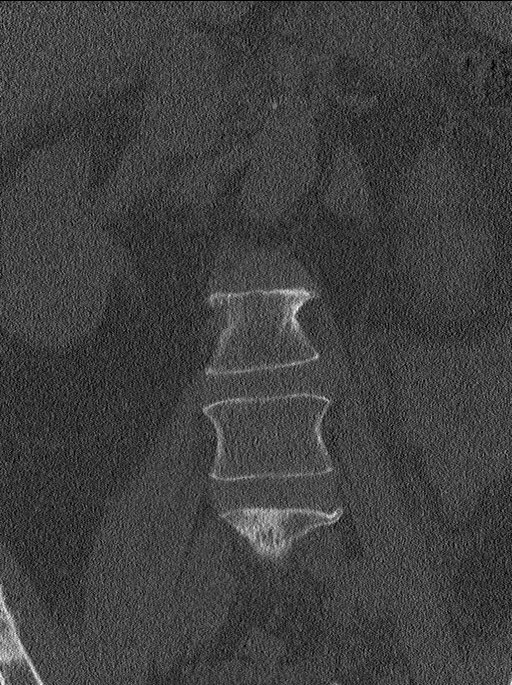
[im 39/98  bone]
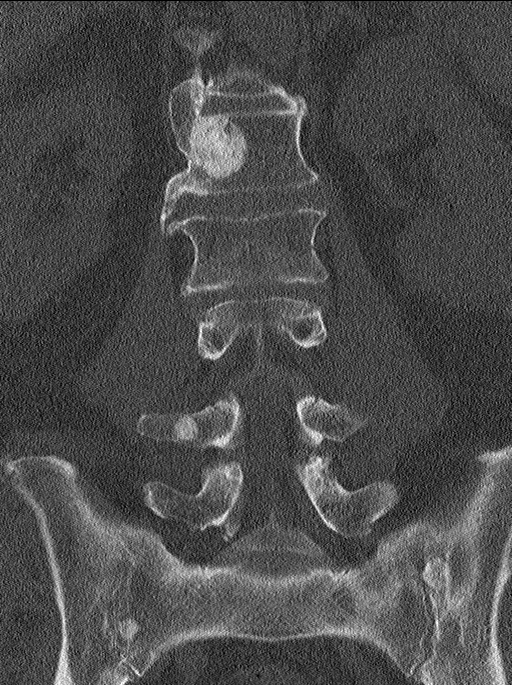
[im 59/98  bone]
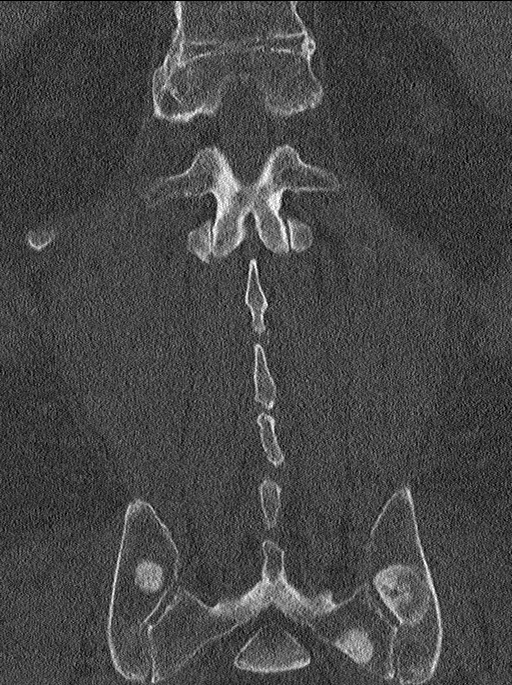

[Series 10: sagittal st · sagittal · 0.43mm/px · 5 of 72 slices shown, 6 images]
[im 24/72  bone]
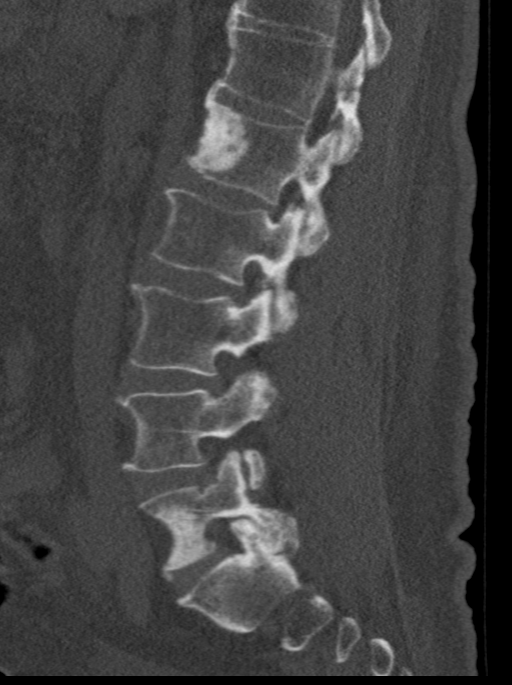
[im 30/72  bone]
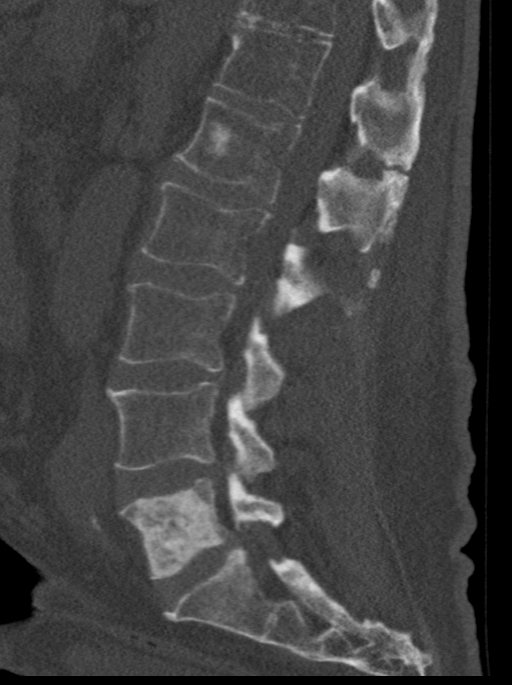
[im 36/72  soft-tissue]
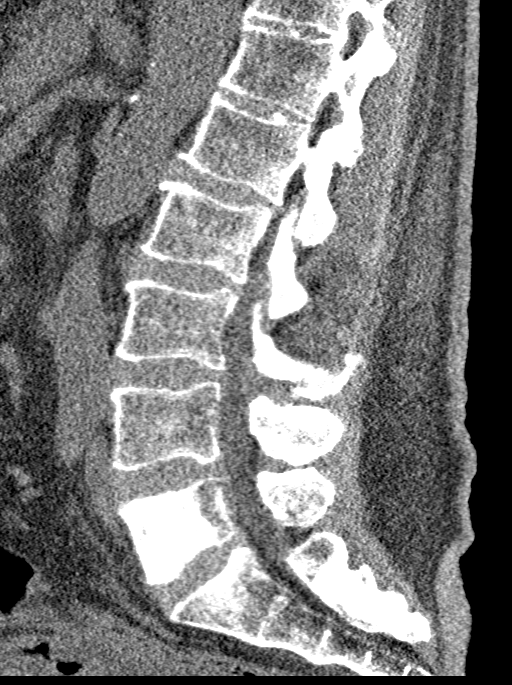
[im 36/72  bone]
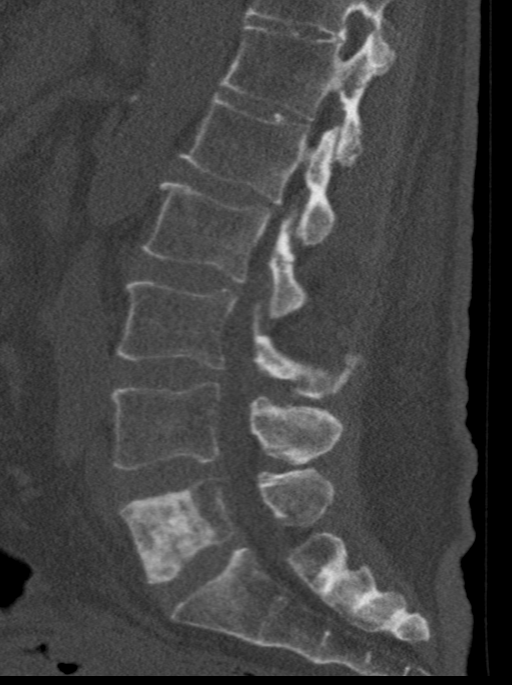
[im 42/72  bone]
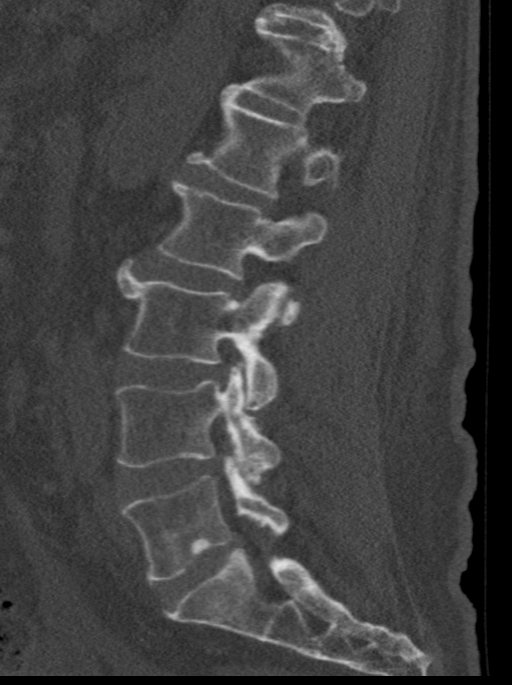
[im 48/72  bone]
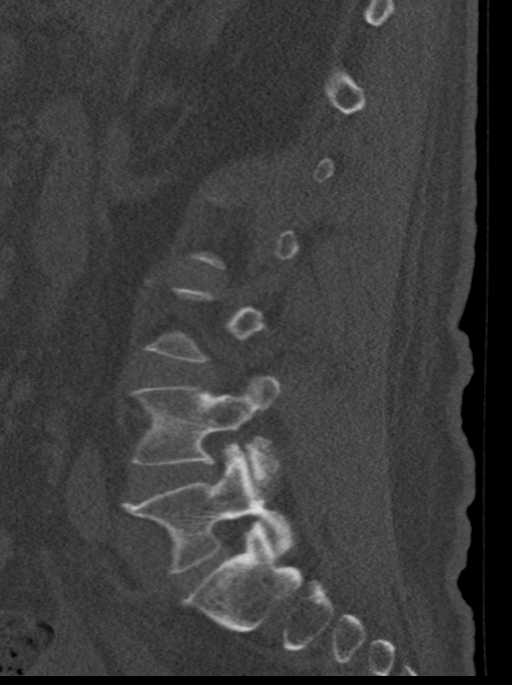

[13 of 33 positions shown; findings below may reference images not displayed]

FINDINGS: Segmentation: The lowest lumbar type non-rib-bearing vertebra is
labeled as L5.

Alignment: No vertebral subluxation is observed.

Vertebrae: Scattered new sclerotic metastatic lesions in the lumbar
spine and bony pelvis. These include a 2.4 by 2.3 by 2.8 cm
sclerotic lesion anteriorly in the L1 vertebral body; a 4.0 by
by 3.4 cm sclerotic lesion eccentric to the right in the L5
vertebral body, and multiple sclerotic lesions in the visualized
iliac bones and sacrum, as well as some other smaller vertebral
lesions.

No current fracture.  Bridging spurring anteriorly at T11-T12-L1.

Paraspinal and other soft tissues: Indistinct pancreatic tail
compatible with mass, abutting the spleen. Aortoiliac
atherosclerotic vascular disease.

Disc levels: Facet and uncinate spurring contribute to osseous
foraminal narrowing bilaterally at L4-5 and L5-S1, and potentially
on the left at L2-3 and L3-4 as well.
IMPRESSION: 1. New sclerotic metastatic lesions in the lumbar spine and bony
pelvis, without fracture or vertebral subluxation.
2. Indistinct pancreatic tail compatible with mass, abutting the
spleen.
3. Lumbar spondylosis and degenerative disc disease causing osseous
foraminal narrowing bilaterally at L4-5 and L5-S1, and potentially
on the left at L2-3 and L3-4 as well.
4. Aortic atherosclerosis.

Aortic Atherosclerosis ([SH]-[SH]).

## 2020-06-02 IMAGING — CT CT T SPINE W/O CM
3 series · 12 of 33 positions shown, 14 images · non-contrast
Comparison: MRI thoracic spine from [DATE]

CLINICAL DATA: Metastatic pancreatic adenocarcinoma. Back pain and
bilateral leg weakness.

EXAM:
CT THORACIC SPINE WITHOUT CONTRAST
TECHNIQUE: Multidetector CT images of the thoracic were obtained using the
standard protocol without intravenous contrast.

[Series 4: t spine st · axial · 0.47mm/px · z∈[-422,-156]mm · 4 of 193 slices shown, 5 images]
[im 30/193  soft-tissue]
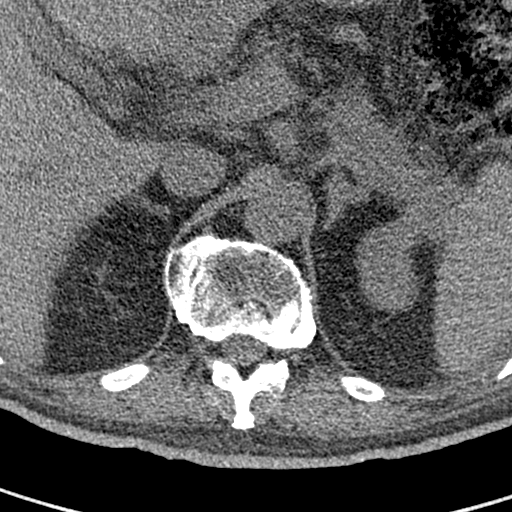
[im 30/193  bone]
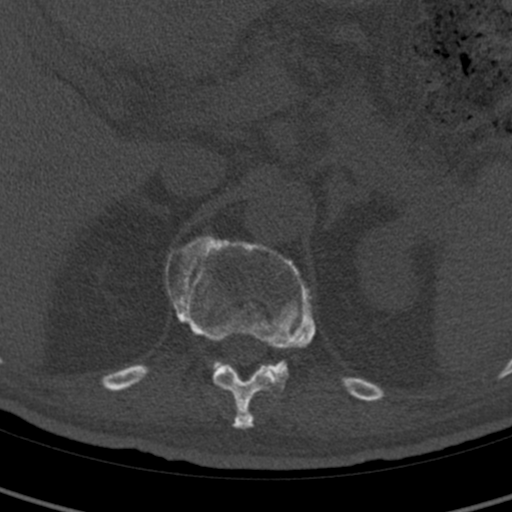
[im 74/193  bone]
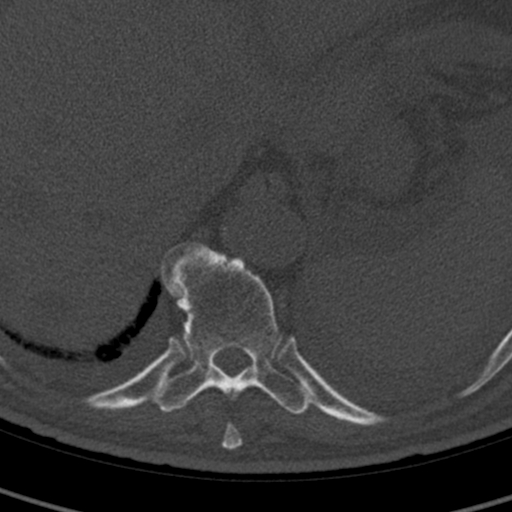
[im 119/193  bone]
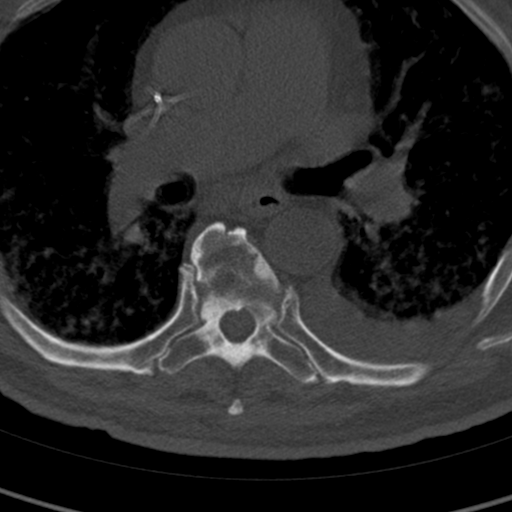
[im 163/193  bone]
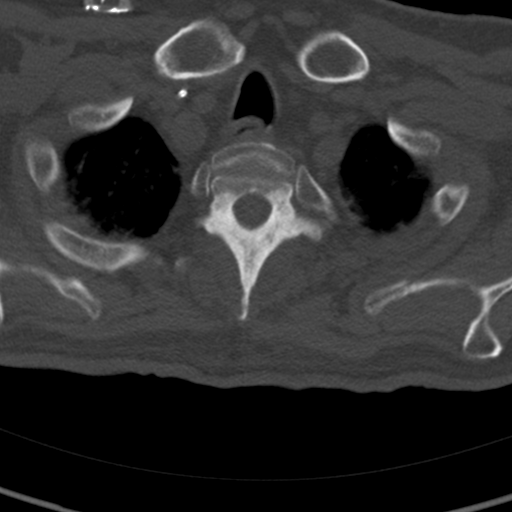

[Series 8: coronal bone · coronal · 0.40mm/px · 3 of 95 slices shown]
[im 19/95  bone]
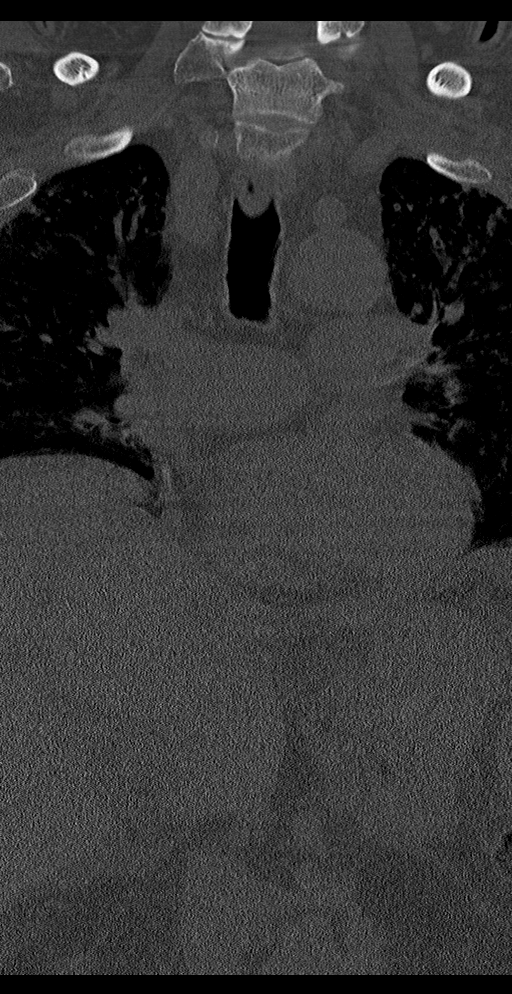
[im 38/95  bone]
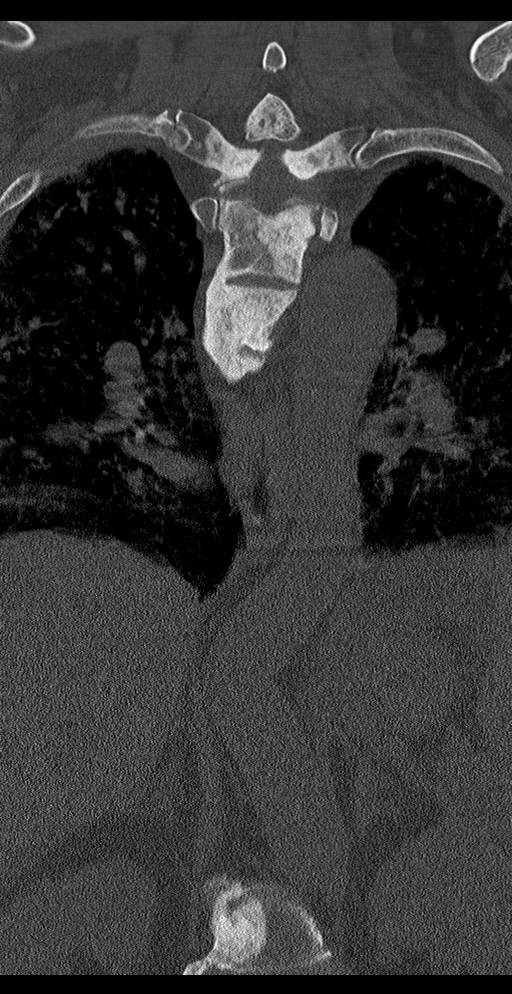
[im 57/95  bone]
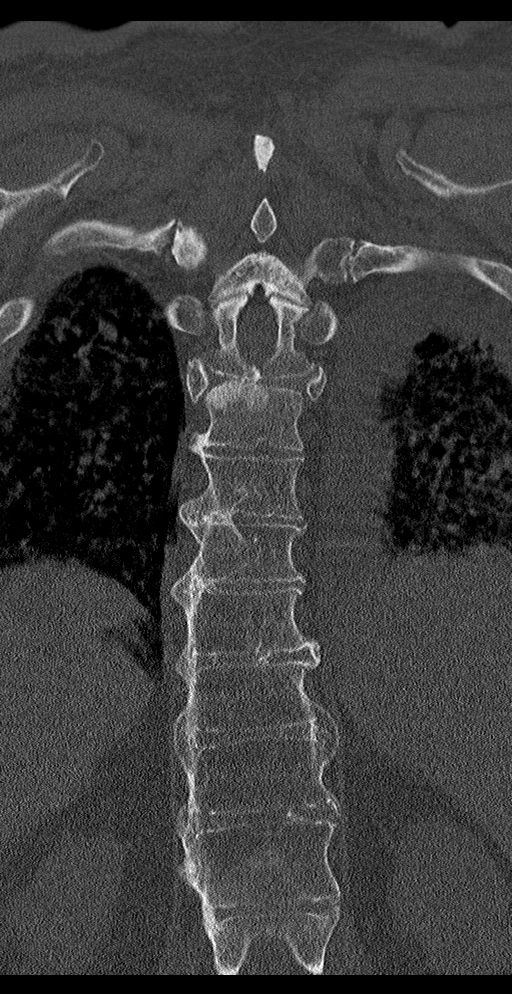

[Series 9: sagittal bone · sagittal · 0.56mm/px · 5 of 61 slices shown, 6 images]
[im 21/61  bone]
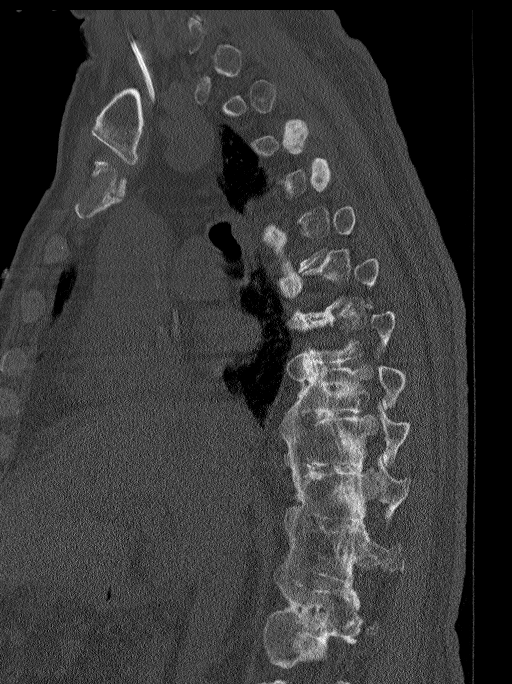
[im 26/61  bone]
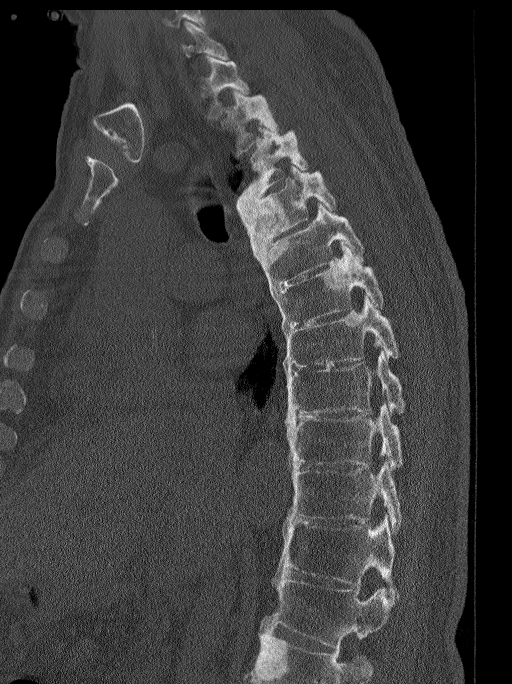
[im 31/61  soft-tissue]
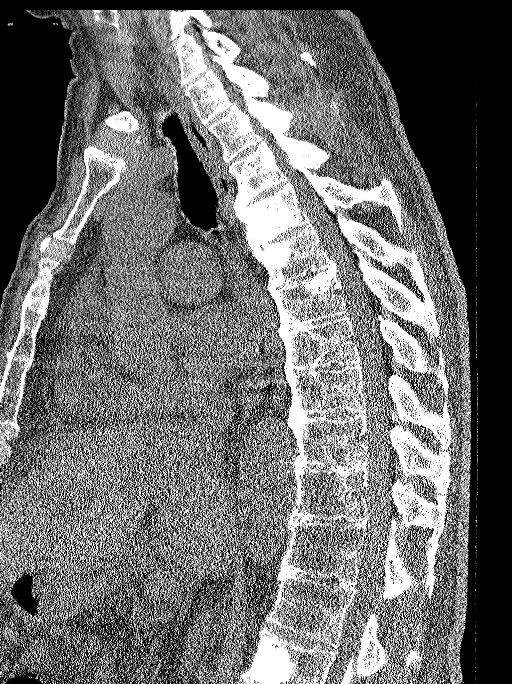
[im 31/61  bone]
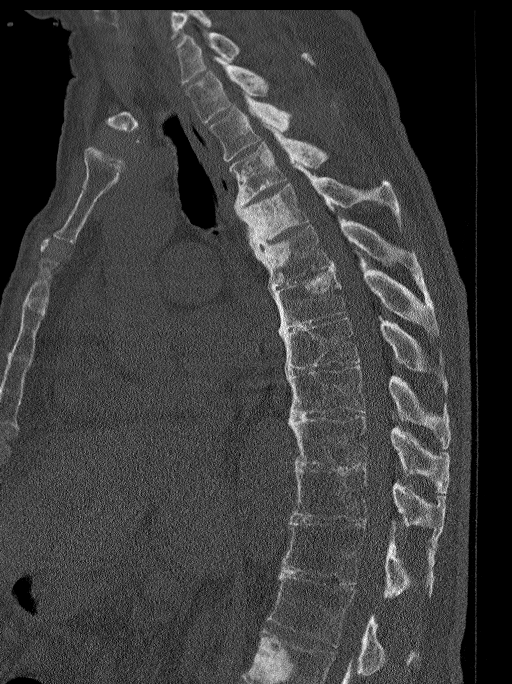
[im 36/61  bone]
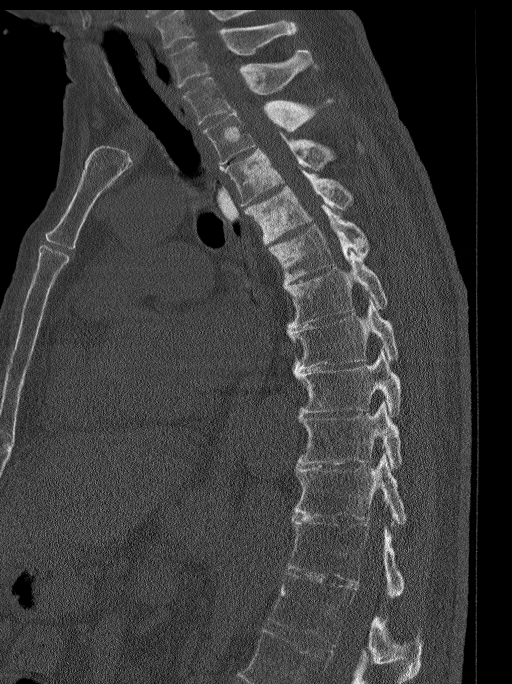
[im 41/61  bone]
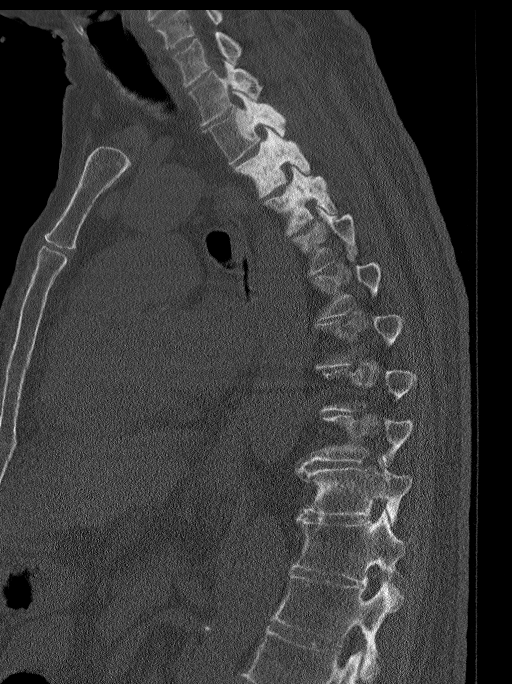

[12 of 33 positions shown; findings below may reference images not displayed]

FINDINGS: Alignment: No vertebral subluxation is observed.

Vertebrae: Widespread osseous metastatic disease in the thoracic
spine. Index left eccentric lesion at T3 measures 2.5 by 1.8 by
cm. Occasional rib lesions noted. Posterior element involvement at
multiple levels notably at T1, T2, T3, and T4.

Paraspinal and other soft tissues: Miliary nodules throughout the
lungs probably from widespread metastatic disease to the lungs. If
the patient is immunocompromised than miliary infection might also
be considered. Small to moderate left and small right pleural
effusions. Right Port-A-Cath tip: Cavoatrial junction. Aortic and
coronary atherosclerosis.

Scattered hypodense lesions in the liver compatible with metastatic
disease. Pancreatic tail mass. Suspected perihepatic ascites.

CT is not considered sensitive for epidural tumor.

Disc levels: No significant osseous foraminal narrowing is
identified in the thoracic spine.
IMPRESSION: 1. Widespread sclerotic osseous metastatic disease in the thoracic
spine. Please note that today's exam is not considered sensitive in
ruling out epidural tumor.
2. Miliary nodules throughout the lungs probably from widespread
metastatic disease to the lungs. If the patient is immunocompromised
than miliary infection might also be considered.
3. Hepatic metastatic disease.
4. Pancreatic tail mass.
5. Small to moderate left and small right pleural effusions.
6. Suspected perihepatic ascites.
7. Aortic atherosclerosis.

Aortic Atherosclerosis ([0Y]-[0Y]).

## 2020-06-02 MED ORDER — SODIUM CHLORIDE 0.9 % IV SOLN
40.0000 mg | Freq: Once | INTRAVENOUS | Status: AC
  Administered 2020-06-02: 40 mg via INTRAVENOUS
  Filled 2020-06-02: qty 4

## 2020-06-02 MED ORDER — POTASSIUM CHLORIDE CRYS ER 20 MEQ PO TBCR
40.0000 meq | EXTENDED_RELEASE_TABLET | Freq: Once | ORAL | Status: AC
  Administered 2020-06-02: 40 meq via ORAL
  Filled 2020-06-02: qty 2

## 2020-06-02 MED ORDER — HEPARIN SOD (PORK) LOCK FLUSH 100 UNIT/ML IV SOLN
500.0000 [IU] | Freq: Once | INTRAVENOUS | Status: AC
  Administered 2020-06-02: 500 [IU] via INTRAVENOUS
  Filled 2020-06-02: qty 5

## 2020-06-02 MED ORDER — SODIUM CHLORIDE 0.9 % IV BOLUS
1000.0000 mL | Freq: Once | INTRAVENOUS | Status: AC
  Administered 2020-06-02: 1000 mL via INTRAVENOUS

## 2020-06-02 MED ORDER — MORPHINE SULFATE (PF) 2 MG/ML IV SOLN: 1.0000 mg | INTRAVENOUS | Status: DC | PRN

## 2020-06-02 MED ORDER — DILTIAZEM HCL 25 MG/5ML IV SOLN
10.0000 mg | Freq: Once | INTRAVENOUS | Status: AC
  Administered 2020-06-02: 10 mg via INTRAVENOUS
  Filled 2020-06-02: qty 5

## 2020-06-02 MED ORDER — LORAZEPAM 2 MG/ML IJ SOLN
1.0000 mg | Freq: Once | INTRAMUSCULAR | Status: AC
  Administered 2020-06-02: 1 mg via INTRAVENOUS

## 2020-06-02 MED ORDER — LORAZEPAM 2 MG/ML IJ SOLN
1.0000 mg | Freq: Once | INTRAMUSCULAR | Status: AC
  Administered 2020-06-02: 1 mg via INTRAVENOUS
  Filled 2020-06-02: qty 1

## 2020-06-02 MED ORDER — SODIUM CHLORIDE 0.9 % IV SOLN
INTRAVENOUS | Status: DC
  Filled 2020-06-02 (×2): qty 250

## 2020-06-02 MED ORDER — ENOXAPARIN SODIUM 120 MG/0.8ML ~~LOC~~ SOLN
105.0000 mg | Freq: Two times a day (BID) | SUBCUTANEOUS | Status: DC
  Administered 2020-06-03 (×3): 105 mg via SUBCUTANEOUS
  Filled 2020-06-02 (×4): qty 0.7

## 2020-06-02 MED ORDER — POTASSIUM CHLORIDE 10 MEQ/100ML IV SOLN
10.0000 meq | INTRAVENOUS | Status: AC
  Administered 2020-06-02 (×4): 10 meq via INTRAVENOUS
  Filled 2020-06-02 (×4): qty 100

## 2020-06-02 MED ORDER — INSULIN REGULAR HUMAN 100 UNIT/ML IJ SOLN
20.0000 [IU] | Freq: Once | INTRAMUSCULAR | Status: AC
  Administered 2020-06-02: 20 [IU] via SUBCUTANEOUS

## 2020-06-02 MED ORDER — LORAZEPAM 2 MG/ML IJ SOLN
INTRAMUSCULAR | Status: AC
  Filled 2020-06-02: qty 1

## 2020-06-02 MED ORDER — SODIUM CHLORIDE 0.9% FLUSH
10.0000 mL | INTRAVENOUS | Status: DC | PRN
  Administered 2020-06-02: 10 mL via INTRAVENOUS
  Filled 2020-06-02: qty 10

## 2020-06-02 MED ORDER — DEXAMETHASONE SODIUM PHOSPHATE 10 MG/ML IJ SOLN
10.0000 mg | Freq: Once | INTRAMUSCULAR | Status: AC
  Administered 2020-06-02: 10 mg via INTRAVENOUS
  Filled 2020-06-02: qty 1

## 2020-06-02 NOTE — Progress Notes (Signed)
Oncology Nurse Navigator Documentation  Oncology Nurse Navigator Flowsheets 06/02/2020  Abnormal Finding Date -  Diagnosis Status -  Planned Course of Treatment -  Phase of Treatment -  Chemotherapy Actual Start Date: -  Navigator Follow Up Date: 06/04/2020  Navigator Follow Up Reason: Follow-up Appointment  Navigator Location [facility]  Referral Date to RadOnc/MedOnc -  Navigator Encounter Type Treatment  Telephone -  Treatment Initiated Date -  Patient Visit Type MedOnc  Treatment Phase Active Tx  Barriers/Navigation Needs Anxiety;Pain;Morbidities/Frailty  Education -  Interventions Psycho-Social Support  Acuity Level 2-Minimal Needs (1-2 Barriers Identified)  Referrals -  Coordination of Care -  Education Method -  Support Groups/Services Friends and Family  Time Spent with Patient 75

## 2020-06-02 NOTE — Progress Notes (Signed)
Attempted MRI on 06-02-20. We got about 18mins into the scan when pt squeezed his call bell stating he needs to come out of scanner. We came into the room and pt stated he wasn't going to do the scan he was done. We explained that we could try with more medication and pt then stated "I am refusing." Pt was taken off the table to sent back to the ED. Pt was medicated when he came down.

## 2020-06-02 NOTE — Progress Notes (Signed)
Hematology and Oncology Follow Up Visit  Melvin [name] [id number] 19-Mar-1961 59 y.o. 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  His wife called this morning saying that he was very weak.  She is that his right leg was cold.  She said he could not stand on the right leg because of pain.  He was recent diagnosed with a extensive DVT in the right leg.  He had been on Xarelto for pulmonary emboli on.  He started Xarelto about 5 weeks ago.  He has started Lovenox last week.  Unfortunate, his wife has been doing Lovenox and also Xarelto.  He just looks weaker.  He is not having any chest pain.  Is not having increased shortness of breath.  He absolutely has no stamina.  There is been no obvious bleeding that he can see.  He has had no melena or bright red blood per rectum.  He has had no diarrhea.  He has had no vomiting.  I know that he is trying his best.  Unfortunately, I am not convinced that we are really doing much for him with respect to treatment.  He has had 1 cycle of Gemzar/Abraxane.  Monday night, after chemotherapy, he had a temperature of 101.  We did check a urine on him today.  The urine did not show any bacteria or any leukocyte esterase.  I think that the temperature probably was a reaction to the gemcitabine.  Melvin [name] is trying his best.  He is doing everything we asked him to do.   Of note, his last CA 19-9 was 102,000.  This actually was down from 129,000.  He is eating  okay according to he and his wife.  I would have to say that his performance status for now is ECOG 3.    Medications:  Allergies as of 06/02/2020   No Known Allergies     Medication List       Accurate as of June 02, 2020  3:57 PM. If you have any questions, ask your nurse or doctor.        atorvastatin 10 MG tablet Commonly known as: LIPITOR Take 1 tablet (10 mg total) by mouth daily.   Creon 24000-76000 units Cpep Generic drug: Pancrelipase (Lip-Prot-Amyl) TAKE 3 CAPSULE BY MOUTH IN THE MORNING, NOON, AND AT NIGHT AT BEDTIME   dexamethasone 4 MG tablet Commonly known as: DECADRON Take 4 mg by mouth 2 (two) times daily with a meal. Takes 2 tabs PO daily, start day after chemo x 3 days with food.   dronabinol 5 MG capsule Commonly known as: MARINOL Take 1 capsule (5 mg total) by mouth 2 (two) times daily before lunch and supper.   enoxaparin 120 MG/0.8ML injection Commonly known as: LOVENOX Inject 0.67 mLs (100 mg total) into the skin every 12 (twelve) hours.   fentaNYL 12 MCG/HR Commonly known as: [place] 1 patch onto the skin every 3 (three) days.  fluconazole 100 MG tablet Commonly known as: DIFLUCAN Take 1 tablet (100 mg total) by mouth daily.   gabapentin 300 MG capsule Commonly known as: NEURONTIN Take 1 capsule (300 mg total) by mouth 3 (three) times daily.   HYDROcodone-acetaminophen 5-325 MG tablet Commonly known as: NORCO/VICODIN Take 1-2 tablets by mouth every 6 (six) hours as needed for moderate pain or severe pain.   HYDROcodone-homatropine 5-1.5 MG/5ML syrup Commonly known as: Hycodan Take 5 mLs by mouth every 6 (six) hours as needed for cough.   lidocaine-prilocaine cream Commonly known as: EMLA Apply 1 application topically as needed. Use on portacath as directed approx 1-2 hours prior to chemotherapy   loperamide 2 MG capsule Commonly known as: Imodium A-D Take 1 capsule (2 mg total) by mouth as needed for diarrhea or loose  stools. Take 2 tablets at onset of diarrhea, then 1 every 2 hours until 12 hours without a BM.   LORazepam 0.5 MG tablet Commonly known as: ATIVAN TAKE 1 TABLET BY MOUTH EVERY 8 HOURS AS NEEDED FOR NAUSEA   losartan 25 MG tablet Commonly known as: COZAAR Take 1 tablet (25 mg total) by mouth daily.   metFORMIN 1000 MG tablet Commonly known as: GLUCOPHAGE Take 1 tablet (1,000 mg total) by mouth 2 (two) times daily with a meal.   metoCLOPramide 10 MG tablet Commonly known as: REGLAN Take 0.5 tablets (5 mg total) by mouth as needed for nausea (nausea/reflux).   NovoLOG FlexPen 100 UNIT/ML FlexPen Generic drug: insulin aspart Inject 15 Units into the skin 3 (three) times daily with meals.   ondansetron 8 MG disintegrating tablet Commonly known as: ZOFRAN-ODT Take 1 tablet (8 mg total) by mouth every 8 (eight) hours as needed for nausea.   ondansetron 8 MG tablet Commonly known as: ZOFRAN Take 1 tablet (8 mg total) by mouth 2 (two) times daily. As needed.  Start on Day 3 after chemotherapy.   Pen Needles 30G X 8 MM Misc 1 each by Does not apply route as directed.   prochlorperazine 10 MG tablet Commonly known as: COMPAZINE Take 1 tablet (10 mg total) by mouth every 6 (six) hours as needed for nausea or vomiting.   rivaroxaban 20 MG Tabs tablet Commonly known as: XARELTO Take 1 tablet (20 mg total) by mouth daily with supper.       Allergies: No Known Allergies  Past Medical History, Surgical history, Social history, and Family History were reviewed and updated.  Review of Systems: Review of Systems  Constitutional: Positive for malaise/fatigue.  HENT: Negative.   Eyes: Negative.   Respiratory: Positive for cough.   Cardiovascular: Positive for leg swelling.  Gastrointestinal: Positive for diarrhea and vomiting.  Genitourinary: Negative.   Musculoskeletal: Positive for myalgias.  Neurological: Positive for tingling.  Endo/Heme/Allergies: Negative.    Psychiatric/Behavioral: Negative.      Physical Exam:  temporal temperature is 97.3 F (36.3 C) (abnormal). His blood pressure is 135/92 (abnormal) and his pulse is 68. His respiration is 18 and oxygen saturation is 96%.   Wt Readings from Last 3 Encounters:  06/02/20 231 lb (104.8 kg)  05/28/20 231 lb 1.9 oz (104.8 kg)  05/28/20 231 lb 1.9 oz (104.8 kg)    His physical exam shows a pale white male.  He is chronically ill.  His head and neck exam shows no scleral icterus.  He has no oral thrush.  He has no adenopathy in the neck.  His lungs actually sound good bilaterally.  Cardiac exam regular  rate and rhythm with occasional irregular beat.  He has no murmurs.  Abdomen is soft.  Bowel sounds are decreased.  There is no guarding or rebound tenderness.  I cannot palpate his liver.  Extremities shows some mild swelling in the legs.  He does have some symmetric weakness in his legs.  He has good skin temperature.  He has decent pulses in his distal extremities.  I cannot palpate any venous cord in the legs.  Neurological exam is nonfocal.  Lab Results  Component Value Date   WBC 4.4 06/02/2020   HGB 10.9 (L) 06/02/2020   HCT 36.2 (L) 06/02/2020   MCV 97.8 06/02/2020   PLT 196 06/02/2020   No results found for: FERRITIN, IRON, TIBC, UIBC, IRONPCTSAT Lab Results  Component Value Date   RBC 3.70 (L) 06/02/2020   No results found for: KPAFRELGTCHN, LAMBDASER, KAPLAMBRATIO No results found for: IGGSERUM, IGA, IGMSERUM No results found for: Ronnald Ramp, A1GS, A2GS, Violet Baldy, MSPIKE, SPEI   Chemistry      Component Value Date/Time   NA 142 06/02/2020 1047   K 3.2 (L) 06/02/2020 1047   CL 101 06/02/2020 1047   CO2 33 (H) 06/02/2020 1047   BUN 12 06/02/2020 1047   CREATININE 0.60 (L) 06/02/2020 1047   CREATININE 0.75 03/08/2020 1058      Component Value Date/Time   CALCIUM 8.4 (L) 06/02/2020 1047   ALKPHOS 707 (H) 05/28/2020 0950   AST 51 (H) 05/28/2020  0950   ALT 62 (H) 05/28/2020 0950   BILITOT 1.2 05/28/2020 0950       Impression and Plan: Ms. [name] is a very pleasant 59 yo caucasian gentleman with metastatic adenocarcinoma of the pancreas with extensive disease involving the liver, lungs and lymph nodes.   I must say that we have had such a tough time with MelvinJones.  Again he is doing everything we asked him to do.  He just has a very difficult tumor.  Part of the problem is that the tumor, on molecular analysis, has the K-ras mutation.  This could certainly make the tumor inherently chemo resistant.  I just do not think that he is able to be at home.  I do still think that he is strong enough to be at home.  I really do not think that his wife is able to care for him at home.  We will have to get him admitted.  He will go to [facility].  We spoke to the ER there.  I would not think that he has had any kind of retroperitoneal bleed even though he was on the Lovenox and Xarelto.  His hemoglobin really is relatively stable.  I does think the weakness is probably more so from his malignancy than anything else.  Maybe some physical therapy can help him.  We spent about an hour with him.  Again this is incredibly complicated.  I does want his quality of life to somehow get better.   Volanda Napoleon, MD 6/16/20213:57 PM

## 2020-06-02 NOTE — ED Triage Notes (Addendum)
Pt arriving from home c/o blood clot in right leg. Also c/o nausea with no emesis. Nausea began today. Hx of stage 4 pancreatic cancer. Last chemo treatment on 6/11. No other complaints. Denies pain. Pt states he is weak when standing. Pt was a x1 assist when transferring to ED stretcher. A&O x 4.

## 2020-06-02 NOTE — Progress Notes (Signed)
ANTICOAGULATION CONSULT NOTE - Initial Consult  Pharmacy Consult for enoxaparin Indication: pulmonary embolus  No Known Allergies  Patient Measurements: Height: 6' 3.5" (191.8 cm) Weight: 104.8 kg (231 lb) IBW/kg (Calculated) : 85.65 Heparin Dosing Weight:   Vital Signs: Temp: 97.6 F (36.4 C) (06/16 1546) Temp Source: Oral (06/16 1546) BP: 153/99 (06/16 2256) Pulse Rate: 89 (06/16 2257)  Labs: Recent Labs    06/02/20 1047 06/02/20 1612 06/02/20 1701 06/02/20 1911  HGB 10.9* 10.9* 10.9*  --   HCT 36.2* 35.1* 32.0*  --   PLT 196 197  --   --   APTT 37*  --   --   --   LABPROT 18.6*  --   --   --   INR 1.6*  --   --   --   CREATININE 0.60* 0.47* 0.40*  --   TROPONINIHS  --  9  --  8    Estimated Creatinine Clearance: 134.4 mL/min (A) (by C-G formula based on SCr of 0.4 mg/dL (L)).   Medical History: Past Medical History:  Diagnosis Date  . Constipation   . Diabetes mellitus without complication ([place])   . DVT femoral (deep venous thrombosis) with thrombophlebitis, right ([place]) 05/28/2020  . Dyspnea   . High cholesterol   . Hypertension   . Multiple pulmonary emboli ([place]) 05/28/2020  . Pancreatic cancer metastasized to liver ([place]) 03/12/2020  . Pancreatic cancer metastasized to lung ([place]) 03/12/2020    Medications:  (Not in a hospital admission)  Scheduled:  . enoxaparin (LOVENOX) injection  105 mg Subcutaneous BID    Assessment: Patient on rivaroxaban and on 6/11 was suppose to change to enoxaparin.  Per MD note and med rec, patient was getting both enoxaparin and rivaroxaban with last dose for both noted 6/16 at 0800.  MD aware and only wants enoxaparin going forward and to schedule next dose based on last enoxaparin dose.  Patient with good renal function.  Goal of Therapy:  Anti-Xa level 0.6-1 units/ml 4hrs after LMWH dose given Monitor platelets by anticoagulation protocol: Yes   Plan:  Enoxaparin 105mg  sq q12hr CBC q 3 days  Nani Skillern  [place] 06/02/2020,11:12 PM
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
## 2020-06-02 NOTE — ED Notes (Signed)
RN and MD made aware of critical value result from [facility]

## 2020-06-02 NOTE — ED Notes (Signed)
Pt transported to MRI 

## 2020-06-02 NOTE — Telephone Encounter (Signed)
Call received from patient's wife stating that pt has decreased feeling from his right hip down to foot, is unable to hold his weight on the right leg when standing, right leg is cold to touch, pt fell on Monday without injury, right ankle is swollen and that right legs turns "maroon" in color when he stands.  Dr. Marin Olp notified and would like for pt to come to the office now to be seen.  Pt.'s wife informed and states they can be here in 30 minutes.  Dr. Marin Olp notified.

## 2020-06-03 ENCOUNTER — Other Ambulatory Visit: Payer: Self-pay

## 2020-06-03 ENCOUNTER — Inpatient Hospital Stay (HOSPITAL_COMMUNITY): Payer: PRIVATE HEALTH INSURANCE

## 2020-06-03 ENCOUNTER — Encounter (HOSPITAL_COMMUNITY): Payer: Self-pay | Admitting: Family Medicine

## 2020-06-03 ENCOUNTER — Other Ambulatory Visit (HOSPITAL_COMMUNITY): Payer: PRIVATE HEALTH INSURANCE

## 2020-06-03 ENCOUNTER — Telehealth: Payer: Self-pay | Admitting: Hematology & Oncology

## 2020-06-03 DIAGNOSIS — C787 Secondary malignant neoplasm of liver and intrahepatic bile duct: Secondary | ICD-10-CM | POA: Diagnosis not present

## 2020-06-03 DIAGNOSIS — E876 Hypokalemia: Secondary | ICD-10-CM

## 2020-06-03 DIAGNOSIS — C189 Malignant neoplasm of colon, unspecified: Secondary | ICD-10-CM

## 2020-06-03 DIAGNOSIS — R9431 Abnormal electrocardiogram [ECG] [EKG]: Secondary | ICD-10-CM | POA: Diagnosis not present

## 2020-06-03 DIAGNOSIS — R531 Weakness: Secondary | ICD-10-CM

## 2020-06-03 DIAGNOSIS — R29898 Other symptoms and signs involving the musculoskeletal system: Secondary | ICD-10-CM | POA: Diagnosis not present

## 2020-06-03 DIAGNOSIS — I82401 Acute embolism and thrombosis of unspecified deep veins of right lower extremity: Secondary | ICD-10-CM

## 2020-06-03 DIAGNOSIS — C7800 Secondary malignant neoplasm of unspecified lung: Secondary | ICD-10-CM | POA: Diagnosis not present

## 2020-06-03 DIAGNOSIS — E119 Type 2 diabetes mellitus without complications: Secondary | ICD-10-CM

## 2020-06-03 DIAGNOSIS — I82411 Acute embolism and thrombosis of right femoral vein: Secondary | ICD-10-CM | POA: Diagnosis not present

## 2020-06-03 DIAGNOSIS — C7951 Secondary malignant neoplasm of bone: Secondary | ICD-10-CM | POA: Diagnosis not present

## 2020-06-03 DIAGNOSIS — C259 Malignant neoplasm of pancreas, unspecified: Secondary | ICD-10-CM

## 2020-06-03 DIAGNOSIS — J90 Pleural effusion, not elsewhere classified: Secondary | ICD-10-CM

## 2020-06-03 LAB — CBC
HCT: 33.5 % — ABNORMAL LOW (ref 39.0–52.0)
Hemoglobin: 10.6 g/dL — ABNORMAL LOW (ref 13.0–17.0)
MCH: 30.4 pg (ref 26.0–34.0)
MCHC: 31.6 g/dL (ref 30.0–36.0)
MCV: 96 fL (ref 80.0–100.0)
Platelets: 139 10*3/uL — ABNORMAL LOW (ref 150–400)
RBC: 3.49 MIL/uL — ABNORMAL LOW (ref 4.22–5.81)
RDW: 17.7 % — ABNORMAL HIGH (ref 11.5–15.5)
WBC: 2.1 10*3/uL — ABNORMAL LOW (ref 4.0–10.5)
nRBC: 0 % (ref 0.0–0.2)

## 2020-06-03 LAB — BASIC METABOLIC PANEL
Anion gap: 10 (ref 5–15)
BUN: 10 mg/dL (ref 6–20)
CO2: 29 mmol/L (ref 22–32)
Calcium: 7.8 mg/dL — ABNORMAL LOW (ref 8.9–10.3)
Chloride: 100 mmol/L (ref 98–111)
Creatinine, Ser: 0.48 mg/dL — ABNORMAL LOW (ref 0.61–1.24)
GFR calc Af Amer: >60 mL/min (ref >60)
GFR calc non Af Amer: >60 mL/min (ref >60)
Glucose, Bld: 302 mg/dL — ABNORMAL HIGH (ref 70–99)
Potassium: 3.8 mmol/L (ref 3.5–5.1)
Sodium: 139 mmol/L (ref 135–145)

## 2020-06-03 LAB — GLUCOSE, CAPILLARY
Glucose-Capillary: 299 mg/dL — ABNORMAL HIGH (ref 70–99)
Glucose-Capillary: 341 mg/dL — ABNORMAL HIGH (ref 70–99)
Glucose-Capillary: 337 mg/dL — ABNORMAL HIGH (ref 70–99)
Glucose-Capillary: 298 mg/dL — ABNORMAL HIGH (ref 70–99)
Glucose-Capillary: 275 mg/dL — ABNORMAL HIGH (ref 70–99)

## 2020-06-03 LAB — LACTIC ACID, PLASMA
Lactic Acid, Venous: 1.5 mmol/L (ref 0.5–1.9)
Lactic Acid, Venous: 1.8 mmol/L (ref 0.5–1.9)

## 2020-06-03 LAB — ECHOCARDIOGRAM COMPLETE
Height: 75 in
Weight: 3784.86 oz

## 2020-06-03 LAB — URINE CULTURE: Culture: <10000 — AB

## 2020-06-03 LAB — TSH: TSH: 0.554 u[IU]/mL (ref 0.350–4.500)

## 2020-06-03 LAB — HIV ANTIBODY (ROUTINE TESTING W REFLEX): HIV Screen 4th Generation wRfx: NONREACTIVE

## 2020-06-03 MED ORDER — METOPROLOL TARTRATE 25 MG PO TABS
25.0000 mg | ORAL_TABLET | Freq: Four times a day (QID) | ORAL | Status: DC
  Administered 2020-06-03 – 2020-06-06 (×13): 25 mg via ORAL
  Filled 2020-06-03 (×14): qty 1

## 2020-06-03 MED ORDER — CHLORHEXIDINE GLUCONATE CLOTH 2 % EX PADS
6.0000 | MEDICATED_PAD | Freq: Every day | CUTANEOUS | Status: DC
  Administered 2020-06-03 – 2020-06-05 (×3): 6 via TOPICAL

## 2020-06-03 MED ORDER — FENTANYL 12 MCG/HR TD PT72
1.0000 | MEDICATED_PATCH | TRANSDERMAL | Status: DC
  Administered 2020-06-05 – 2020-06-06 (×2): 1 via TRANSDERMAL
  Filled 2020-06-03 (×2): qty 1

## 2020-06-03 MED ORDER — SODIUM CHLORIDE 0.9% FLUSH
10.0000 mL | INTRAVENOUS | Status: DC | PRN
  Administered 2020-06-06: 10 mL

## 2020-06-03 MED ORDER — INSULIN ASPART 100 UNIT/ML ~~LOC~~ SOLN
3.0000 [IU] | Freq: Three times a day (TID) | SUBCUTANEOUS | Status: DC
  Administered 2020-06-04 (×2): 3 [IU] via SUBCUTANEOUS

## 2020-06-03 MED ORDER — DRONABINOL 2.5 MG PO CAPS
5.0000 mg | ORAL_CAPSULE | Freq: Two times a day (BID) | ORAL | Status: DC
  Administered 2020-06-03 – 2020-06-06 (×7): 5 mg via ORAL
  Filled 2020-06-03 (×3): qty 1
  Filled 2020-06-03 (×2): qty 2
  Filled 2020-06-03: qty 1
  Filled 2020-06-03: qty 2

## 2020-06-03 MED ORDER — INSULIN GLARGINE 100 UNIT/ML ~~LOC~~ SOLN: 12.0000 [IU] | Freq: Every day | SUBCUTANEOUS | Status: DC

## 2020-06-03 MED ORDER — DEXAMETHASONE SODIUM PHOSPHATE 4 MG/ML IJ SOLN
4.0000 mg | Freq: Four times a day (QID) | INTRAMUSCULAR | Status: DC
  Administered 2020-06-03 – 2020-06-05 (×8): 4 mg via INTRAVENOUS
  Filled 2020-06-03 (×9): qty 1

## 2020-06-03 MED ORDER — DILTIAZEM HCL-DEXTROSE 125-5 MG/125ML-% IV SOLN (PREMIX): 5.0000 mg/h | INTRAVENOUS | Status: DC

## 2020-06-03 MED ORDER — INSULIN GLARGINE 100 UNIT/ML ~~LOC~~ SOLN
12.0000 [IU] | Freq: Every day | SUBCUTANEOUS | Status: DC
  Administered 2020-06-03 – 2020-06-04 (×2): 12 [IU] via SUBCUTANEOUS
  Filled 2020-06-03 (×3): qty 0.12

## 2020-06-03 MED ORDER — HYDROCODONE-ACETAMINOPHEN 5-325 MG PO TABS
1.0000 | ORAL_TABLET | Freq: Four times a day (QID) | ORAL | Status: DC | PRN
  Administered 2020-06-03: 1 via ORAL
  Filled 2020-06-03: qty 2

## 2020-06-03 MED ORDER — HYDROCODONE-ACETAMINOPHEN 5-325 MG PO TABS: 1.0000 | ORAL_TABLET | Freq: Four times a day (QID) | ORAL | Status: DC | PRN

## 2020-06-03 MED ORDER — PANCRELIPASE (LIP-PROT-AMYL) 12000-38000 UNITS PO CPEP
24000.0000 [IU] | ORAL_CAPSULE | Freq: Three times a day (TID) | ORAL | Status: DC
  Administered 2020-06-03 – 2020-06-06 (×11): 24000 [IU] via ORAL
  Filled 2020-06-03 (×12): qty 2

## 2020-06-03 MED ORDER — SODIUM CHLORIDE 0.9% FLUSH
10.0000 mL | Freq: Two times a day (BID) | INTRAVENOUS | Status: DC
  Administered 2020-06-03 – 2020-06-05 (×3): 10 mL

## 2020-06-03 MED ORDER — METOCLOPRAMIDE HCL 5 MG PO TABS
5.0000 mg | ORAL_TABLET | ORAL | Status: DC | PRN
  Administered 2020-06-04: 5 mg via ORAL
  Filled 2020-06-03: qty 1

## 2020-06-03 MED ORDER — GABAPENTIN 300 MG PO CAPS
300.0000 mg | ORAL_CAPSULE | Freq: Three times a day (TID) | ORAL | Status: DC
  Administered 2020-06-03 – 2020-06-06 (×10): 300 mg via ORAL
  Filled 2020-06-03 (×10): qty 1

## 2020-06-03 MED ORDER — LORAZEPAM 2 MG/ML IJ SOLN
2.0000 mg | Freq: Once | INTRAMUSCULAR | Status: AC | PRN
  Administered 2020-06-04: 2 mg via INTRAVENOUS
  Filled 2020-06-03: qty 1

## 2020-06-03 MED ORDER — HYDROCODONE-HOMATROPINE 5-1.5 MG/5ML PO SYRP: 5.0000 mL | ORAL_SOLUTION | Freq: Four times a day (QID) | ORAL | Status: DC | PRN

## 2020-06-03 MED ORDER — INSULIN ASPART 100 UNIT/ML ~~LOC~~ SOLN
0.0000 [IU] | Freq: Three times a day (TID) | SUBCUTANEOUS | Status: DC
  Administered 2020-06-03: 11 [IU] via SUBCUTANEOUS
  Administered 2020-06-03: 8 [IU] via SUBCUTANEOUS
  Administered 2020-06-03 – 2020-06-04 (×2): 11 [IU] via SUBCUTANEOUS
  Administered 2020-06-04 – 2020-06-05 (×2): 15 [IU] via SUBCUTANEOUS
  Filled 2020-06-03: qty 0.15

## 2020-06-03 MED ORDER — METOPROLOL TARTRATE 5 MG/5ML IV SOLN
5.0000 mg | INTRAVENOUS | Status: DC | PRN
  Administered 2020-06-03: 5 mg via INTRAVENOUS
  Filled 2020-06-03: qty 5

## 2020-06-03 MED ORDER — INSULIN ASPART 100 UNIT/ML ~~LOC~~ SOLN
0.0000 [IU] | Freq: Every day | SUBCUTANEOUS | Status: DC
  Administered 2020-06-03 (×2): 3 [IU] via SUBCUTANEOUS
  Administered 2020-06-04: 5 [IU] via SUBCUTANEOUS
  Filled 2020-06-03: qty 0.05

## 2020-06-03 MED ORDER — LOSARTAN POTASSIUM 25 MG PO TABS
25.0000 mg | ORAL_TABLET | Freq: Every day | ORAL | Status: DC
  Administered 2020-06-03: 25 mg via ORAL
  Filled 2020-06-03: qty 1

## 2020-06-03 NOTE — Plan of Care (Signed)
  Problem: Clinical Measurements: Goal: Respiratory complications will improve Outcome: Progressing   Problem: Activity: Goal: Risk for activity intolerance will decrease Outcome: Progressing   

## 2020-06-03 NOTE — Consult Note (Addendum)
Cardiology Consultation:   Patient ID: Melvin [name]; [id number]; 15-May-1961   Admit date: 06/02/2020 Date of Consult: 06/03/2020  Primary Care Provider: Emeterio Reeve, DO Primary Cardiologist: No primary care provider on file. New Primary Electrophysiologist:  None   Patient Profile:   Melvin [name] is a 59 y.o. male with a hx of DM, HTN, HLD, pancreatic CA w/ mets to liver/lung, dx saddle embolus PE 04/20/2020 place on Xarleto 20 mg qd, dx DVT 05/28/2020 >>lovenox, who is being seen today for the evaluation of tachycardia at the request of Dr Florene Glen.  History of Present Illness:   [name] was admitted 06/16 with recurrent falls and bilateral LE weakness.  He was noted to be tachycardic and cardiology asked to evaluate him.   [name] is not aware of any tachycardia.  He has never had palpitations.  He has not had presyncope or syncope.    He has had some orthostatic dizziness recently.  His wife states that he has good p.o. intake, drinking 5 or 6 bottles of water daily pretty well.  He has had some falls, but that is due to right greater than left lower extremity weakness and numbness.  That is why he came to the hospital.  He has not had any chest pain.  He has had lower extremity edema in the last 2 weeks, right greater than left.  He has also had some orthopnea.  He has significant dyspnea on exertion as well.  This has been much more noticeable since being diagnosed with a PE.  Prior to that, he had some dyspnea on exertion that may be related to weakness from having been on chemotherapy.   Past Medical History:  Diagnosis Date  . Constipation   . Diabetes mellitus without complication ([place])   . DVT femoral (deep venous thrombosis) with thrombophlebitis, right ([place]) 05/28/2020  . Dyspnea   . High cholesterol   . Hypertension   . Multiple pulmonary emboli ([place]) 04/20/2020  . Pancreatic cancer metastasized to liver ([place]) 03/12/2020  . Pancreatic cancer metastasized to  lung [facility]) 03/12/2020    Past Surgical History:  Procedure Laterality Date  . IR IMAGING GUIDED PORT INSERTION  03/19/2020  . NO PAST SURGERIES       Prior to Admission medications   Medication Sig Start Date End Date Taking? Authorizing Provider  CREON 24000-76000 units CPEP TAKE 3 CAPSULE BY MOUTH IN THE MORNING, NOON, AND AT NIGHT AT BEDTIME Patient taking differently: Take 2 capsules by mouth in the morning, at noon, and at bedtime.  05/28/20  Yes Ennever, Rudell Cobb, MD  dexamethasone (DECADRON) 4 MG tablet Take 4 mg by mouth 2 (two) times daily with a meal. Takes 2 tabs PO daily, start day after chemo x 3 days with food.   Yes [provider]  dronabinol (MARINOL) 5 MG capsule Take 1 capsule (5 mg total) by mouth 2 (two) times daily before lunch and supper. 04/30/20  Yes Volanda Napoleon, MD  enoxaparin (LOVENOX) 120 MG/0.8ML injection Inject 0.67 mLs (100 mg total) into the skin every 12 (twelve) hours. 05/28/20  Yes Ennever, Rudell Cobb, MD  fentaNYL (DURAGESIC) 12 MCG/HR Place 1 patch onto the skin every 3 (three) days. 05/07/20  Yes Volanda Napoleon, MD  fluconazole (DIFLUCAN) 100 MG tablet Take 1 tablet (100 mg total) by mouth daily. 05/07/20  Yes Volanda Napoleon, MD  gabapentin (NEURONTIN) 300 MG capsule Take 1 capsule (300 mg total) by mouth 3 (three) times daily. 04/16/20  Yes Volanda Napoleon, MD  HYDROcodone-acetaminophen (NORCO/VICODIN) 5-325 MG tablet Take 1-2 tablets by mouth every 6 (six) hours as needed for moderate pain or severe pain. 04/30/20  Yes Ennever, Rudell Cobb, MD  HYDROcodone-homatropine [facility]) 5-1.5 MG/5ML syrup Take 5 mLs by mouth every 6 (six) hours as needed for cough. 05/07/20  Yes Ennever, Rudell Cobb, MD  insulin aspart (NOVOLOG FLEXPEN) 100 UNIT/ML FlexPen Inject 15 Units into the skin 3 (three) times daily with meals. 02/16/20  Yes Emeterio Reeve, DO  loperamide (IMODIUM A-D) 2 MG capsule Take 1 capsule (2 mg total) by mouth as needed for diarrhea or loose stools.  Take 2 tablets at onset of diarrhea, then 1 every 2 hours until 12 hours without a BM. 03/19/20  Yes Ennever, Rudell Cobb, MD  LORazepam (ATIVAN) 0.5 MG tablet TAKE 1 TABLET BY MOUTH EVERY 8 HOURS AS NEEDED FOR NAUSEA 05/28/20  Yes Ennever, Rudell Cobb, MD  losartan (COZAAR) 25 MG tablet Take 1 tablet (25 mg total) by mouth daily. 04/13/20  Yes Emeterio Reeve, DO  metFORMIN (GLUCOPHAGE) 1000 MG tablet Take 1 tablet (1,000 mg total) by mouth 2 (two) times daily with a meal. Patient taking differently: Take 1,000 mg by mouth daily with breakfast.  04/13/20  Yes Emeterio Reeve, DO  metoCLOPramide (REGLAN) 10 MG tablet Take 0.5 tablets (5 mg total) by mouth as needed for nausea (nausea/reflux). 02/16/20  Yes Emeterio Reeve, DO  ondansetron (ZOFRAN-ODT) 8 MG disintegrating tablet Take 1 tablet (8 mg total) by mouth every 8 (eight) hours as needed for nausea. 03/08/20  Yes Emeterio Reeve, DO  prochlorperazine (COMPAZINE) 10 MG tablet Take 1 tablet (10 mg total) by mouth every 6 (six) hours as needed for nausea or vomiting. 04/16/20  Yes Ennever, Rudell Cobb, MD  rivaroxaban (XARELTO) 20 MG TABS tablet Take 1 tablet (20 mg total) by mouth daily with supper. 05/05/20  Yes Volanda Napoleon, MD  atorvastatin (LIPITOR) 10 MG tablet Take 1 tablet (10 mg total) by mouth daily. Patient not taking: Reported on 06/02/2020 04/13/20   Emeterio Reeve, DO  Insulin Pen Needle (PEN NEEDLES) 30G X 8 MM MISC 1 each by Does not apply route as directed. 02/16/20   Emeterio Reeve, DO  lidocaine-prilocaine (EMLA) cream Apply 1 application topically as needed. Use on portacath as directed approx 1-2 hours prior to chemotherapy 03/19/20   Volanda Napoleon, MD  ondansetron (ZOFRAN) 8 MG tablet Take 1 tablet (8 mg total) by mouth 2 (two) times daily. As needed.  Start on Day 3 after chemotherapy. Patient not taking: Reported on 06/02/2020 03/19/20   Volanda Napoleon, MD    Inpatient Medications: Scheduled Meds: . Chlorhexidine  Gluconate Cloth  6 each Topical Daily  . dexamethasone (DECADRON) injection  4 mg Intravenous Q6H  . dronabinol  5 mg Oral BID AC  . enoxaparin (LOVENOX) injection  105 mg Subcutaneous BID  . [START ON 06/05/2020] fentaNYL  1 patch Transdermal Q72H  . gabapentin  300 mg Oral TID  . insulin aspart  0-15 Units Subcutaneous TID WC  . insulin aspart  0-5 Units Subcutaneous QHS  . lipase/protease/amylase  24,000 Units Oral TID AC  . sodium chloride flush  10-40 mL Intracatheter Q12H   Continuous Infusions: . diltiazem (CARDIZEM) infusion Stopped (06/03/20 0814)   PRN Meds: HYDROcodone-acetaminophen, HYDROcodone-homatropine, metoCLOPramide, morphine injection, sodium chloride flush  Allergies:   No Known Allergies  Social History:   Social History   Socioeconomic History  . Marital status: Married  Spouse name: Not on file  . Number of children: Not on file  . Years of education: Not on file  . Highest education level: Not on file  Occupational History  . Not on file  Tobacco Use  . Smoking status: Former Smoker    Types: Cigarettes    Quit date: 03/11/1986    Years since quitting: 34.2  . Smokeless tobacco: Never Used  . Tobacco comment: Quit 1987  Vaping Use  . Vaping Use: Never used  Substance and Sexual Activity  . Alcohol use: Not on file    Comment: occ  . Drug use: Not Currently  . Sexual activity: Not on file  Other Topics Concern  . Not on file  Social History Narrative  . Not on file   Social Determinants of Health   Financial Resource Strain:   . Difficulty of Paying Living Expenses:   Food Insecurity:   . Worried About Charity fundraiser in the Last Year:   . Arboriculturist in the Last Year:   Transportation Needs:   . Film/video editor (Medical):   Marland Kitchen Lack of Transportation (Non-Medical):   Physical Activity:   . Days of Exercise per Week:   . Minutes of Exercise per Session:   Stress:   . Feeling of Stress :   Social Connections:   .  Frequency of Communication with Friends and Family:   . Frequency of Social Gatherings with Friends and Family:   . Attends Religious Services:   . Active Member of Clubs or Organizations:   . Attends Archivist Meetings:   Marland Kitchen Marital Status:   Intimate Partner Violence:   . Fear of Current or Ex-Partner:   . Emotionally Abused:   Marland Kitchen Physically Abused:   . Sexually Abused:     Family History:   Family History  Adopted: Yes  Family history unknown: Yes   Family Status:  No data available, patient is adopted  ROS:  Please see the history of present illness.  All other ROS reviewed and negative.     Physical Exam/Data:   Vitals:   06/02/20 2257 06/03/20 0100 06/03/20 0135 06/03/20 0542  BP:  (!) 141/97 (!) 170/119 (!) 154/101  Pulse: 89 81 89 92  Resp:  18  18  Temp:   97.7 F (36.5 C) (!) 97.4 F (36.3 C)  TempSrc:   Oral Oral  SpO2: 94% 99% 96% 94%  Weight:   107.3 kg   Height:   6\' 3"  (1.905 m)     Intake/Output Summary (Last 24 hours) at 06/03/2020 1048 Last data filed at 06/03/2020 0500 Gross per 24 hour  Intake 1400 ml  Output 425 ml  Net 975 ml    Last 3 Weights 06/03/2020 06/02/2020 06/02/2020  Weight (lbs) 236 lb 8.9 oz 231 lb (No Data)  Weight (kg) 107.3 kg 104.781 kg (No Data)     Body mass index is 29.57 kg/m.   General:  Well nourished, well developed, male in no acute distress HEENT: normal Lymph: no adenopathy Neck: JVD -not seen elevated Endocrine:  No thryomegaly Vascular: No carotid bruits; 4/4 extremity pulses 2+  Cardiac:  normal S1, S2; RRR; no murmur Lungs:  clear bilaterally, no wheezing, rhonchi or rales  Abd: soft, nontender, no hepatomegaly  Ext: no edema Musculoskeletal:  No deformities, BUE and BLE strength normal and equal Skin: warm and dry  Neuro:  CNs 2-12 intact, no focal abnormalities noted Psych:  Normal  affect   EKG:  The EKG was personally reviewed and demonstrates: 6/17 ECG is sinus tachycardia with occasional  PVCs, no acute ischemic changes, heart rate 132 Telemetry:  Telemetry was personally reviewed and demonstrates: Sinus tachycardia with PVCs, runs of SVT and VT noted   CV studies:   ECHO: ordered  CTA CHEST, PE PROTOCOL: 04/20/2020 IMPRESSION: 1. Study is positive for saddle embolus and large burden of predominantly nonocclusive clot throughout main, lobar, segmental and subsegmental sized pulmonary arteries bilaterally. 2. Widespread metastatic disease to the lungs and liver redemonstrated, as above. 3. Increasingly evident areas of sclerosis throughout the visualized axial and appendicular skeleton, compatible with metastatic disease to the bones. 4. Small right pleural effusion lying dependently. 5. Aortic atherosclerosis, in addition to left main and left anterior descending coronary artery disease.  Aortic Atherosclerosis (ICD10-I70.0).  RLE [place]: 05/28/2020 FINDINGS: Contralateral Common Femoral Vein: Respiratory phasicity is normal and symmetric with the symptomatic side. No evidence of thrombus. Normal compressibility.  Common Femoral Vein: No evidence of thrombus. Normal compressibility, respiratory phasicity and response to augmentation.  Saphenofemoral Junction: No evidence of thrombus. Normal compressibility and flow on color Doppler imaging.  Profunda Femoral Vein: No evidence of thrombus. Normal compressibility and flow on color Doppler imaging.  Femoral Vein: There is hypoechoic occlusive thrombus involving the proximal (image 80 and 9), mid (images 12 and 13) and distal (images 14 and 15) aspects of right femoral vein.  Popliteal Vein: There is hypoechoic occlusive thrombus involving the right popliteal vein (images 19 and 21).  Calf Veins: There is hypoechoic occlusive thrombus involving right posterior tibial (image 28) and peroneal (image 30) veins.  Superficial Great Saphenous Vein: No evidence of thrombus.  Normal compressibility.  Venous Reflux:  None.  Other Findings: There is hypoechoic occlusive thrombus involving the right gastrocnemius vein (images 22 through 25).  IMPRESSION: 1. The examination is positive for occlusive DVT extending from the proximal aspect of the right femoral vein through the imaged right tibial veins. 2. Examination is also positive for occlusive superficial thrombophlebitis involving the right gastrocnemius vein.   Laboratory Data:   Chemistry Recent Labs  Lab 06/02/20 1047 06/02/20 1047 06/02/20 1612 06/02/20 1701 06/03/20 0200  NA 142   < > 143 144 139  K 3.2*   < > 2.9* 2.7* 3.8  CL 101   < > 105 100 100  CO2 33*  --  30  --  29  GLUCOSE 397*   < > 256* 229* 302*  BUN 12   < > 12 10 10   CREATININE 0.60*  --  0.47* 0.40* 0.48*  CALCIUM 8.4*  --  7.8*  --  7.8*  GFRNONAA >60  --  >60  --  >60  GFRAA >60  --  >60  --  >60  ANIONGAP 8  --  8  --  10   < > = values in this interval not displayed.    Lab Results  Component Value Date   ALT 174 (H) 06/02/2020   AST 146 (H) 06/02/2020   ALKPHOS 522 (H) 06/02/2020   BILITOT 1.0 06/02/2020   Hematology Recent Labs  Lab 06/02/20 1047 06/02/20 1047 06/02/20 1612 06/02/20 1701 06/03/20 0200  WBC 4.4  --  3.9*  --  2.1*  RBC 3.70*  --  3.56*  --  3.49*  HGB 10.9*  --  10.9* 10.9* 10.6*  HCT 36.2*   < > 35.1* 32.0* 33.5*  MCV 97.8  --  98.6  --  96.0  MCH 29.5  --  30.6  --  30.4  MCHC 30.1  --  31.1  --  31.6  RDW 17.8*  --  18.0*  --  17.7*  PLT 196  --  197  --  139*   < > = values in this interval not displayed.   Cardiac Enzymes High Sensitivity Troponin:   Recent Labs  Lab 06/02/20 1612 06/02/20 1911  TROPONINIHS 9 8      BNPNo results for input(s): BNP, PROBNP in the last 168 hours.  DDimer No results for input(s): DDIMER in the last 168 hours. TSH: No results found for: TSH Lipids: Lab Results  Component Value Date   CHOL 132 08/04/2019   HDL 33 (L) 08/04/2019    LDLCALC 79 08/04/2019   TRIG 118 08/04/2019   CHOLHDL 4.0 08/04/2019   HgbA1c: Lab Results  Component Value Date   HGBA1C 8.9 (A) 05/06/2020   Magnesium: No results found for: MG   Radiology/Studies:  CT Thoracic Spine Wo Contrast  Result Date: 06/02/2020 CLINICAL DATA:  Metastatic pancreatic adenocarcinoma. Back pain and bilateral leg weakness. EXAM: CT THORACIC SPINE WITHOUT CONTRAST TECHNIQUE: Multidetector CT images of the thoracic were obtained using the standard protocol without intravenous contrast. COMPARISON:  MRI thoracic spine from 06/02/2020 FINDINGS: Alignment: No vertebral subluxation is observed. Vertebrae: Widespread osseous metastatic disease in the thoracic spine. Index left eccentric lesion at T3 measures 2.5 by 1.8 by 2.2 cm. Occasional rib lesions noted. Posterior element involvement at multiple levels notably at T1, T2, T3, and T4. Paraspinal and other soft tissues: Miliary nodules throughout the lungs probably from widespread metastatic disease to the lungs. If the patient is immunocompromised than miliary infection might also be considered. Small to moderate left and small right pleural effusions. Right Port-A-Cath tip: Cavoatrial junction. Aortic and coronary atherosclerosis. Scattered hypodense lesions in the liver compatible with metastatic disease. Pancreatic tail mass. Suspected perihepatic ascites. CT is not considered sensitive for epidural tumor. Disc levels: No significant osseous foraminal narrowing is identified in the thoracic spine. IMPRESSION: 1. Widespread sclerotic osseous metastatic disease in the thoracic spine. Please note that today's exam is not considered sensitive in ruling out epidural tumor. 2. Miliary nodules throughout the lungs probably from widespread metastatic disease to the lungs. If the patient is immunocompromised than miliary infection might also be considered. 3. Hepatic metastatic disease. 4. Pancreatic tail mass. 5. Small to moderate  left and small right pleural effusions. 6. Suspected perihepatic ascites. 7. Aortic atherosclerosis. Aortic Atherosclerosis (ICD10-I70.0). Electronically Signed   By: Van Clines M.D.   On: 06/02/2020 19:53   CT Lumbar Spine Wo Contrast  Result Date: 06/02/2020 CLINICAL DATA:  Back pain. Bilateral leg weakness, right greater than left. Metastatic pancreatic cancer. EXAM: CT LUMBAR SPINE WITHOUT CONTRAST TECHNIQUE: Multidetector CT imaging of the lumbar spine was performed without intravenous contrast administration. Multiplanar CT image reconstructions were also generated. COMPARISON:  03/08/2020 CT abdomen FINDINGS: Segmentation: The lowest lumbar type non-rib-bearing vertebra is labeled as L5. Alignment: No vertebral subluxation is observed. Vertebrae: Scattered new sclerotic metastatic lesions in the lumbar spine and bony pelvis. These include a 2.4 by 2.3 by 2.8 cm sclerotic lesion anteriorly in the L1 vertebral body; a 4.0 by 3.3 by 3.4 cm sclerotic lesion eccentric to the right in the L5 vertebral body, and multiple sclerotic lesions in the visualized iliac bones and sacrum, as well as some other smaller vertebral lesions. No current fracture.  Bridging spurring anteriorly at T11-T12-L1. Paraspinal  and other soft tissues: Indistinct pancreatic tail compatible with mass, abutting the spleen. Aortoiliac atherosclerotic vascular disease. Disc levels: Facet and uncinate spurring contribute to osseous foraminal narrowing bilaterally at L4-5 and L5-S1, and potentially on the left at L2-3 and L3-4 as well. IMPRESSION: 1. New sclerotic metastatic lesions in the lumbar spine and bony pelvis, without fracture or vertebral subluxation. 2. Indistinct pancreatic tail compatible with mass, abutting the spleen. 3. Lumbar spondylosis and degenerative disc disease causing osseous foraminal narrowing bilaterally at L4-5 and L5-S1, and potentially on the left at L2-3 and L3-4 as well. 4. Aortic atherosclerosis. Aortic  Atherosclerosis (ICD10-I70.0). Electronically Signed   By: Van Clines M.D.   On: 06/02/2020 19:47   MR THORACIC SPINE WO CONTRAST  Result Date: 06/02/2020 CLINICAL DATA:  Mid back pain with bilateral leg weakness. Metastatic pancreatic cancer. EXAM: MRI THORACIC SPINE WITHOUT CONTRAST (LIMITED) TECHNIQUE: Coronal and sagittal localizing images the spine were obtained. The patient was not able to complete the examination. No axial imaging was performed. No intravenous contrast was administered. COMPARISON:  Chest radiographs 05/28/2020 and chest CTA 04/20/2020. FINDINGS: Alignment:  Normal. Vertebrae: Widespread osseous metastatic disease. In the thoracic spine, there are lesions within the T1, T2, T3, T4, T5 and T6 vertebral bodies. There is involvement of the posterior elements at T1, T2 and T3. No significant pathologic fracture identified, although a small amount of epidural tumor difficult to exclude at T3. Metastases are also present within the L1 and L5 vertebral bodies. Cord:  No gross cord compression on localizing images. IMPRESSION: 1. Extremely limited study terminated after the scout images were obtained. 2. Widespread osseous metastatic disease throughout the thoracic spine as described. No significant pathologic fracture identified, although a small amount of epidural tumor difficult to exclude at T3. 3. No gross cord compression on localizing images. 4. Consider further evaluation with CT or repeat MR after appropriate sedation. Electronically Signed   By: Richardean Sale M.D.   On: 06/02/2020 17:57   DG FEMUR, MIN 2 VIEWS RIGHT  Result Date: 06/02/2020 CLINICAL DATA:  Clot in right leg EXAM: RIGHT FEMUR 2 VIEWS COMPARISON:  None. FINDINGS: There is no evidence of fracture or other focal bone lesions. Soft tissues are unremarkable. IMPRESSION: Negative. Electronically Signed   By: Donavan Foil M.D.   On: 06/02/2020 19:34    Assessment and Plan:   1.  Tachycardia: -He is  asymptomatic despite multiple runs of SVT and VT. -Some of this may improve now that his potassium is finally being corrected, need to try to get it greater than 4. -Blood pressure will tolerate a beta-blocker or CCB, discuss with MD. -Echo is ordered, follow-up on results. -Baseline heart rate is elevated secondary to acute illness, hopefully he will improve soon on the Lovenox -Check TSH  2.  PE/DVT: -Patient was diagnosed with acute PE 04/20/2020, he was started on Xarelto 20 mg daily (should have been 15 mg twice daily) -When patient was diagnosed with DVT 05/28/2020, Xarelto was changed to Lovenox.  However, his wife continued to give him both until he was admitted -He is currently on therapeutic Lovenox -MD advise about other anticoagulation options  3.  Lower extremity weakness -Reason for admission, No definitive cause yet determined  4.  Hypertension: -According to patient's wife, his losartan 25 mg was recently stopped as well as atorvastatin. -SBP has been 130s-170 in the last 24 hours, he will tolerate beta-blocker or CCB   Principal Problem:   Lower extremity weakness Active Problems:  Type 2 diabetes mellitus with diabetic polyneuropathy, without long-term current use of insulin (HCC)   DVT femoral (deep venous thrombosis) with thrombophlebitis, right ([place])   Primary pancreatic cancer with metastasis to other site [facility])   Hypokalemia     For questions or updates, please contact [facility] Please consult [email or web address] for contact info under Cardiology/STEMI.   SignedRosaria Ferries, PA-C  06/03/2020 10:48 AM  Patient seen and examined with APP.  Agree as above, with the following exceptions and changes as noted below. He is tearful in the room, but asymptomatic from standpoint of SVT and NSVT. Gen: tired and tearful, CV: RRR, no murmurs, Lungs: clear, Abd: soft, Extrem: Warm, no edema, Neuro/Psych: alert, tearful. All available labs, radiology testing, previous  records reviewed. Recommend beta blockade for control of arrhythmia. Amiodarone not a good choice in setting of LFT abnormalities. Will use lopressor 25 mg Q6 hr and monitor for response. Agree with lovenox in setting of widespread malignancy.  Elouise Munroe, MD 06/03/20

## 2020-06-03 NOTE — Telephone Encounter (Signed)
No los 6/16

## 2020-06-03 NOTE — ED Notes (Signed)
ED TO INPATIENT HANDOFF REPORT  Name/Age/Gender Melvin [name] 59 y.o. male  Code Status    Code Status Orders  (From admission, onward)         Start     Ordered   06/02/20 2206  Full code  Continuous        06/02/20 2207        Code Status History    This patient has a current code status but no historical code status.   Advance Care Planning Activity      Home/SNF/Other Home  Chief Complaint Lower extremity weakness [R29.898]  Level of Care/Admitting Diagnosis ED Disposition    ED Disposition Condition Comment   Admit  Hospital Area: [place] [[id number]] Level of Care: Telemetry [5] Admit to tele based on following criteria: Complex arrhythmia (Bradycardia/Tachycardia) May admit patient to [facility] or [facility] if equivalent level of care is a vailable:: No Covid Evaluation: COVID negative Admission Type: Emergency [1] Diagnosis: Lower extremity weakness [[id number]] Admitting Physician: Orene Desanctis [[id number]] Attending Physician: Orene Desanctis [[id number]] Estimated length of stay: past midnight  tomorrow Certification:: I certify this patient will need inpatient services for at least 2 midnights       Medical History Past Medical History:  Diagnosis Date  . Constipation   . Diabetes mellitus without complication ([place])   . DVT femoral (deep venous thrombosis) with thrombophlebitis, right ([place]) 05/28/2020  . Dyspnea   . High cholesterol   . Hypertension   . Multiple pulmonary emboli ([place]) 05/28/2020  . Pancreatic cancer metastasized to liver ([place]) 03/12/2020  . Pancreatic cancer metastasized to lung ([place]) 03/12/2020    Allergies No Known Allergies  IV Location/Drains/Wounds Patient Lines/Drains/Airways Status    Active Line/Drains/Airways    Name Placement date Placement time Site Days   Implanted Port 03/19/20 Right Chest 03/19/20  --  Chest  76   Peripheral IV 06/02/20 Right Antecubital 06/02/20  1702  Antecubital  1           Labs/Imaging Results for orders placed or performed during the hospital encounter of 06/02/20 (from the past 48 hour(s))  CBC with Differential/Platelet     Status: Abnormal   Collection Time: 06/02/20  4:12 PM  Result Value Ref Range   WBC 3.9 (L) 4.0 - 10.5 K/uL   RBC 3.56 (L) 4.22 - 5.81 MIL/uL   Hemoglobin 10.9 (L) 13.0 - 17.0 g/dL   HCT 35.1 (L) 39 - 52 %   MCV 98.6 80.0 - 100.0 fL   MCH 30.6 26.0 - 34.0 pg   MCHC 31.1 30.0 - 36.0 g/dL   RDW 18.0 (H) 11.5 - 15.5 %   Platelets 197 150 - 400 K/uL   nRBC 0.0 0.0 - 0.2 %   Neutrophils Relative % 62 %   Neutro Abs 2.4 1.7 - 7.7 K/uL   Lymphocytes Relative 29 %   Lymphs Abs 1.2 0.7 - 4.0 K/uL   Monocytes Relative 3 %   Monocytes Absolute 0.1 0 - 1 K/uL   Eosinophils Relative 2 %   Eosinophils Absolute 0.1 0 - 0 K/uL   Basophils Relative 0 %   Basophils Absolute 0.0 0 - 0 K/uL   Immature Granulocytes 4 %   Abs Immature Granulocytes 0.16 (H) 0.00 - 0.07 K/uL    Comment: Performed at [facility], [place] [address] [place], [place] [address]  Comprehensive metabolic panel     Status: Abnormal   Collection Time: 06/02/20  4:12 PM  Result Value Ref Range   Sodium 143 135 - 145 mmol/L   Potassium 2.9 (L) 3.5 - 5.1 mmol/L   Chloride 105 98 - 111 mmol/L   CO2 30 22 - 32 mmol/L   Glucose, Bld 256 (H) 70 - 99 mg/dL    Comment: Glucose reference range applies only to samples taken after fasting for at least 8 hours.   BUN 12 6 - 20 mg/dL   Creatinine, Ser 0.47 (L) 0.61 - 1.24 mg/dL   Calcium 7.8 (L) 8.9 - 10.3 mg/dL   Total Protein 5.7 (L) 6.5 - 8.1 g/dL   Albumin 2.5 (L) 3.5 - 5.0 g/dL   AST 146 (H) 15 - 41 U/L   ALT 174 (H) 0 - 44 U/L   Alkaline Phosphatase 522 (H) 38 - 126 U/L   Total Bilirubin 1.0 0.3 - 1.2 mg/dL   GFR calc non Af Amer >60 >60 mL/min   GFR calc Af Amer >60 >60 mL/min   Anion gap 8 5 - 15    Comment: Performed at [facility], [place] [address] [place], [place] [address]   Lipase, blood     Status: None   Collection Time: 06/02/20  4:12 PM  Result Value Ref Range   Lipase 17 11 - 51 U/L    Comment: Performed at [facility], [place] [address] [place], [place] [address]  Troponin I (High Sensitivity)     Status: None   Collection Time: 06/02/20  4:12 PM  Result Value Ref Range   Troponin I (High Sensitivity) 9 <18 ng/L    Comment: (NOTE) Elevated high sensitivity troponin I (hsTnI) values and significant  changes across serial measurements may suggest ACS but many other  chronic and acute conditions are known to elevate hsTnI results.  Refer to the "Links" section for chest pain algorithms and additional  guidance. Performed at [facility], [place] [address] [place], [place] [address]   Lactic acid, plasma     Status: Abnormal   Collection Time: 06/02/20  5:00 PM  Result Value Ref Range   Lactic Acid, Venous 3.3 (HH) 0.5 - 1.9 mmol/L    Comment: CRITICAL RESULT CALLED TO, READ BACK BY AND VERIFIED WITH: SMITH,T. RN @1751  ON 06.16.2021 BY COHEN,K Performed at [facility], [place] [address], [place],  [address]   I-stat chem 8, ED (not at [facility] or [facility])     Status: Abnormal   Collection Time: 06/02/20  5:01 PM  Result Value Ref Range   Sodium 144 135 - 145 mmol/L   Potassium 2.7 (LL) 3.5 - 5.1 mmol/L   Chloride 100 98 - 111 mmol/L   BUN 10 6 - 20 mg/dL   Creatinine, Ser 0.40 (L) 0.61 - 1.24 mg/dL   Glucose, Bld 229 (H) 70 - 99 mg/dL    Comment: Glucose reference range applies only to samples taken after fasting for at least 8 hours.   Calcium, Ion 1.11 (L) 1.15 - 1.40 mmol/L   TCO2 31 22 - 32 mmol/L   Hemoglobin 10.9 (L) 13.0 - 17.0 g/dL   HCT 32.0 (L) 39 - 52 %  SARS Coronavirus 2 by RT PCR (hospital order, performed in [facility] hospital lab) Nasopharyngeal Nasopharyngeal Swab     Status: None   Collection Time: 06/02/20  5:22 PM   Specimen: Nasopharyngeal Swab  Result Value Ref  Range   SARS Coronavirus 2 NEGATIVE NEGATIVE  Comment: (NOTE) SARS-CoV-2 target nucleic acids are NOT DETECTED.  The SARS-CoV-2 RNA is generally detectable in upper and lower respiratory specimens during the acute phase of infection. The lowest concentration of SARS-CoV-2 viral copies this assay can detect is 250 copies / mL. A negative result does not preclude SARS-CoV-2 infection and should not be used as the sole basis for treatment or other patient management decisions.  A negative result may occur with improper specimen collection / handling, submission of specimen other than nasopharyngeal swab, presence of viral mutation(s) within the areas targeted by this assay, and inadequate number of viral copies (<250 copies / mL). A negative result must be combined with clinical observations, patient history, and epidemiological information.  Fact Sheet for Patients:   StrictlyIdeas.no  Fact Sheet for Healthcare Providers: BankingDealers.co.za  This test is not yet approved or  cleared by the [place] FDA and has been authorized for detection and/or diagnosis of SARS-CoV-2 by FDA under an Emergency Use Authorization (EUA).  This EUA will remain in effect (meaning this test can be used) for the duration of the COVID-19 declaration under Section 564(b)(1) of the Act, 21 U.S.C. section 360bbb-3(b)(1), unless the authorization is terminated or revoked sooner.  Performed at [facility], [place] [address] [place], [place] [address]   Troponin I (High Sensitivity)     Status: None   Collection Time: 06/02/20  7:11 PM  Result Value Ref Range   Troponin I (High Sensitivity) 8 <18 ng/L    Comment: (NOTE) Elevated high sensitivity troponin I (hsTnI) values and significant  changes across serial measurements may suggest ACS but many other  chronic and acute conditions are known to elevate hsTnI results.  Refer to the  "Links" section for chest pain algorithms and additional  guidance. Performed at [facility], [place] [address], [place], [place] [address]   Lactic acid, plasma     Status: Abnormal   Collection Time: 06/02/20  7:35 PM  Result Value Ref Range   Lactic Acid, Venous 2.7 (HH) 0.5 - 1.9 mmol/L    Comment: CRITICAL VALUE NOTED.  VALUE IS CONSISTENT WITH PREVIOUSLY REPORTED AND CALLED VALUE. Performed at [facility], [place] [address] [place], [place] [address]    CT Thoracic Spine Wo Contrast  Result Date: 06/02/2020 CLINICAL DATA:  Metastatic pancreatic adenocarcinoma. Back pain and bilateral leg weakness. EXAM: CT THORACIC SPINE WITHOUT CONTRAST TECHNIQUE: Multidetector CT images of the thoracic were obtained using the standard protocol without intravenous contrast. COMPARISON:  MRI thoracic spine from 06/02/2020 FINDINGS: Alignment: No vertebral subluxation is observed. Vertebrae: Widespread osseous metastatic disease in the thoracic spine. Index left eccentric lesion at T3 measures 2.5 by 1.8 by 2.2 cm. Occasional rib lesions noted. Posterior element involvement at multiple levels notably at T1, T2, T3, and T4. Paraspinal and other soft tissues: Miliary nodules throughout the lungs probably from widespread metastatic disease to the lungs. If the patient is immunocompromised than miliary infection might also be considered. Small to moderate left and small right pleural effusions. Right Port-A-Cath tip: Cavoatrial junction. Aortic and coronary atherosclerosis. Scattered hypodense lesions in the liver compatible with metastatic disease. Pancreatic tail mass. Suspected perihepatic ascites. CT is not considered sensitive for epidural tumor. Disc levels: No significant osseous foraminal narrowing is identified in the thoracic spine. IMPRESSION: 1. Widespread sclerotic osseous metastatic disease in the thoracic spine. Please note that today's exam is not considered  sensitive in ruling out epidural tumor. 2. Miliary nodules throughout the  lungs probably from widespread metastatic disease to the lungs. If the patient is immunocompromised than miliary infection might also be considered. 3. Hepatic metastatic disease. 4. Pancreatic tail mass. 5. Small to moderate left and small right pleural effusions. 6. Suspected perihepatic ascites. 7. Aortic atherosclerosis. Aortic Atherosclerosis (ICD10-I70.0). Electronically Signed   By: Van Clines M.D.   On: 06/02/2020 19:53   CT Lumbar Spine Wo Contrast  Result Date: 06/02/2020 CLINICAL DATA:  Back pain. Bilateral leg weakness, right greater than left. Metastatic pancreatic cancer. EXAM: CT LUMBAR SPINE WITHOUT CONTRAST TECHNIQUE: Multidetector CT imaging of the lumbar spine was performed without intravenous contrast administration. Multiplanar CT image reconstructions were also generated. COMPARISON:  03/08/2020 CT abdomen FINDINGS: Segmentation: The lowest lumbar type non-rib-bearing vertebra is labeled as L5. Alignment: No vertebral subluxation is observed. Vertebrae: Scattered new sclerotic metastatic lesions in the lumbar spine and bony pelvis. These include a 2.4 by 2.3 by 2.8 cm sclerotic lesion anteriorly in the L1 vertebral body; a 4.0 by 3.3 by 3.4 cm sclerotic lesion eccentric to the right in the L5 vertebral body, and multiple sclerotic lesions in the visualized iliac bones and sacrum, as well as some other smaller vertebral lesions. No current fracture.  Bridging spurring anteriorly at T11-T12-L1. Paraspinal and other soft tissues: Indistinct pancreatic tail compatible with mass, abutting the spleen. Aortoiliac atherosclerotic vascular disease. Disc levels: Facet and uncinate spurring contribute to osseous foraminal narrowing bilaterally at L4-5 and L5-S1, and potentially on the left at L2-3 and L3-4 as well. IMPRESSION: 1. New sclerotic metastatic lesions in the lumbar spine and bony pelvis, without fracture or  vertebral subluxation. 2. Indistinct pancreatic tail compatible with mass, abutting the spleen. 3. Lumbar spondylosis and degenerative disc disease causing osseous foraminal narrowing bilaterally at L4-5 and L5-S1, and potentially on the left at L2-3 and L3-4 as well. 4. Aortic atherosclerosis. Aortic Atherosclerosis (ICD10-I70.0). Electronically Signed   By: Van Clines M.D.   On: 06/02/2020 19:47   MR THORACIC SPINE WO CONTRAST  Result Date: 06/02/2020 CLINICAL DATA:  Mid back pain with bilateral leg weakness. Metastatic pancreatic cancer. EXAM: MRI THORACIC SPINE WITHOUT CONTRAST (LIMITED) TECHNIQUE: Coronal and sagittal localizing images the spine were obtained. The patient was not able to complete the examination. No axial imaging was performed. No intravenous contrast was administered. COMPARISON:  Chest radiographs 05/28/2020 and chest CTA 04/20/2020. FINDINGS: Alignment:  Normal. Vertebrae: Widespread osseous metastatic disease. In the thoracic spine, there are lesions within the T1, T2, T3, T4, T5 and T6 vertebral bodies. There is involvement of the posterior elements at T1, T2 and T3. No significant pathologic fracture identified, although a small amount of epidural tumor difficult to exclude at T3. Metastases are also present within the L1 and L5 vertebral bodies. Cord:  No gross cord compression on localizing images. IMPRESSION: 1. Extremely limited study terminated after the scout images were obtained. 2. Widespread osseous metastatic disease throughout the thoracic spine as described. No significant pathologic fracture identified, although a small amount of epidural tumor difficult to exclude at T3. 3. No gross cord compression on localizing images. 4. Consider further evaluation with CT or repeat MR after appropriate sedation. Electronically Signed   By: Richardean Sale M.D.   On: 06/02/2020 17:57   DG FEMUR, MIN 2 VIEWS RIGHT  Result Date: 06/02/2020 CLINICAL DATA:  Clot in right leg  EXAM: RIGHT FEMUR 2 VIEWS COMPARISON:  None. FINDINGS: There is no evidence of fracture or other focal bone lesions. Soft tissues are unremarkable. IMPRESSION:  Negative. Electronically Signed   By: Donavan Foil M.D.   On: 06/02/2020 19:34    Pending Labs Unresulted Labs (From admission, onward) Comment          Start     Ordered   06/06/20 0500  CBC  Every third day,   R      06/02/20 2315   06/03/20 0600  Basic metabolic panel  Tomorrow morning,   R        06/02/20 2207   06/03/20 0500  CBC  Tomorrow morning,   R        06/02/20 2207   06/03/20 0023  Lactic acid, plasma  STAT Now then every 3 hours,   R (with STAT occurrences)      06/03/20 0022   06/02/20 2206  HIV Antibody (routine testing w rflx)  (HIV Antibody (Routine testing w reflex) panel)  Once,   STAT        06/02/20 2207   06/02/20 1614  Blood culture (routine x 2)  BLOOD CULTURE X 2,   STAT      06/02/20 1613          Vitals/Pain Today's Vitals   06/02/20 2029 06/02/20 2100 06/02/20 2256 06/02/20 2257  BP: (!) 137/94 137/90 (!) 153/99   Pulse: 87 (!) 133 89 89  Resp: 13 14 16    Temp:      TempSrc:      SpO2: 98% 97% 94% 94%  Weight:      Height:      PainSc:        Isolation Precautions No active isolations  Medications Medications  morphine 2 MG/ML injection 1 mg (has no administration in time range)  enoxaparin (LOVENOX) injection 105 mg (105 mg Subcutaneous Given 06/03/20 0020)  fentaNYL (DURAGESIC) 12 MCG/HR 1 patch (has no administration in time range)  losartan (COZAAR) tablet 25 mg (has no administration in time range)  lipase/protease/amylase (CREON) capsule 24,000 Units (has no administration in time range)  dronabinol (MARINOL) capsule 5 mg (has no administration in time range)  metoCLOPramide (REGLAN) tablet 5 mg (has no administration in time range)  gabapentin (NEURONTIN) capsule 300 mg (has no administration in time range)  HYDROcodone-homatropine (HYCODAN) 5-1.5 MG/5ML syrup 5 mL (has no  administration in time range)  HYDROcodone-acetaminophen (NORCO/VICODIN) 5-325 MG per tablet 1-2 tablet (has no administration in time range)  insulin aspart (novoLOG) injection 0-15 Units (has no administration in time range)  insulin aspart (novoLOG) injection 0-5 Units (has no administration in time range)  LORazepam (ATIVAN) injection 1 mg (1 mg Intravenous Given 06/02/20 1702)  diltiazem (CARDIZEM) injection 10 mg (10 mg Intravenous Given 06/02/20 1705)  potassium chloride 10 mEq in 100 mL IVPB (0 mEq Intravenous Stopped 06/02/20 2303)  potassium chloride SA (KLOR-CON) CR tablet 40 mEq (40 mEq Oral Given 06/02/20 1837)  sodium chloride 0.9 % bolus 1,000 mL (0 mLs Intravenous Stopped 06/02/20 2303)  dexamethasone (DECADRON) injection 10 mg (10 mg Intravenous Given 06/02/20 2303)    Mobility walks

## 2020-06-03 NOTE — Progress Notes (Signed)
PROGRESS NOTE    Melvin [name]  FTD:[id number] DOB: Dec 17, 1961 DOA: 06/02/2020 PCP: Emeterio Reeve, DO   Chief Complaint  Patient presents with  . Blood clot  . Nausea   Brief Narrative:  Melvin [name] is [name] 59 y.o. male with medical history significant for Pancreatic cancer with metastasis to liver, lung and lymph nodes, recent PE and DVT, type 2 diabetes and hyperlipidemia who presents with recurrent falls and bilateral lower extremity weakness.  Wife at bedside provides most of the history as patient was fatigued and asleep during most of the evaluation.  She reports that for the past several weeks he has continued to have recurrent falls due to bilateral lower extremity weakness.  She had to go out and buy him Maple Odaniel walker and even with that he was continuing to have trouble keeping his balance.  He reports his right leg being weaker and also feels numbness to the entire right extremity below the hip.  He fell in the bathtub this past week and has had ankle swelling.  Denies any saddle anesthesia or bowel or bladder incontinence.  Wife also reports that he had Abbegayle Denault fever up to 102 two days ago after receiving Zeda Gangwer Lovenox shot but she just gave him 2 Tylenols and he has not had any fever since.  Denies any coughing or runny nose.  He has chronic shortness of breath due to metastasis of cancer to his lungs.  Denies any nausea, vomiting or diarrhea.  His last bowel movement was  2 days ago.  Reportedly patient was started on Xarelto for Violet Seabury saddle embolus pulmonary embolism found on 04/20/2020.  He was also diagnosed with an extensive right DVT on 6/11 and started on Lovenox last week.  However wife has been doing both Lovenox and Xarelto.  She denies noticing any bleeding.  In the ED, he was afebrile, mildly hypertensive on room air. No leukocytosis.  Has stable anemia.  K of 2.9.  Glucose of 256.  AST elevated 146 and ALT of 174 with alkaline phosphatase of 522 which appears to be chronic for  him. Lactate of 2.7.  CT lumbar spine shows new sclerotic metastatic lesion in the lumbar spine and bony pelvis without any fracture or subluxation.  There is also lumbar spondylosis and DDD causing osseous foraminal narrowing bilaterally at L4-5 and L5-S1 potentially on the left L2-3 and L3-4.  CT thoracic shows widespread sclerotic osseous metastatic disease in the thoracic spine.  Patient not able to tolerate MRI due to anxiety and refused.   Assessment & Plan:   Principal Problem:   Lower extremity weakness Active Problems:   Type 2 diabetes mellitus with diabetic polyneuropathy, without long-term current use of insulin (HCC)   DVT femoral (deep venous thrombosis) with thrombophlebitis, right (HCC)   Primary pancreatic cancer with metastasis to other site [facility])   Hypokalemia  Bilateral lower extremity weakness  Concern for Cord Compression in Setting of Metastatic Disease:  CT L and T spine with widespread sclerotic osseous metastatic disease in thoracic spine as well as new sclerotic metastatic lesions in the lumbar spine and bony pelvis did not tolerate MRI yesterday, will try again with higher dose of ativan tonight Continue decadron IV Appreciate oncology recommendations - planning to discuss with Mr. Abdallah and wife again tomorrow  History of PE/recent DVT Wife was doing both Xarelto and Lovenox.  Will only continue Lovenox for now since he appeared to have failed Xarelto and developed DVT while on it.  Tachycardia: cardiology c/s,  appreciate recs.  Sinus tach with PVC's on EKG.  Cardiology c/s, appreciate recs TSH wnl Echo with EF 64-40%, grade 1 diastolic dysfunction   Type 2 diabetes Basal and bolus insulin Adjust with steroids A1c 04/2020 8.9  Hypokalemia Repleted.  Will recheck in the morning.  Elevated lactate Resolved  History of metastatic pancreatic cancer Continue Creon, Marinol  Elevated LFTs: likely related to metastatic disease  DVT  prophylaxis: lovenox Code Status: full  Family Communication: none at bedside Disposition:   Status is: Inpatient  Remains inpatient appropriate because:Inpatient level of care appropriate due to severity of illness   Dispo: The patient is from: Home              Anticipated d/c is to: Home              Anticipated d/c date is: > 3 days              Patient currently is not medically stable to d/c.   Consultants:   oncology  Procedures:  Echo IMPRESSIONS    1. Left ventricular ejection fraction, by estimation, is 70 to 75%. The  left ventricle has hyperdynamic function. The left ventricle has no  regional wall motion abnormalities. Left ventricular diastolic parameters  are consistent with Grade I diastolic  dysfunction (impaired relaxation).  2. Right ventricular systolic function is hyperdynamic. The right  ventricular size is normal.  3. Left atrial size was mild to moderately dilated.  4. The mitral valve is normal in structure. Trivial mitral valve  regurgitation.  5. The aortic valve is normal in structure. Aortic valve regurgitation is  not visualized.  6. Aortic dilatation noted. There is borderline dilatation of the aortic  root and of the ascending aorta.  7. The inferior vena cava is normal in size with greater than 50%  respiratory variability, suggesting right atrial pressure of 3 mmHg.   Antimicrobials:  Anti-infectives (From admission, onward)   None     Subjective: Can't walk, can't hold himself up  Objective: Vitals:   06/03/20 0135 06/03/20 0542 06/03/20 1341 06/03/20 1642  BP: (!) 170/119 (!) 154/101 (!) 138/103 139/90  Pulse: 89 92 (!) 133 (!) 131  Resp:  18 16 20   Temp: 97.7 F (36.5 C) (!) 97.4 F (36.3 C) 98.5 F (36.9 C) 98.1 F (36.7 C)  TempSrc: Oral Oral Oral Oral  SpO2: 96% 94% 98% 96%  Weight: 107.3 kg     Height: 6\' 3"  (1.905 m)       Intake/Output Summary (Last 24 hours) at 06/03/2020 1737 Last data filed at  06/03/2020 1655 Gross per 24 hour  Intake 1400 ml  Output 1275 ml  Net 125 ml   Filed Weights   06/02/20 1544 06/03/20 0135  Weight: 104.8 kg 107.3 kg    Examination:  General exam: Appears calm and comfortable  Respiratory system: Clear to auscultation. Respiratory effort normal. Cardiovascular system: S1 & S2 heard, RRR.  Gastrointestinal system: Abdomen is nondistended, soft and nontender. Central nervous system: Alert and oriented. Bilateral lower extremity weakness, 3/5 hip flexor strength, bilateral decreased sensation to light touch below knees.  Extremities: dopplerable pulses bilaterally distally per RN Skin: No rashes, lesions or ulcers Psychiatry: Judgement and insight appear normal. Mood & affect appropriate.     Data Reviewed: I have personally reviewed following labs and imaging studies  CBC: Recent Labs  Lab 05/28/20 0950 06/02/20 1047 06/02/20 1612 06/02/20 1701 06/03/20 0200  WBC 7.0 4.4 3.9*  --  2.1*  NEUTROABS 5.0 2.9 2.4  --   --   HGB 11.3* 10.9* 10.9* 10.9* 10.6*  HCT 36.5* 36.2* 35.1* 32.0* 33.5*  MCV 97.6 97.8 98.6  --  96.0  PLT 226 196 197  --  139*    Basic Metabolic Panel: Recent Labs  Lab 05/28/20 0950 06/02/20 1047 06/02/20 1612 06/02/20 1701 06/03/20 0200  NA 142 142 143 144 139  K 3.4* 3.2* 2.9* 2.7* 3.8  CL 102 101 105 100 100  CO2 29 33* 30  --  29  GLUCOSE 305* 397* 256* 229* 302*  BUN 9 12 12 10 10   CREATININE 0.55* 0.60* 0.47* 0.40* 0.48*  CALCIUM 8.3* 8.4* 7.8*  --  7.8*    GFR: Estimated Creatinine Clearance: 134.9 mL/min (Cristi Gwynn) (by C-G formula based on SCr of 0.48 mg/dL (L)).  Liver Function Tests: Recent Labs  Lab 05/28/20 0950 06/02/20 1612  AST 51* 146*  ALT 62* 174*  ALKPHOS 707* 522*  BILITOT 1.2 1.0  PROT 5.6* 5.7*  ALBUMIN 3.0* 2.5*    CBG: Recent Labs  Lab 06/03/20 0140 06/03/20 0723 06/03/20 1121 06/03/20 1639  GLUCAP 299* 341* 337* 298*     Recent Results (from the past 240 hour(s))   Culture, Urine     Status: Abnormal   Collection Time: 06/02/20  1:49 PM   Specimen: Urine, Clean Catch  Result Value Ref Range Status   Specimen Description   Final    URINE, CLEAN CATCH Performed at [facility] Lab at [facility], [address], [place], [place] [address]    Special Requests   Final    NONE Performed at [facility] at [facility], [address] [place], [place] [address]    Culture (Brianny Soulliere)  Final    <10,000 COLONIES/mL INSIGNIFICANT GROWTH Performed at [facility], [place] [address], [place], [place] [address]    Report Status 06/03/2020 FINAL  Final  Blood culture (routine x 2)     Status: None (Preliminary result)   Collection Time: 06/02/20  4:50 PM   Specimen: BLOOD  Result Value Ref Range Status   Specimen Description   Final    BLOOD RIGHT ANTECUBITAL Performed at [facility] [address] [place], [place] [address]    Special Requests   Final    BOTTLES DRAWN AEROBIC AND ANAEROBIC Blood Culture results may not be optimal due to an excessive volume of blood received in culture bottles Performed at [facility] [address], [place], [place] [address]    Culture   Final    NO GROWTH < 12 HOURS Performed at [facility] [address] [place], [place] [address]    Report Status PENDING  Incomplete  SARS Coronavirus 2 by RT PCR (hospital order, performed in [facility] hospital lab) Nasopharyngeal Nasopharyngeal Swab     Status: None   Collection Time: 06/02/20  5:22 PM   Specimen: Nasopharyngeal Swab  Result Value Ref Range Status   SARS Coronavirus 2 NEGATIVE NEGATIVE Final    Comment: (NOTE) SARS-CoV-2 target nucleic acids are NOT DETECTED.  The SARS-CoV-2 RNA is generally detectable in upper and lower respiratory specimens during the acute phase of infection. The lowest concentration of SARS-CoV-2 viral copies this assay can detect is  250 copies / mL. Jurni Cesaro negative result does not preclude SARS-CoV-2 infection and should not be used as the sole basis for  treatment or other patient management decisions.  Merlin Golden negative result may occur with improper specimen collection / handling, submission of specimen other than nasopharyngeal swab, presence of viral mutation(s) within the areas targeted by this assay, and inadequate number of viral copies (<250 copies / mL). Phyllis Whitefield negative result must be combined with clinical observations, patient history, and epidemiological information.  Fact Sheet for Patients:   StrictlyIdeas.no  Fact Sheet for Healthcare Providers: BankingDealers.co.za  This test is not yet approved or  cleared by the [place] FDA and has been authorized for detection and/or diagnosis of SARS-CoV-2 by FDA under an Emergency Use Authorization (EUA).  This EUA will remain in effect (meaning this test can be used) for the duration of the COVID-19 declaration under Section 564(b)(1) of the Act, 21 U.S.C. section 360bbb-3(b)(1), unless the authorization is terminated or revoked sooner.  Performed at [facility], [place] [address] [place], [place] [address]   Blood culture (routine x 2)     Status: None (Preliminary result)   Collection Time: 06/02/20  6:02 PM   Specimen: BLOOD  Result Value Ref Range Status   Specimen Description   Final    BLOOD LEFT ANTECUBITAL Performed at [facility] [address], [place], [place] [address]    Special Requests   Final    BOTTLES DRAWN AEROBIC AND ANAEROBIC Blood Culture adequate volume Performed at [facility] [address], [place], [place] [address]    Culture   Final    NO GROWTH < 12 HOURS Performed at [facility] [address] [place], [place] [address]    Report Status PENDING  Incomplete         Radiology Studies: CT Thoracic Spine Wo  Contrast  Result Date: 06/02/2020 CLINICAL DATA:  Metastatic pancreatic adenocarcinoma. Back pain and bilateral leg weakness. EXAM: CT THORACIC SPINE WITHOUT CONTRAST TECHNIQUE: Multidetector CT images of the thoracic were obtained using the standard protocol without intravenous contrast. COMPARISON:  MRI thoracic spine from 06/02/2020 FINDINGS: Alignment: No vertebral subluxation is observed. Vertebrae: Widespread osseous metastatic disease in the thoracic spine. Index left eccentric lesion at T3 measures 2.5 by 1.8 by 2.2 cm. Occasional rib lesions noted. Posterior element involvement at multiple levels notably at T1, T2, T3, and T4. Paraspinal and other soft tissues: Miliary nodules throughout the lungs probably from widespread metastatic disease to the lungs. If the patient is immunocompromised than miliary infection might also be considered. Small to moderate left and small right pleural effusions. Right [place]-Cath tip: Cavoatrial junction. Aortic and coronary atherosclerosis. Scattered hypodense lesions in the liver compatible with metastatic disease. Pancreatic tail mass. Suspected perihepatic ascites. CT is not considered sensitive for epidural tumor. Disc levels: No significant osseous foraminal narrowing is identified in the thoracic spine. IMPRESSION: 1. Widespread sclerotic osseous metastatic disease in the thoracic spine. Please note that today's exam is not considered sensitive in ruling out epidural tumor. 2. Miliary nodules throughout the lungs probably from widespread metastatic disease to the lungs. If the patient is immunocompromised than miliary infection might also be considered. 3. Hepatic metastatic disease. 4. Pancreatic tail mass. 5. Small to moderate left and small right pleural effusions. 6. Suspected perihepatic ascites. 7. Aortic atherosclerosis. Aortic Atherosclerosis (ICD10-I70.0). Electronically Signed   By: Van Clines M.D.   On: 06/02/2020 19:53   CT Lumbar Spine Wo  Contrast  Result Date: 06/02/2020 CLINICAL DATA:  Back pain. Bilateral leg weakness, right greater than left. Metastatic pancreatic cancer.  EXAM: CT LUMBAR SPINE WITHOUT CONTRAST TECHNIQUE: Multidetector CT imaging of the lumbar spine was performed without intravenous contrast administration. Multiplanar CT image reconstructions were also generated. COMPARISON:  03/08/2020 CT abdomen FINDINGS: Segmentation: The lowest lumbar type non-rib-bearing vertebra is labeled as L5. Alignment: No vertebral subluxation is observed. Vertebrae: Scattered new sclerotic metastatic lesions in the lumbar spine and bony pelvis. These include Frederick Marro 2.4 by 2.3 by 2.8 cm sclerotic lesion anteriorly in the L1 vertebral body; Najla Aughenbaugh 4.0 by 3.3 by 3.4 cm sclerotic lesion eccentric to the right in the L5 vertebral body, and multiple sclerotic lesions in the visualized iliac bones and sacrum, as well as some other smaller vertebral lesions. No current fracture.  Bridging spurring anteriorly at T11-T12-L1. Paraspinal and other soft tissues: Indistinct pancreatic tail compatible with mass, abutting the spleen. Aortoiliac atherosclerotic vascular disease. Disc levels: Facet and uncinate spurring contribute to osseous foraminal narrowing bilaterally at L4-5 and L5-S1, and potentially on the left at L2-3 and L3-4 as well. IMPRESSION: 1. New sclerotic metastatic lesions in the lumbar spine and bony pelvis, without fracture or vertebral subluxation. 2. Indistinct pancreatic tail compatible with mass, abutting the spleen. 3. Lumbar spondylosis and degenerative disc disease causing osseous foraminal narrowing bilaterally at L4-5 and L5-S1, and potentially on the left at L2-3 and L3-4 as well. 4. Aortic atherosclerosis. Aortic Atherosclerosis (ICD10-I70.0). Electronically Signed   By: Van Clines M.D.   On: 06/02/2020 19:47   MR THORACIC SPINE WO CONTRAST  Result Date: 06/02/2020 CLINICAL DATA:  Mid back pain with bilateral leg weakness. Metastatic  pancreatic cancer. EXAM: MRI THORACIC SPINE WITHOUT CONTRAST (LIMITED) TECHNIQUE: Coronal and sagittal localizing images the spine were obtained. The patient was not able to complete the examination. No axial imaging was performed. No intravenous contrast was administered. COMPARISON:  Chest radiographs 05/28/2020 and chest CTA 04/20/2020. FINDINGS: Alignment:  Normal. Vertebrae: Widespread osseous metastatic disease. In the thoracic spine, there are lesions within the T1, T2, T3, T4, T5 and T6 vertebral bodies. There is involvement of the posterior elements at T1, T2 and T3. No significant pathologic fracture identified, although Sidrah Harden small amount of epidural tumor difficult to exclude at T3. Metastases are also present within the L1 and L5 vertebral bodies. Cord:  No gross cord compression on localizing images. IMPRESSION: 1. Extremely limited study terminated after the scout images were obtained. 2. Widespread osseous metastatic disease throughout the thoracic spine as described. No significant pathologic fracture identified, although Maida Widger small amount of epidural tumor difficult to exclude at T3. 3. No gross cord compression on localizing images. 4. Consider further evaluation with CT or repeat MR after appropriate sedation. Electronically Signed   By: Richardean Sale M.D.   On: 06/02/2020 17:57   ECHOCARDIOGRAM COMPLETE  Result Date: 06/03/2020    ECHOCARDIOGRAM REPORT   Patient Name:   JARAD BARTH Date of Exam: 06/03/2020 Medical Rec #:  [id number]   Height:       75.0 in Accession #:    [id number]  Weight:       236.6 lb Date of Birth:  13-Jan-1961  BSA:          2.357 m Patient Age:    47 years    BP:           154/101 mmHg Patient Gender: M           HR:           136 bpm. Exam Location:  Inpatient Procedure: 2D Echo, Cardiac Doppler and  Color Doppler Indications:    Tachycardia  History:        Patient has no prior history of Echocardiogram examinations.                 Risk Factors:Diabetes, Dyslipidemia and  Former Smoker. DVT.  Sonographer:    Vickie Epley RDCS Referring Phys: [phone] Braydin Aloi CALDWELL Griffin  1. Left ventricular ejection fraction, by estimation, is 70 to 75%. The left ventricle has hyperdynamic function. The left ventricle has no regional wall motion abnormalities. Left ventricular diastolic parameters are consistent with Grade I diastolic dysfunction (impaired relaxation).  2. Right ventricular systolic function is hyperdynamic. The right ventricular size is normal.  3. Left atrial size was mild to moderately dilated.  4. The mitral valve is normal in structure. Trivial mitral valve regurgitation.  5. The aortic valve is normal in structure. Aortic valve regurgitation is not visualized.  6. Aortic dilatation noted. There is borderline dilatation of the aortic root and of the ascending aorta.  7. The inferior vena cava is normal in size with greater than 50% respiratory variability, suggesting right atrial pressure of 3 mmHg. FINDINGS  Left Ventricle: Left ventricular ejection fraction, by estimation, is 70 to 75%. The left ventricle has hyperdynamic function. The left ventricle has no regional wall motion abnormalities. The left ventricular internal cavity size was normal in size. There is no left ventricular hypertrophy. Left ventricular diastolic parameters are consistent with Grade I diastolic dysfunction (impaired relaxation). Right Ventricle: The right ventricular size is normal. No increase in right ventricular wall thickness. Right ventricular systolic function is hyperdynamic. Left Atrium: Left atrial size was mild to moderately dilated. Right Atrium: Right atrial size was normal in size. Pericardium: There is no evidence of pericardial effusion. Mitral Valve: The mitral valve is normal in structure. Trivial mitral valve regurgitation. Tricuspid Valve: The tricuspid valve is normal in structure. Tricuspid valve regurgitation is trivial. Aortic Valve: The aortic valve is normal in  structure. Aortic valve regurgitation is not visualized. Pulmonic Valve: The pulmonic valve was normal in structure. Pulmonic valve regurgitation is trivial. Aorta: Aortic dilatation noted. There is borderline dilatation of the aortic root and of the ascending aorta. Venous: The inferior vena cava is normal in size with greater than 50% respiratory variability, suggesting right atrial pressure of 3 mmHg. IAS/Shunts: No atrial level shunt detected by color flow Doppler.  LEFT VENTRICLE PLAX 2D LVIDd:         4.23 cm LVIDs:         3.17 cm LV PW:         0.96 cm LV IVS:        0.98 cm LVOT diam:     2.30 cm LV SV:         52 LV SV Index:   22 LVOT Area:     4.15 cm  LV Volumes (MOD) LV vol d, MOD A2C: 77.1 ml LV vol d, MOD A4C: 78.5 ml LV vol s, MOD A2C: 34.9 ml LV vol s, MOD A4C: 34.1 ml LV SV MOD A2C:     42.2 ml LV SV MOD A4C:     78.5 ml LV SV MOD BP:      46.0 ml RIGHT VENTRICLE RV S prime:     12.40 cm/s TAPSE (M-mode): 1.5 cm LEFT ATRIUM             Index       RIGHT ATRIUM  Index LA diam:        3.90 cm 1.65 cm/m  RA Area:     13.90 cm LA Vol (A2C):   47.6 ml 20.20 ml/m RA Volume:   28.60 ml  12.14 ml/m LA Vol (A4C):   46.3 ml 19.65 ml/m LA Biplane Vol: 47.7 ml 20.24 ml/m  AORTIC VALVE LVOT Vmax:   81.40 cm/s LVOT Vmean:  60.100 cm/s LVOT VTI:    0.125 m  AORTA Ao Root diam: 4.20 cm Ao Asc diam:  4.10 cm  SHUNTS Systemic VTI:  0.12 m Systemic Diam: 2.30 cm Glori Bickers MD Electronically signed by Glori Bickers MD Signature Date/Time: 06/03/2020/2:39:21 PM    Final    DG FEMUR, MIN 2 VIEWS RIGHT  Result Date: 06/02/2020 CLINICAL DATA:  Clot in right leg EXAM: RIGHT FEMUR 2 VIEWS COMPARISON:  None. FINDINGS: There is no evidence of fracture or other focal bone lesions. Soft tissues are unremarkable. IMPRESSION: Negative. Electronically Signed   By: Donavan Foil M.D.   On: 06/02/2020 19:34        Scheduled Meds: . Chlorhexidine Gluconate Cloth  6 each Topical Daily  .  dexamethasone (DECADRON) injection  4 mg Intravenous Q6H  . dronabinol  5 mg Oral BID AC  . enoxaparin (LOVENOX) injection  105 mg Subcutaneous BID  . [START ON 06/05/2020] fentaNYL  1 patch Transdermal Q72H  . gabapentin  300 mg Oral TID  . insulin aspart  0-15 Units Subcutaneous TID WC  . insulin aspart  0-5 Units Subcutaneous QHS  . insulin aspart  3 Units Subcutaneous TID WC  . insulin glargine  12 Units Subcutaneous QHS  . lipase/protease/amylase  24,000 Units Oral TID AC  . metoprolol tartrate  25 mg Oral Q6H  . sodium chloride flush  10-40 mL Intracatheter Q12H   Continuous Infusions: . diltiazem (CARDIZEM) infusion Stopped (06/03/20 0814)     LOS: 1 day    Time spent: over 30 min    Fayrene Helper, MD [facility]   To contact the attending provider between 7A-7P or the covering provider during after hours 7P-7A, please log into the web site [email or web address] and access using universal [place] password for that web site. If you do not have the password, please call the hospital operator.  06/03/2020, 5:37 PM

## 2020-06-03 NOTE — Progress Notes (Signed)
PT Cancellation Note  Patient Details Name: Melvin [name] MRN: [id number] DOB: 27-Jan-1961   Cancelled Treatment:    Reason Eval/Treat Not Completed: Medical issues which prohibited therapy  Pt with fluctuating HR, up to 150s at times per RN.  RN reports to hold therapy today.  Will check back as schedule permits.  Jahaira Earnhart,KATHrine E 06/03/2020, 11:25 AM Arlyce Dice, DPT Acute Rehabilitation Services Pager: [phone] Office: [phone]

## 2020-06-03 NOTE — H&P (Addendum)
History and Physical    Melvin [name] FGH:[id number] DOB: 02/15/1961 DOA: 06/02/2020  PCP: Emeterio Reeve, DO  Patient coming from: Home, accompanied by wife at bedside  I have personally briefly reviewed patient's old medical records in [place]  Chief Complaint: Recurrent falls and bilateral lower extremity weakness  HPI: Melvin [name] is a 59 y.o. male with medical history significant for Pancreatic cancer with metastasis to liver, lung and lymph nodes, recent PE and DVT, type 2 diabetes and hyperlipidemia who presents with recurrent falls and bilateral lower extremity weakness.  Wife at bedside provides most of the history as patient was fatigued and asleep during most of the evaluation.  She reports that for the past several weeks he has continued to have recurrent falls due to bilateral lower extremity weakness.  She had to go out and buy him a walker and even with that he was continuing to have trouble keeping his balance.  He reports his right leg being weaker and also feels numbness to the entire right extremity below the hip.  He fell in the bathtub this past week and has had ankle swelling.  Denies any saddle anesthesia or bowel or bladder incontinence.  Wife also reports that he had a fever up to 102 two days ago after receiving a Lovenox shot but she just gave him 2 Tylenols and he has not had any fever since.  Denies any coughing or runny nose.  He has chronic shortness of breath due to metastasis of cancer to his lungs.  Denies any nausea, vomiting or diarrhea.  His last bowel movement was  2 days ago.  Reportedly patient was started on Xarelto for a saddle embolus pulmonary embolism found on 04/20/2020.  He was also diagnosed with an extensive right DVT on 6/11 and started on Lovenox last week.  However wife has been doing both Lovenox and Xarelto.  She denies noticing any bleeding.  In the ED, he was afebrile, mildly hypertensive on room air. No leukocytosis.  Has stable  anemia.  K of 2.9.  Glucose of 256.  AST elevated 146 and ALT of 174 with alkaline phosphatase of 522 which appears to be chronic for him. Lactate of 2.7.  CT lumbar spine shows new sclerotic metastatic lesion in the lumbar spine and bony pelvis without any fracture or subluxation.  There is also lumbar spondylosis and DDD causing osseous foraminal narrowing bilaterally at L4-5 and L5-S1 potentially on the left L2-3 and L3-4.  CT thoracic shows widespread sclerotic osseous metastatic disease in the thoracic spine.  Patient not able to tolerate MRI due to anxiety and refused.   Review of Systems:  Constitutional: No Weight Change, No Fever ENT/Mouth: No sore throat, No Rhinorrhea Eyes: No Eye Pain, No Vision Changes Cardiovascular: No Chest Pain, + SOB Respiratory: No Cough, No Sputum, No Wheezing, no Dyspnea  Gastrointestinal: No Nausea, No Vomiting, No Diarrhea, No Constipation, No Pain Genitourinary: no Urinary Incontinence, No Urgency, No Flank Pain Musculoskeletal: No Arthralgias, No Myalgias Skin: No Skin Lesions, No Pruritus, Neuro: + Weakness, + Numbness,  No Loss of Consciousness, No Syncope Psych: No Anxiety/Panic, No Depression, no decrease appetite Heme/Lymph: No Bruising, No Bleeding  Past Medical History:  Diagnosis Date  . Constipation   . Diabetes mellitus without complication ([place])   . DVT femoral (deep venous thrombosis) with thrombophlebitis, right ([place]) 05/28/2020  . Dyspnea   . High cholesterol   . Hypertension   . Multiple pulmonary emboli ([place]) 05/28/2020  . Pancreatic  cancer metastasized to liver ([place]) 03/12/2020  . Pancreatic cancer metastasized to lung [facility]) 03/12/2020    Past Surgical History:  Procedure Laterality Date  . IR IMAGING GUIDED PORT INSERTION  03/19/2020  . NO PAST SURGERIES       reports that he quit smoking about 34 years ago. His smoking use included cigarettes. He has never used smokeless tobacco. He reports previous drug use. No history  on file for alcohol use.  No Known Allergies  No family history on file.   Prior to Admission medications   Medication Sig Start Date End Date Taking? Authorizing Provider  CREON 24000-76000 units CPEP TAKE 3 CAPSULE BY MOUTH IN THE MORNING, NOON, AND AT NIGHT AT BEDTIME Patient taking differently: Take 2 capsules by mouth in the morning, at noon, and at bedtime.  05/28/20  Yes Ennever, Rudell Cobb, MD  dexamethasone (DECADRON) 4 MG tablet Take 4 mg by mouth 2 (two) times daily with a meal. Takes 2 tabs PO daily, start day after chemo x 3 days with food.   Yes [provider]  dronabinol (MARINOL) 5 MG capsule Take 1 capsule (5 mg total) by mouth 2 (two) times daily before lunch and supper. 04/30/20  Yes Volanda Napoleon, MD  enoxaparin (LOVENOX) 120 MG/0.8ML injection Inject 0.67 mLs (100 mg total) into the skin every 12 (twelve) hours. 05/28/20  Yes Ennever, Rudell Cobb, MD  fentaNYL (DURAGESIC) 12 MCG/HR Place 1 patch onto the skin every 3 (three) days. 05/07/20  Yes Volanda Napoleon, MD  fluconazole (DIFLUCAN) 100 MG tablet Take 1 tablet (100 mg total) by mouth daily. 05/07/20  Yes Volanda Napoleon, MD  gabapentin (NEURONTIN) 300 MG capsule Take 1 capsule (300 mg total) by mouth 3 (three) times daily. 04/16/20  Yes Ennever, Rudell Cobb, MD  HYDROcodone-acetaminophen (NORCO/VICODIN) 5-325 MG tablet Take 1-2 tablets by mouth every 6 (six) hours as needed for moderate pain or severe pain. 04/30/20  Yes Ennever, Rudell Cobb, MD  HYDROcodone-homatropine [facility]) 5-1.5 MG/5ML syrup Take 5 mLs by mouth every 6 (six) hours as needed for cough. 05/07/20  Yes Ennever, Rudell Cobb, MD  insulin aspart (NOVOLOG FLEXPEN) 100 UNIT/ML FlexPen Inject 15 Units into the skin 3 (three) times daily with meals. 02/16/20  Yes Emeterio Reeve, DO  loperamide (IMODIUM A-D) 2 MG capsule Take 1 capsule (2 mg total) by mouth as needed for diarrhea or loose stools. Take 2 tablets at onset of diarrhea, then 1 every 2 hours until 12 hours  without a BM. 03/19/20  Yes Ennever, Rudell Cobb, MD  LORazepam (ATIVAN) 0.5 MG tablet TAKE 1 TABLET BY MOUTH EVERY 8 HOURS AS NEEDED FOR NAUSEA 05/28/20  Yes Ennever, Rudell Cobb, MD  losartan (COZAAR) 25 MG tablet Take 1 tablet (25 mg total) by mouth daily. 04/13/20  Yes Emeterio Reeve, DO  metFORMIN (GLUCOPHAGE) 1000 MG tablet Take 1 tablet (1,000 mg total) by mouth 2 (two) times daily with a meal. Patient taking differently: Take 1,000 mg by mouth daily with breakfast.  04/13/20  Yes Emeterio Reeve, DO  metoCLOPramide (REGLAN) 10 MG tablet Take 0.5 tablets (5 mg total) by mouth as needed for nausea (nausea/reflux). 02/16/20  Yes Emeterio Reeve, DO  ondansetron (ZOFRAN-ODT) 8 MG disintegrating tablet Take 1 tablet (8 mg total) by mouth every 8 (eight) hours as needed for nausea. 03/08/20  Yes Emeterio Reeve, DO  prochlorperazine (COMPAZINE) 10 MG tablet Take 1 tablet (10 mg total) by mouth every 6 (six) hours as needed for  nausea or vomiting. 04/16/20  Yes Ennever, Rudell Cobb, MD  rivaroxaban (XARELTO) 20 MG TABS tablet Take 1 tablet (20 mg total) by mouth daily with supper. 05/05/20  Yes Volanda Napoleon, MD  atorvastatin (LIPITOR) 10 MG tablet Take 1 tablet (10 mg total) by mouth daily. Patient not taking: Reported on 06/02/2020 04/13/20   Emeterio Reeve, DO  Insulin Pen Needle (PEN NEEDLES) 30G X 8 MM MISC 1 each by Does not apply route as directed. 02/16/20   Emeterio Reeve, DO  lidocaine-prilocaine (EMLA) cream Apply 1 application topically as needed. Use on portacath as directed approx 1-2 hours prior to chemotherapy 03/19/20   Volanda Napoleon, MD  ondansetron (ZOFRAN) 8 MG tablet Take 1 tablet (8 mg total) by mouth 2 (two) times daily. As needed.  Start on Day 3 after chemotherapy. Patient not taking: Reported on 06/02/2020 03/19/20   Volanda Napoleon, MD    Physical Exam: Vitals:   06/02/20 2029 06/02/20 2100 06/02/20 2256 06/02/20 2257  BP: (!) 137/94 137/90 (!) 153/99   Pulse: 87 (!) 133  89 89  Resp: 13 14 16    Temp:      TempSrc:      SpO2: 98% 97% 94% 94%  Weight:      Height:        Constitutional: NAD, calm, fatigued appearing male laying on his right side in bed Vitals:   06/02/20 2029 06/02/20 2100 06/02/20 2256 06/02/20 2257  BP: (!) 137/94 137/90 (!) 153/99   Pulse: 87 (!) 133 89 89  Resp: 13 14 16    Temp:      TempSrc:      SpO2: 98% 97% 94% 94%  Weight:      Height:       Eyes: PERRL, lids and conjunctivae normal ENMT: Mucous membranes are moist.  Neck: normal, supple Respiratory: clear to auscultation bilaterally, no wheezing, no crackles. Normal respiratory effort. No accessory muscle use.  Cardiovascular: Regular rate and rhythm, no murmurs / rubs / gallops.  Bilateral nonpitting lower extremity edema of the ankle.  2+ pedal pulses. Abdomen: no tenderness, no masses palpated.  Bowel sounds positive.  Musculoskeletal: no clubbing / cyanosis. No joint deformity upper and lower extremities.Normal muscle tone.  No step-off or pain with palpation of the spine. Skin: no rashes, lesions, ulcers. No induration Neurologic: CN 2-12 grossly intact. Sensation intact. Strength 4/5 in lower extremity.   Psychiatric: Normal judgment and insight. Alert and oriented x 3. Fatigue.   Labs on Admission: I have personally reviewed following labs and imaging studies  CBC: Recent Labs  Lab 05/28/20 0950 06/02/20 1047 06/02/20 1612 06/02/20 1701  WBC 7.0 4.4 3.9*  --   NEUTROABS 5.0 2.9 2.4  --   HGB 11.3* 10.9* 10.9* 10.9*  HCT 36.5* 36.2* 35.1* 32.0*  MCV 97.6 97.8 98.6  --   PLT 226 196 197  --    Basic Metabolic Panel: Recent Labs  Lab 05/28/20 0950 06/02/20 1047 06/02/20 1612 06/02/20 1701  NA 142 142 143 144  K 3.4* 3.2* 2.9* 2.7*  CL 102 101 105 100  CO2 29 33* 30  --   GLUCOSE 305* 397* 256* 229*  BUN 9 12 12 10   CREATININE 0.55* 0.60* 0.47* 0.40*  CALCIUM 8.3* 8.4* 7.8*  --    GFR: Estimated Creatinine Clearance: 134.4 mL/min (A) (by  C-G formula based on SCr of 0.4 mg/dL (L)). Liver Function Tests: Recent Labs  Lab 05/28/20 0950 06/02/20 1612  AST 51* 146*  ALT 62* 174*  ALKPHOS 707* 522*  BILITOT 1.2 1.0  PROT 5.6* 5.7*  ALBUMIN 3.0* 2.5*   Recent Labs  Lab 06/02/20 1612  LIPASE 17   No results for input(s): AMMONIA in the last 168 hours. Coagulation Profile: Recent Labs  Lab 06/02/20 1047  INR 1.6*   Cardiac Enzymes: No results for input(s): CKTOTAL, CKMB, CKMBINDEX, TROPONINI in the last 168 hours. BNP (last 3 results) No results for input(s): PROBNP in the last 8760 hours. HbA1C: No results for input(s): HGBA1C in the last 72 hours. CBG: No results for input(s): GLUCAP in the last 168 hours. Lipid Profile: No results for input(s): CHOL, HDL, LDLCALC, TRIG, CHOLHDL, LDLDIRECT in the last 72 hours. Thyroid Function Tests: No results for input(s): TSH, T4TOTAL, FREET4, T3FREE, THYROIDAB in the last 72 hours. Anemia Panel: No results for input(s): VITAMINB12, FOLATE, FERRITIN, TIBC, IRON, RETICCTPCT in the last 72 hours. Urine analysis:    Component Value Date/Time   COLORURINE YELLOW 06/02/2020 1348   APPEARANCEUR CLEAR 06/02/2020 1348   LABSPEC 1.025 06/02/2020 1348   PHURINE 6.0 06/02/2020 1348   GLUCOSEU >=500 (A) 06/02/2020 1348   HGBUR NEGATIVE 06/02/2020 [place] 06/02/2020 1348   BILIRUBINUR negative 02/16/2020 1333   KETONESUR NEGATIVE 06/02/2020 1348   PROTEINUR NEGATIVE 06/02/2020 1348   UROBILINOGEN 4.0 (A) 02/16/2020 1333   NITRITE NEGATIVE 06/02/2020 1348   LEUKOCYTESUR NEGATIVE 06/02/2020 1348    Radiological Exams on Admission: CT Thoracic Spine Wo Contrast  Result Date: 06/02/2020 CLINICAL DATA:  Metastatic pancreatic adenocarcinoma. Back pain and bilateral leg weakness. EXAM: CT THORACIC SPINE WITHOUT CONTRAST TECHNIQUE: Multidetector CT images of the thoracic were obtained using the standard protocol without intravenous contrast. COMPARISON:  MRI  thoracic spine from 06/02/2020 FINDINGS: Alignment: No vertebral subluxation is observed. Vertebrae: Widespread osseous metastatic disease in the thoracic spine. Index left eccentric lesion at T3 measures 2.5 by 1.8 by 2.2 cm. Occasional rib lesions noted. Posterior element involvement at multiple levels notably at T1, T2, T3, and T4. Paraspinal and other soft tissues: Miliary nodules throughout the lungs probably from widespread metastatic disease to the lungs. If the patient is immunocompromised than miliary infection might also be considered. Small to moderate left and small right pleural effusions. Right Port-A-Cath tip: Cavoatrial junction. Aortic and coronary atherosclerosis. Scattered hypodense lesions in the liver compatible with metastatic disease. Pancreatic tail mass. Suspected perihepatic ascites. CT is not considered sensitive for epidural tumor. Disc levels: No significant osseous foraminal narrowing is identified in the thoracic spine. IMPRESSION: 1. Widespread sclerotic osseous metastatic disease in the thoracic spine. Please note that today's exam is not considered sensitive in ruling out epidural tumor. 2. Miliary nodules throughout the lungs probably from widespread metastatic disease to the lungs. If the patient is immunocompromised than miliary infection might also be considered. 3. Hepatic metastatic disease. 4. Pancreatic tail mass. 5. Small to moderate left and small right pleural effusions. 6. Suspected perihepatic ascites. 7. Aortic atherosclerosis. Aortic Atherosclerosis (ICD10-I70.0). Electronically Signed   By: Van Clines M.D.   On: 06/02/2020 19:53   CT Lumbar Spine Wo Contrast  Result Date: 06/02/2020 CLINICAL DATA:  Back pain. Bilateral leg weakness, right greater than left. Metastatic pancreatic cancer. EXAM: CT LUMBAR SPINE WITHOUT CONTRAST TECHNIQUE: Multidetector CT imaging of the lumbar spine was performed without intravenous contrast administration. Multiplanar CT  image reconstructions were also generated. COMPARISON:  03/08/2020 CT abdomen FINDINGS: Segmentation: The lowest lumbar type non-rib-bearing vertebra is labeled  as L5. Alignment: No vertebral subluxation is observed. Vertebrae: Scattered new sclerotic metastatic lesions in the lumbar spine and bony pelvis. These include a 2.4 by 2.3 by 2.8 cm sclerotic lesion anteriorly in the L1 vertebral body; a 4.0 by 3.3 by 3.4 cm sclerotic lesion eccentric to the right in the L5 vertebral body, and multiple sclerotic lesions in the visualized iliac bones and sacrum, as well as some other smaller vertebral lesions. No current fracture.  Bridging spurring anteriorly at T11-T12-L1. Paraspinal and other soft tissues: Indistinct pancreatic tail compatible with mass, abutting the spleen. Aortoiliac atherosclerotic vascular disease. Disc levels: Facet and uncinate spurring contribute to osseous foraminal narrowing bilaterally at L4-5 and L5-S1, and potentially on the left at L2-3 and L3-4 as well. IMPRESSION: 1. New sclerotic metastatic lesions in the lumbar spine and bony pelvis, without fracture or vertebral subluxation. 2. Indistinct pancreatic tail compatible with mass, abutting the spleen. 3. Lumbar spondylosis and degenerative disc disease causing osseous foraminal narrowing bilaterally at L4-5 and L5-S1, and potentially on the left at L2-3 and L3-4 as well. 4. Aortic atherosclerosis. Aortic Atherosclerosis (ICD10-I70.0). Electronically Signed   By: Van Clines M.D.   On: 06/02/2020 19:47   MR THORACIC SPINE WO CONTRAST  Result Date: 06/02/2020 CLINICAL DATA:  Mid back pain with bilateral leg weakness. Metastatic pancreatic cancer. EXAM: MRI THORACIC SPINE WITHOUT CONTRAST (LIMITED) TECHNIQUE: Coronal and sagittal localizing images the spine were obtained. The patient was not able to complete the examination. No axial imaging was performed. No intravenous contrast was administered. COMPARISON:  Chest radiographs  05/28/2020 and chest CTA 04/20/2020. FINDINGS: Alignment:  Normal. Vertebrae: Widespread osseous metastatic disease. In the thoracic spine, there are lesions within the T1, T2, T3, T4, T5 and T6 vertebral bodies. There is involvement of the posterior elements at T1, T2 and T3. No significant pathologic fracture identified, although a small amount of epidural tumor difficult to exclude at T3. Metastases are also present within the L1 and L5 vertebral bodies. Cord:  No gross cord compression on localizing images. IMPRESSION: 1. Extremely limited study terminated after the scout images were obtained. 2. Widespread osseous metastatic disease throughout the thoracic spine as described. No significant pathologic fracture identified, although a small amount of epidural tumor difficult to exclude at T3. 3. No gross cord compression on localizing images. 4. Consider further evaluation with CT or repeat MR after appropriate sedation. Electronically Signed   By: Richardean Sale M.D.   On: 06/02/2020 17:57   DG FEMUR, MIN 2 VIEWS RIGHT  Result Date: 06/02/2020 CLINICAL DATA:  Clot in right leg EXAM: RIGHT FEMUR 2 VIEWS COMPARISON:  None. FINDINGS: There is no evidence of fracture or other focal bone lesions. Soft tissues are unremarkable. IMPRESSION: Negative. Electronically Signed   By: Donavan Foil M.D.   On: 06/02/2020 19:34      Assessment/Plan  Bilateral lower extremity weakness secondary to new metastatic disease to thoracic and lumbar spine CT could not exclude epidural tumor but patient unable to get MRI of the spine due to anxiety will give prophylactic dose of IV Decadron and patient will need to be arranged to have MRI under sedation tomorrow Will need oncology consult PT evaluation  Hypokalemia Repleted.  Will recheck in the morning.  Elevated lactate Unclear etiology.  Could be due to tumor burden.  No signs of infection.  We will continue to trend.  History of PE/recent DVT Wife was doing  both Xarelto and Lovenox.  Will only continue Lovenox for now  since he appeared to have failed Xarelto and developed DVT while on it.  History of metastatic pancreatic cancer Continue Creon, Marinol  Type 2 diabetes Moderate SSI  DVT prophylaxis:.Lovenox Code Status: Full Family Communication: Plan discussed with patient and wife at bedside  disposition Plan: Home with at least 2 midnight stays  Consults called:  Admission status: inpatient   Status is: Inpatient  Remains inpatient appropriate because:IV treatments appropriate due to intensity of illness or inability to take PO   Dispo: The patient is from: Home              Anticipated d/c is to: Home              Anticipated d/c date is: 3 days              Patient currently is not medically stable to d/c.         Orene Desanctis DO [facility]   If 7PM-7AM, please contact night-coverage [email or web address]   06/03/2020, 12:18 AM

## 2020-06-03 NOTE — Progress Notes (Signed)
  Echocardiogram 2D Echocardiogram has been performed.  Melvin [name] 06/03/2020, 2:08 PM

## 2020-06-03 NOTE — Consult Note (Addendum)
[place]  Telephone:([phone]) [phone] Fax:([phone]) [place]  Reason for Consultation: Metastatic pancreatic cancer, PE, DVT  HPI: Mr. Scheaffer is a 59 year old male with a past medical history significant for metastatic pancreatic cancer, recent diagnosis of PE and DVT, type 2 diabetes, and hyperlipidemia.  He presented to the emergency room with recurrent falls and bilateral lower extremity weakness.  He had a fever about 2 days prior to admission at home up to 102.  He was started on Xarelto for a saddle pulmonary embolism noted on 04/20/2020 and he was also diagnosed with extensive right lower extremity DVT on 05/28/2020 and was started on Lovenox.  It appears that his wife is getting both Lovenox and Xarelto.  He was not having any bleeding.  On admission his labs were significant for WBC of 3.9, hemoglobin 10.9, potassium 2.9, glucose 256, creatinine 0.47, calcium 7.8, albumin 2.5, AST 146, ALT 174, and alkaline phosphatase 522.  MRI of the thoracic spine showed widespread osseous metastatic disease throughout the thoracic spine without significant pathologic fracture, although a small amount of epidural tumor difficult to exclude a T3.  CT of the thoracic and lumbar spine without contrast showed new sclerotic metastatic lesions in the lumbar spine and bony pelvis without fracture or vertebral subluxation, indistinct pancreatic tail compatible with mass abutting the spleen, lumbar spondylosis and degenerative disc disease causing osseous foraminal narrowing bilaterally at L4-5 and L5-S1 and potentially at the left of L2-3 and L3-4.  The patient has been receiving chemotherapy with Gemzar and Abraxane.  He received day 1 of cycle #2 on 05/28/2020.  He is due for day 8 of cycle 2 on 06/04/2020.  The patient's wife had stepped outside the room at the time my visit.  The patient is aware that he is in the hospital.  However, he thinks that his medical oncologist has  been outside in the hallway all day but has not yet been in the room to see him.  Reports generalized weakness.  He is not currently complaining of any pain.  Denies nausea and vomiting.  States that he just wants to sleep.  Tearful at times.  Remainder review of systems is noncontributory.  Medical oncology was asked see the patient to make recommendations regarding his metastatic pancreatic cancer, PE, and DVT.   Past Medical History:  Diagnosis Date  . Constipation   . Diabetes mellitus without complication ([place])   . DVT femoral (deep venous thrombosis) with thrombophlebitis, right ([place]) 05/28/2020  . Dyspnea   . High cholesterol   . Hypertension   . Multiple pulmonary emboli ([place]) 04/20/2020  . Pancreatic cancer metastasized to liver ([place]) 03/12/2020  . Pancreatic cancer metastasized to lung [facility]) 03/12/2020  :  Past Surgical History:  Procedure Laterality Date  . IR IMAGING GUIDED PORT INSERTION  03/19/2020  . NO PAST SURGERIES    :  Current Facility-Administered Medications  Medication Dose Route Frequency Provider Last Rate Last Admin  . Chlorhexidine Gluconate Cloth 2 % PADS 6 each  6 each Topical Daily Tu, Ching T, DO   6 each at 06/03/20 0814  . dexamethasone (DECADRON) injection 4 mg  4 mg Intravenous Q6H Elodia Florence., MD      . diltiazem (CARDIZEM) 125 mg in dextrose 5% 125 mL (1 mg/mL) infusion  5-15 mg/hr Intravenous Titrated Mansy, Arvella Merles, MD   Held at 06/03/20 [phone]  . dronabinol (MARINOL) capsule 5 mg  5 mg Oral BID AC  Tu, Ching T, DO      . enoxaparin (LOVENOX) injection 105 mg  105 mg Subcutaneous BID Tu, Ching T, DO   105 mg at 06/03/20 0813  . [START ON 06/05/2020] fentaNYL (DURAGESIC) 12 MCG/HR 1 patch  1 patch Transdermal Q72H Tu, Ching T, DO      . gabapentin (NEURONTIN) capsule 300 mg  300 mg Oral TID Tu, Ching T, DO   300 mg at 06/03/20 0814  . HYDROcodone-acetaminophen (NORCO/VICODIN) 5-325 MG per tablet 1-2 tablet  1-2 tablet Oral Q6H PRN Tu, Ching T, DO       . HYDROcodone-homatropine (HYCODAN) 5-1.5 MG/5ML syrup 5 mL  5 mL Oral Q6H PRN Tu, Ching T, DO      . insulin aspart (novoLOG) injection 0-15 Units  0-15 Units Subcutaneous TID WC Tu, Ching T, DO   11 Units at 06/03/20 0756  . insulin aspart (novoLOG) injection 0-5 Units  0-5 Units Subcutaneous QHS Tu, Ching T, DO   3 Units at 06/03/20 0159  . insulin aspart (novoLOG) injection 3 Units  3 Units Subcutaneous TID WC Elodia Florence., MD      . insulin glargine (LANTUS) injection 12 Units  12 Units Subcutaneous QHS Elodia Florence., MD      . lipase/protease/amylase (CREON) capsule 24,000 Units  24,000 Units Oral TID Burke Keels, Ching T, DO   24,000 Units at 06/03/20 0756  . metoCLOPramide (REGLAN) tablet 5 mg  5 mg Oral PRN Tu, Ching T, DO      . morphine 2 MG/ML injection 1 mg  1 mg Intravenous Q3H PRN Tu, Ching T, DO      . sodium chloride flush (NS) 0.9 % injection 10-40 mL  10-40 mL Intracatheter Q12H Tu, Ching T, DO      . sodium chloride flush (NS) 0.9 % injection 10-40 mL  10-40 mL Intracatheter PRN Tu, Ching T, DO       Facility-Administered Medications Ordered in Other Encounters  Medication Dose Route Frequency Provider Last Rate Last Admin  . LORazepam (ATIVAN) tablet 0.5 mg  0.5 mg Oral Once [place], Sarah M, NP      . sodium chloride flush (NS) 0.9 % injection 10 mL  10 mL Intravenous PRN [place], Sarah M, NP   10 mL at 04/20/20 1104     No Known Allergies:  Family History  Adopted: Yes  Family history unknown: Yes  :  Social History   Socioeconomic History  . Marital status: Married    Spouse name: Not on file  . Number of children: Not on file  . Years of education: Not on file  . Highest education level: Not on file  Occupational History  . Not on file  Tobacco Use  . Smoking status: Former Smoker    Types: Cigarettes    Quit date: 03/11/1986    Years since quitting: 34.2  . Smokeless tobacco: Never Used  . Tobacco comment: Quit 1987  Vaping Use   . Vaping Use: Never used  Substance and Sexual Activity  . Alcohol use: Not on file    Comment: occ  . Drug use: Not Currently  . Sexual activity: Not on file  Other Topics Concern  . Not on file  Social History Narrative  . Not on file   Social Determinants of Health   Financial Resource Strain:   . Difficulty of Paying Living Expenses:   Food Insecurity:   . Worried About Charity fundraiser  in the Last Year:   . [place] in the Last Year:   Transportation Needs:   . Film/video editor (Medical):   Marland Kitchen Lack of Transportation (Non-Medical):   Physical Activity:   . Days of Exercise per Week:   . Minutes of Exercise per Session:   Stress:   . Feeling of Stress :   Social Connections:   . Frequency of Communication with Friends and Family:   . Frequency of Social Gatherings with Friends and Family:   . Attends Religious Services:   . Active Member of Clubs or Organizations:   . Attends Archivist Meetings:   Marland Kitchen Marital Status:   Intimate Partner Violence:   . Fear of Current or Ex-Partner:   . Emotionally Abused:   Marland Kitchen Physically Abused:   . Sexually Abused:   :  Review of Systems: A comprehensive 14 point review of systems was negative except as noted in the HPI.  Exam: Patient Vitals for the past 24 hrs:  BP Temp Temp src Pulse Resp SpO2 Height Weight  06/03/20 0542 (!) 154/101 (!) 97.4 F (36.3 C) Oral 92 18 94 % -- --  06/03/20 0135 (!) 170/119 97.7 F (36.5 C) Oral 89 -- 96 % '6\' 3"'$  (1.905 m) 107.3 kg  06/03/20 0100 (!) 141/97 -- -- 81 18 99 % -- --  06/02/20 2257 -- -- -- 89 -- 94 % -- --  06/02/20 2256 (!) 153/99 -- -- 89 16 94 % -- --  06/02/20 2100 137/90 -- -- (!) 133 14 97 % -- --  06/02/20 2029 (!) 137/94 -- -- 87 13 98 % -- --  06/02/20 1952 -- -- -- -- -- 96 % -- --  06/02/20 1947 (!) 140/109 -- -- (!) 47 15 90 % -- --  06/02/20 1823 (!) 126/99 -- -- 89 17 98 % -- --  06/02/20 1700 (!) 135/104 -- -- 98 -- 98 % -- --  06/02/20  1546 -- 97.6 F (36.4 C) Oral -- -- -- -- --  06/02/20 1544 -- -- -- -- -- -- 6' 3.5" (1.918 m) 104.8 kg  06/02/20 1544 (!) 138/104 -- -- 84 19 95 % -- --  06/02/20 1540 (!) 140/111 -- -- (!) 49 14 96 % -- --    General: Chronically ill-appearing male, no acute distress Eyes:  no scleral icterus.   ENT:  There were no oropharyngeal lesions.    Lymphatics:  Negative cervical, supraclavicular or axillary adenopathy.   Respiratory: lungs were clear bilaterally without wheezing or crackles.   Cardiovascular:  Regular rate and rhythm, S1/S2, without murmur, rub or gallop.  There was no pedal edema.   GI: Positive bowel sounds, soft, nontender Musculoskeletal: Strength symmetrical Skin exam was without echymosis, petichae.   Neuro exam was nonfocal.  The patient is alert and intermittently disoriented.  Tearful at times.  Lab Results  Component Value Date   WBC 2.1 (L) 06/03/2020   HGB 10.6 (L) 06/03/2020   HCT 33.5 (L) 06/03/2020   PLT 139 (L) 06/03/2020   GLUCOSE 302 (H) 06/03/2020   CHOL 132 08/04/2019   TRIG 118 08/04/2019   HDL 33 (L) 08/04/2019   LDLCALC 79 08/04/2019   ALT 174 (H) 06/02/2020   AST 146 (H) 06/02/2020   NA 139 06/03/2020   K 3.8 06/03/2020   CL 100 06/03/2020   CREATININE 0.48 (L) 06/03/2020   BUN 10 06/03/2020   CO2 29 06/03/2020  DG Chest 2 View  Result Date: 05/28/2020 CLINICAL DATA:  59 year old male with history of metastatic pancreatic cancer. Recent history of fall. EXAM: CHEST - 2 VIEW COMPARISON:  Chest x-ray 05/07/2020. FINDINGS: Right internal jugular single-lumen power porta cath with tip terminating in the distal superior vena cava. Diffuse reticulonodular pattern throughout the lungs bilaterally, very similar to the prior study. No confluent consolidative airspace disease. No pleural effusions. No evidence of pulmonary edema. No pneumothorax. Heart size is normal. Upper mediastinal contours are within normal limits. IMPRESSION: 1. Widespread  metastatic disease to the lungs appear similar to the prior examination, without definite radiographic evidence of acute cardiopulmonary disease. Electronically Signed   By: Vinnie Langton M.D.   On: 05/28/2020 12:07   DG Chest 2 View  Result Date: 05/07/2020 CLINICAL DATA:  Metastatic pancreas cancer EXAM: CHEST - 2 VIEW COMPARISON:  04/20/2020 FINDINGS: Right chest wall port a catheter tip is in the SVC. Normal heart size. No pleural effusion or edema. Diffuse pulmonary nodularity is identified throughout both lungs compatible with widespread metastatic disease. No superimposed airspace consolidation. Sclerotic bone metastases are again identified within the thoracic spine. IMPRESSION: 1. No acute cardiopulmonary abnormalities. 2. Widespread pulmonary metastatic disease. Electronically Signed   By: Kerby Moors M.D.   On: 05/07/2020 10:51   CT Thoracic Spine Wo Contrast  Result Date: 06/02/2020 CLINICAL DATA:  Metastatic pancreatic adenocarcinoma. Back pain and bilateral leg weakness. EXAM: CT THORACIC SPINE WITHOUT CONTRAST TECHNIQUE: Multidetector CT images of the thoracic were obtained using the standard protocol without intravenous contrast. COMPARISON:  MRI thoracic spine from 06/02/2020 FINDINGS: Alignment: No vertebral subluxation is observed. Vertebrae: Widespread osseous metastatic disease in the thoracic spine. Index left eccentric lesion at T3 measures 2.5 by 1.8 by 2.2 cm. Occasional rib lesions noted. Posterior element involvement at multiple levels notably at T1, T2, T3, and T4. Paraspinal and other soft tissues: Miliary nodules throughout the lungs probably from widespread metastatic disease to the lungs. If the patient is immunocompromised than miliary infection might also be considered. Small to moderate left and small right pleural effusions. Right Port-A-Cath tip: Cavoatrial junction. Aortic and coronary atherosclerosis. Scattered hypodense lesions in the liver compatible with  metastatic disease. Pancreatic tail mass. Suspected perihepatic ascites. CT is not considered sensitive for epidural tumor. Disc levels: No significant osseous foraminal narrowing is identified in the thoracic spine. IMPRESSION: 1. Widespread sclerotic osseous metastatic disease in the thoracic spine. Please note that today's exam is not considered sensitive in ruling out epidural tumor. 2. Miliary nodules throughout the lungs probably from widespread metastatic disease to the lungs. If the patient is immunocompromised than miliary infection might also be considered. 3. Hepatic metastatic disease. 4. Pancreatic tail mass. 5. Small to moderate left and small right pleural effusions. 6. Suspected perihepatic ascites. 7. Aortic atherosclerosis. Aortic Atherosclerosis (ICD10-I70.0). Electronically Signed   By: Van Clines M.D.   On: 06/02/2020 19:53   CT Lumbar Spine Wo Contrast  Result Date: 06/02/2020 CLINICAL DATA:  Back pain. Bilateral leg weakness, right greater than left. Metastatic pancreatic cancer. EXAM: CT LUMBAR SPINE WITHOUT CONTRAST TECHNIQUE: Multidetector CT imaging of the lumbar spine was performed without intravenous contrast administration. Multiplanar CT image reconstructions were also generated. COMPARISON:  03/08/2020 CT abdomen FINDINGS: Segmentation: The lowest lumbar type non-rib-bearing vertebra is labeled as L5. Alignment: No vertebral subluxation is observed. Vertebrae: Scattered new sclerotic metastatic lesions in the lumbar spine and bony pelvis. These include a 2.4 by 2.3 by 2.8 cm sclerotic lesion anteriorly  in the L1 vertebral body; a 4.0 by 3.3 by 3.4 cm sclerotic lesion eccentric to the right in the L5 vertebral body, and multiple sclerotic lesions in the visualized iliac bones and sacrum, as well as some other smaller vertebral lesions. No current fracture.  Bridging spurring anteriorly at T11-T12-L1. Paraspinal and other soft tissues: Indistinct pancreatic tail compatible  with mass, abutting the spleen. Aortoiliac atherosclerotic vascular disease. Disc levels: Facet and uncinate spurring contribute to osseous foraminal narrowing bilaterally at L4-5 and L5-S1, and potentially on the left at L2-3 and L3-4 as well. IMPRESSION: 1. New sclerotic metastatic lesions in the lumbar spine and bony pelvis, without fracture or vertebral subluxation. 2. Indistinct pancreatic tail compatible with mass, abutting the spleen. 3. Lumbar spondylosis and degenerative disc disease causing osseous foraminal narrowing bilaterally at L4-5 and L5-S1, and potentially on the left at L2-3 and L3-4 as well. 4. Aortic atherosclerosis. Aortic Atherosclerosis (ICD10-I70.0). Electronically Signed   By: Van Clines M.D.   On: 06/02/2020 19:47   MR THORACIC SPINE WO CONTRAST  Result Date: 06/02/2020 CLINICAL DATA:  Mid back pain with bilateral leg weakness. Metastatic pancreatic cancer. EXAM: MRI THORACIC SPINE WITHOUT CONTRAST (LIMITED) TECHNIQUE: Coronal and sagittal localizing images the spine were obtained. The patient was not able to complete the examination. No axial imaging was performed. No intravenous contrast was administered. COMPARISON:  Chest radiographs 05/28/2020 and chest CTA 04/20/2020. FINDINGS: Alignment:  Normal. Vertebrae: Widespread osseous metastatic disease. In the thoracic spine, there are lesions within the T1, T2, T3, T4, T5 and T6 vertebral bodies. There is involvement of the posterior elements at T1, T2 and T3. No significant pathologic fracture identified, although a small amount of epidural tumor difficult to exclude at T3. Metastases are also present within the L1 and L5 vertebral bodies. Cord:  No gross cord compression on localizing images. IMPRESSION: 1. Extremely limited study terminated after the scout images were obtained. 2. Widespread osseous metastatic disease throughout the thoracic spine as described. No significant pathologic fracture identified, although a small  amount of epidural tumor difficult to exclude at T3. 3. No gross cord compression on localizing images. 4. Consider further evaluation with CT or repeat MR after appropriate sedation. Electronically Signed   By: Richardean Sale M.D.   On: 06/02/2020 17:57   US Venous Img Lower Unilateral Right (DVT)  Result Date: 05/28/2020 CLINICAL DATA:  Right lower extremity pain and edema. History of malignancy. Evaluate for DVT. EXAM: RIGHT LOWER EXTREMITY VENOUS DOPPLER ULTRASOUND TECHNIQUE: Gray-scale sonography with graded compression, as well as color Doppler and duplex ultrasound were performed to evaluate the lower extremity deep venous systems from the level of the common femoral vein and including the common femoral, femoral, profunda femoral, popliteal and calf veins including the posterior tibial, peroneal and gastrocnemius veins when visible. The superficial great saphenous vein was also interrogated. Spectral Doppler was utilized to evaluate flow at rest and with distal augmentation maneuvers in the common femoral, femoral and popliteal veins. COMPARISON:  None. FINDINGS: Contralateral Common Femoral Vein: Respiratory phasicity is normal and symmetric with the symptomatic side. No evidence of thrombus. Normal compressibility. Common Femoral Vein: No evidence of thrombus. Normal compressibility, respiratory phasicity and response to augmentation. Saphenofemoral Junction: No evidence of thrombus. Normal compressibility and flow on color Doppler imaging. Profunda Femoral Vein: No evidence of thrombus. Normal compressibility and flow on color Doppler imaging. Femoral Vein: There is hypoechoic occlusive thrombus involving the proximal (image 80 and 9), mid (images 12 and 13) and distal (  images 14 and 15) aspects of right femoral vein. Popliteal Vein: There is hypoechoic occlusive thrombus involving the right popliteal vein (images 19 and 21). Calf Veins: There is hypoechoic occlusive thrombus involving right  posterior tibial (image 28) and peroneal (image 30) veins. Superficial Great Saphenous Vein: No evidence of thrombus. Normal compressibility. Venous Reflux:  None. Other Findings: There is hypoechoic occlusive thrombus involving the right gastrocnemius vein (images 22 through 25). IMPRESSION: 1. The examination is positive for occlusive DVT extending from the proximal aspect of the right femoral vein through the imaged right tibial veins. 2. Examination is also positive for occlusive superficial thrombophlebitis involving the right gastrocnemius vein. Electronically Signed   By: Sandi Mariscal M.D.   On: 05/28/2020 12:32   DG FEMUR, MIN 2 VIEWS RIGHT  Result Date: 06/02/2020 CLINICAL DATA:  Clot in right leg EXAM: RIGHT FEMUR 2 VIEWS COMPARISON:  None. FINDINGS: There is no evidence of fracture or other focal bone lesions. Soft tissues are unremarkable. IMPRESSION: Negative. Electronically Signed   By: Donavan Foil M.D.   On: 06/02/2020 19:34     DG Chest 2 View  Result Date: 05/28/2020 CLINICAL DATA:  60 year old male with history of metastatic pancreatic cancer. Recent history of fall. EXAM: CHEST - 2 VIEW COMPARISON:  Chest x-ray 05/07/2020. FINDINGS: Right internal jugular single-lumen power porta cath with tip terminating in the distal superior vena cava. Diffuse reticulonodular pattern throughout the lungs bilaterally, very similar to the prior study. No confluent consolidative airspace disease. No pleural effusions. No evidence of pulmonary edema. No pneumothorax. Heart size is normal. Upper mediastinal contours are within normal limits. IMPRESSION: 1. Widespread metastatic disease to the lungs appear similar to the prior examination, without definite radiographic evidence of acute cardiopulmonary disease. Electronically Signed   By: Vinnie Langton M.D.   On: 05/28/2020 12:07   DG Chest 2 View  Result Date: 05/07/2020 CLINICAL DATA:  Metastatic pancreas cancer EXAM: CHEST - 2 VIEW COMPARISON:   04/20/2020 FINDINGS: Right chest wall port a catheter tip is in the SVC. Normal heart size. No pleural effusion or edema. Diffuse pulmonary nodularity is identified throughout both lungs compatible with widespread metastatic disease. No superimposed airspace consolidation. Sclerotic bone metastases are again identified within the thoracic spine. IMPRESSION: 1. No acute cardiopulmonary abnormalities. 2. Widespread pulmonary metastatic disease. Electronically Signed   By: Kerby Moors M.D.   On: 05/07/2020 10:51   CT Thoracic Spine Wo Contrast  Result Date: 06/02/2020 CLINICAL DATA:  Metastatic pancreatic adenocarcinoma. Back pain and bilateral leg weakness. EXAM: CT THORACIC SPINE WITHOUT CONTRAST TECHNIQUE: Multidetector CT images of the thoracic were obtained using the standard protocol without intravenous contrast. COMPARISON:  MRI thoracic spine from 06/02/2020 FINDINGS: Alignment: No vertebral subluxation is observed. Vertebrae: Widespread osseous metastatic disease in the thoracic spine. Index left eccentric lesion at T3 measures 2.5 by 1.8 by 2.2 cm. Occasional rib lesions noted. Posterior element involvement at multiple levels notably at T1, T2, T3, and T4. Paraspinal and other soft tissues: Miliary nodules throughout the lungs probably from widespread metastatic disease to the lungs. If the patient is immunocompromised than miliary infection might also be considered. Small to moderate left and small right pleural effusions. Right Port-A-Cath tip: Cavoatrial junction. Aortic and coronary atherosclerosis. Scattered hypodense lesions in the liver compatible with metastatic disease. Pancreatic tail mass. Suspected perihepatic ascites. CT is not considered sensitive for epidural tumor. Disc levels: No significant osseous foraminal narrowing is identified in the thoracic spine. IMPRESSION: 1. Widespread sclerotic osseous  metastatic disease in the thoracic spine. Please note that today's exam is not  considered sensitive in ruling out epidural tumor. 2. Miliary nodules throughout the lungs probably from widespread metastatic disease to the lungs. If the patient is immunocompromised than miliary infection might also be considered. 3. Hepatic metastatic disease. 4. Pancreatic tail mass. 5. Small to moderate left and small right pleural effusions. 6. Suspected perihepatic ascites. 7. Aortic atherosclerosis. Aortic Atherosclerosis (ICD10-I70.0). Electronically Signed   By: Gaylyn Rong M.D.   On: 06/02/2020 19:53   CT Lumbar Spine Wo Contrast  Result Date: 06/02/2020 CLINICAL DATA:  Back pain. Bilateral leg weakness, right greater than left. Metastatic pancreatic cancer. EXAM: CT LUMBAR SPINE WITHOUT CONTRAST TECHNIQUE: Multidetector CT imaging of the lumbar spine was performed without intravenous contrast administration. Multiplanar CT image reconstructions were also generated. COMPARISON:  03/08/2020 CT abdomen FINDINGS: Segmentation: The lowest lumbar type non-rib-bearing vertebra is labeled as L5. Alignment: No vertebral subluxation is observed. Vertebrae: Scattered new sclerotic metastatic lesions in the lumbar spine and bony pelvis. These include a 2.4 by 2.3 by 2.8 cm sclerotic lesion anteriorly in the L1 vertebral body; a 4.0 by 3.3 by 3.4 cm sclerotic lesion eccentric to the right in the L5 vertebral body, and multiple sclerotic lesions in the visualized iliac bones and sacrum, as well as some other smaller vertebral lesions. No current fracture.  Bridging spurring anteriorly at T11-T12-L1. Paraspinal and other soft tissues: Indistinct pancreatic tail compatible with mass, abutting the spleen. Aortoiliac atherosclerotic vascular disease. Disc levels: Facet and uncinate spurring contribute to osseous foraminal narrowing bilaterally at L4-5 and L5-S1, and potentially on the left at L2-3 and L3-4 as well. IMPRESSION: 1. New sclerotic metastatic lesions in the lumbar spine and bony pelvis, without  fracture or vertebral subluxation. 2. Indistinct pancreatic tail compatible with mass, abutting the spleen. 3. Lumbar spondylosis and degenerative disc disease causing osseous foraminal narrowing bilaterally at L4-5 and L5-S1, and potentially on the left at L2-3 and L3-4 as well. 4. Aortic atherosclerosis. Aortic Atherosclerosis (ICD10-I70.0). Electronically Signed   By: Gaylyn Rong M.D.   On: 06/02/2020 19:47   MR THORACIC SPINE WO CONTRAST  Result Date: 06/02/2020 CLINICAL DATA:  Mid back pain with bilateral leg weakness. Metastatic pancreatic cancer. EXAM: MRI THORACIC SPINE WITHOUT CONTRAST (LIMITED) TECHNIQUE: Coronal and sagittal localizing images the spine were obtained. The patient was not able to complete the examination. No axial imaging was performed. No intravenous contrast was administered. COMPARISON:  Chest radiographs 05/28/2020 and chest CTA 04/20/2020. FINDINGS: Alignment:  Normal. Vertebrae: Widespread osseous metastatic disease. In the thoracic spine, there are lesions within the T1, T2, T3, T4, T5 and T6 vertebral bodies. There is involvement of the posterior elements at T1, T2 and T3. No significant pathologic fracture identified, although a small amount of epidural tumor difficult to exclude at T3. Metastases are also present within the L1 and L5 vertebral bodies. Cord:  No gross cord compression on localizing images. IMPRESSION: 1. Extremely limited study terminated after the scout images were obtained. 2. Widespread osseous metastatic disease throughout the thoracic spine as described. No significant pathologic fracture identified, although a small amount of epidural tumor difficult to exclude at T3. 3. No gross cord compression on localizing images. 4. Consider further evaluation with CT or repeat MR after appropriate sedation. Electronically Signed   By: Carey Bullocks M.D.   On: 06/02/2020 17:57   US Venous Img Lower Unilateral Right (DVT)  Result Date: 05/28/2020 CLINICAL  DATA:  Right lower  extremity pain and edema. History of malignancy. Evaluate for DVT. EXAM: RIGHT LOWER EXTREMITY VENOUS DOPPLER ULTRASOUND TECHNIQUE: Gray-scale sonography with graded compression, as well as color Doppler and duplex ultrasound were performed to evaluate the lower extremity deep venous systems from the level of the common femoral vein and including the common femoral, femoral, profunda femoral, popliteal and calf veins including the posterior tibial, peroneal and gastrocnemius veins when visible. The superficial great saphenous vein was also interrogated. Spectral Doppler was utilized to evaluate flow at rest and with distal augmentation maneuvers in the common femoral, femoral and popliteal veins. COMPARISON:  None. FINDINGS: Contralateral Common Femoral Vein: Respiratory phasicity is normal and symmetric with the symptomatic side. No evidence of thrombus. Normal compressibility. Common Femoral Vein: No evidence of thrombus. Normal compressibility, respiratory phasicity and response to augmentation. Saphenofemoral Junction: No evidence of thrombus. Normal compressibility and flow on color Doppler imaging. Profunda Femoral Vein: No evidence of thrombus. Normal compressibility and flow on color Doppler imaging. Femoral Vein: There is hypoechoic occlusive thrombus involving the proximal (image 80 and 9), mid (images 12 and 13) and distal (images 14 and 15) aspects of right femoral vein. Popliteal Vein: There is hypoechoic occlusive thrombus involving the right popliteal vein (images 19 and 21). Calf Veins: There is hypoechoic occlusive thrombus involving right posterior tibial (image 28) and peroneal (image 30) veins. Superficial Great Saphenous Vein: No evidence of thrombus. Normal compressibility. Venous Reflux:  None. Other Findings: There is hypoechoic occlusive thrombus involving the right gastrocnemius vein (images 22 through 25). IMPRESSION: 1. The examination is positive for occlusive DVT  extending from the proximal aspect of the right femoral vein through the imaged right tibial veins. 2. Examination is also positive for occlusive superficial thrombophlebitis involving the right gastrocnemius vein. Electronically Signed   By: Sandi Mariscal M.D.   On: 05/28/2020 12:32   DG FEMUR, MIN 2 VIEWS RIGHT  Result Date: 06/02/2020 CLINICAL DATA:  Clot in right leg EXAM: RIGHT FEMUR 2 VIEWS COMPARISON:  None. FINDINGS: There is no evidence of fracture or other focal bone lesions. Soft tissues are unremarkable. IMPRESSION: Negative. Electronically Signed   By: Donavan Foil M.D.   On: 06/02/2020 19:34   Assessment and Plan:  1.  Metastatic pancreatic cancer 2.  PE and DVT 3.  Bilateral lower extremity weakness 4.  Hypokalemia 5.  Diabetes mellitus  -Imaging results have been reviewed which show widespread sclerotic lesions to the thoracic spine, new sclerotic lesions in the lumbar spine and pelvis, multiple pulmonary nodules, and metastatic disease to the liver.  He has been receiving systemic chemotherapy, but clearly it is not controlling his disease.  The patient would benefit from palliative care consult to discuss goals of care.  Further recommendations per Dr. Marin Olp regarding treatment options. -Continue Lovenox for treatment of PE and DVT -Recommend PT/OT consult for lower extremity weakness -Replete potassium per hospitalist -Management of diabetes per hospitalist  Mikey Bussing, DNP, AGPCNP-BC, AOCNP  ADDENDUM: I saw Mr. Atchley this morning.  I see him in the office yesterday.  Unfortunately, I just think that we in a situation where his cancer is just progressing.  He had scans done yesterday and MRI also.  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  On his initial scans that he had back in March, there is no evidence of any disease in his back.  Clearly, this is a very aggressive malignancy.  I really am suspect that chemotherapy will even help this.  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  This often makes malignancies resistant to chemotherapy.  I just think that we were organized to focus on his quality of life.  I know this will be incredibly difficult for the patient and his wife to agree with.  I know that they have always been aggressive with respect to try to be treated.  He initially presented with extensive pulmonary disease and had shortness of breath.  He subsequently has developed pulmonary emboli.  He also developed a blood clot in his right leg.  He is on Lovenox for this now.  I would be very suspect if he actually has cord compression.  I cannot recall in 25 years a pancreatic cancer causing cord compression.  We really cannot use a lot of steroids on him because of his diabetes which is under poor control.  We will follow along closely and tried to "move" the patient toward comfort and trying to get Hospice involved.  I know that the staff up on [address] will try their best to help him and give him incredible care.  Lattie Haw, MD

## 2020-06-03 NOTE — Progress Notes (Signed)
Inpatient Diabetes Program Recommendations  AACE/ADA: New Consensus Statement on Inpatient Glycemic Control (2015)  Target Ranges:  Prepandial:   less than 140 mg/dL      Peak postprandial:   less than 180 mg/dL (1-2 hours)      Critically ill patients:  140 - 180 mg/dL   Lab Results  Component Value Date   GLUCAP 341 (H) 06/03/2020   HGBA1C 8.9 (A) 05/06/2020    Review of Glycemic Control  Diabetes history: DM2 Outpatient Diabetes medications: Decadron 4 mg bid, Novolog 15 units tidwc, metformin 1000 mg QAM Current orders for Inpatient glycemic control: Novolog 0-15 units tidwc and hs  Inpatient Diabetes Program Recommendations:     Add Lantus 12 units QHS Add Novolog 3 units tidwc for meal coverage insulin  Follow glucose trends. Avoid hypoglycemia.  Thank you. Lorenda Peck, RD, LDN, CDE Inpatient Diabetes Coordinator [phone]

## 2020-06-04 ENCOUNTER — Encounter: Payer: Self-pay | Admitting: Pharmacist

## 2020-06-04 ENCOUNTER — Other Ambulatory Visit: Payer: Self-pay | Admitting: Hematology & Oncology

## 2020-06-04 ENCOUNTER — Inpatient Hospital Stay (HOSPITAL_COMMUNITY): Payer: PRIVATE HEALTH INSURANCE

## 2020-06-04 ENCOUNTER — Inpatient Hospital Stay: Payer: PRIVATE HEALTH INSURANCE

## 2020-06-04 DIAGNOSIS — C7800 Secondary malignant neoplasm of unspecified lung: Secondary | ICD-10-CM | POA: Diagnosis not present

## 2020-06-04 DIAGNOSIS — I471 Supraventricular tachycardia, unspecified: Secondary | ICD-10-CM

## 2020-06-04 DIAGNOSIS — C7951 Secondary malignant neoplasm of bone: Secondary | ICD-10-CM | POA: Diagnosis not present

## 2020-06-04 DIAGNOSIS — C787 Secondary malignant neoplasm of liver and intrahepatic bile duct: Secondary | ICD-10-CM | POA: Diagnosis not present

## 2020-06-04 DIAGNOSIS — C189 Malignant neoplasm of colon, unspecified: Secondary | ICD-10-CM | POA: Diagnosis not present

## 2020-06-04 DIAGNOSIS — R29898 Other symptoms and signs involving the musculoskeletal system: Secondary | ICD-10-CM | POA: Diagnosis not present

## 2020-06-04 LAB — COMPREHENSIVE METABOLIC PANEL
ALT: 123 U/L — ABNORMAL HIGH (ref 0–44)
AST: 82 U/L — ABNORMAL HIGH (ref 15–41)
Albumin: 2.3 g/dL — ABNORMAL LOW (ref 3.5–5.0)
Alkaline Phosphatase: 513 U/L — ABNORMAL HIGH (ref 38–126)
Anion gap: 7 (ref 5–15)
BUN: 10 mg/dL (ref 6–20)
CO2: 29 mmol/L (ref 22–32)
Calcium: 7.5 mg/dL — ABNORMAL LOW (ref 8.9–10.3)
Chloride: 99 mmol/L (ref 98–111)
Creatinine, Ser: 0.46 mg/dL — ABNORMAL LOW (ref 0.61–1.24)
GFR calc Af Amer: >60 mL/min (ref >60)
GFR calc non Af Amer: >60 mL/min (ref >60)
Glucose, Bld: 276 mg/dL — ABNORMAL HIGH (ref 70–99)
Potassium: 3.5 mmol/L (ref 3.5–5.1)
Sodium: 135 mmol/L (ref 135–145)
Total Bilirubin: 1 mg/dL (ref 0.3–1.2)
Total Protein: 5.1 g/dL — ABNORMAL LOW (ref 6.5–8.1)

## 2020-06-04 LAB — CBC WITH DIFFERENTIAL/PLATELET
Abs Immature Granulocytes: 0.2 10*3/uL — ABNORMAL HIGH (ref 0.00–0.07)
Basophils Absolute: 0 10*3/uL (ref 0.0–0.1)
Basophils Relative: 0 %
Eosinophils Absolute: 0 10*3/uL (ref 0.0–0.5)
Eosinophils Relative: 0 %
HCT: 33.9 % — ABNORMAL LOW (ref 39.0–52.0)
Hemoglobin: 10.8 g/dL — ABNORMAL LOW (ref 13.0–17.0)
Immature Granulocytes: 4 %
Lymphocytes Relative: 14 %
Lymphs Abs: 0.6 10*3/uL — ABNORMAL LOW (ref 0.7–4.0)
MCH: 30.2 pg (ref 26.0–34.0)
MCHC: 31.9 g/dL (ref 30.0–36.0)
MCV: 94.7 fL (ref 80.0–100.0)
Monocytes Absolute: 0.2 10*3/uL (ref 0.1–1.0)
Monocytes Relative: 4 %
Neutro Abs: 3.5 10*3/uL (ref 1.7–7.7)
Neutrophils Relative %: 78 %
Platelets: 133 10*3/uL — ABNORMAL LOW (ref 150–400)
RBC: 3.58 MIL/uL — ABNORMAL LOW (ref 4.22–5.81)
RDW: 17.4 % — ABNORMAL HIGH (ref 11.5–15.5)
WBC: 4.5 10*3/uL (ref 4.0–10.5)
nRBC: 0.4 % — ABNORMAL HIGH (ref 0.0–0.2)

## 2020-06-04 LAB — GLUCOSE, CAPILLARY
Glucose-Capillary: 104 mg/dL — ABNORMAL HIGH (ref 70–99)
Glucose-Capillary: 381 mg/dL — ABNORMAL HIGH (ref 70–99)
Glucose-Capillary: 335 mg/dL — ABNORMAL HIGH (ref 70–99)
Glucose-Capillary: 356 mg/dL — ABNORMAL HIGH (ref 70–99)

## 2020-06-04 LAB — MAGNESIUM: Magnesium: 1.5 mg/dL — ABNORMAL LOW (ref 1.7–2.4)

## 2020-06-04 LAB — PHOSPHORUS: Phosphorus: 3.5 mg/dL (ref 2.5–4.6)

## 2020-06-04 LAB — PREALBUMIN: Prealbumin: 15.3 mg/dL — ABNORMAL LOW (ref 18–38)

## 2020-06-04 IMAGING — MR MR LUMBAR SPINE W/O CM
4 of 7 series · 22 of 48 positions shown · non-contrast
Comparison: Thoracic and lumbar spine CT [DATE]. Thoracic spine
MRI [DATE].

CLINICAL DATA: Back pain and bilateral leg weakness. Metastatic
pancreatic cancer.

EXAM:
MRI THORACIC AND LUMBAR SPINE WITHOUT CONTRAST
TECHNIQUE: Multiplanar and multiecho pulse sequences of the thoracic and lumbar
spine were obtained without intravenous contrast.

[Series 9: T1 · sagittal · 4.0mm · 0.88mm/px · 3 of 17 slices shown (1 of 2)]
[im 1/17]
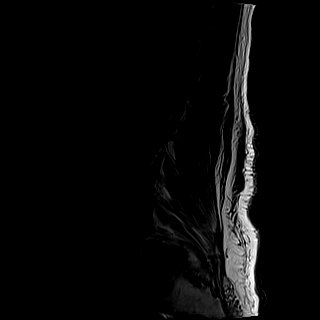
[im 9/17]
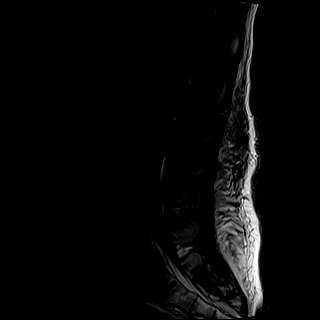
[im 17/17]
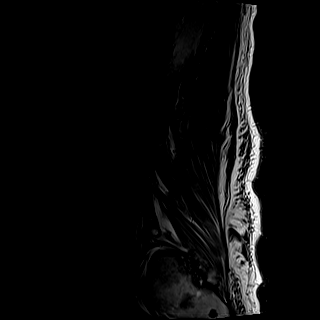

[Series 10: STIR · sagittal · 4.0mm · 0.51mm/px · 3 of 17 slices shown]
[im 1/17]
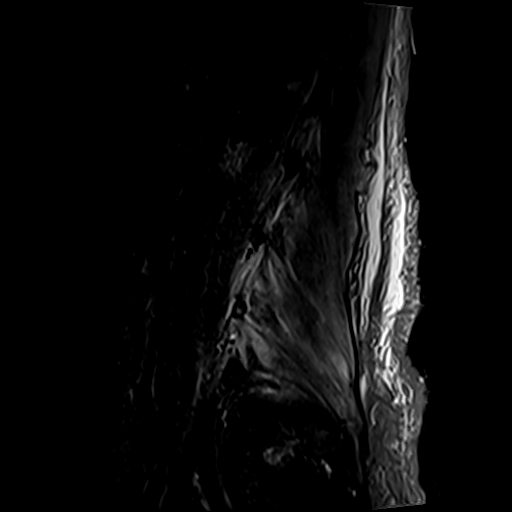
[im 9/17]
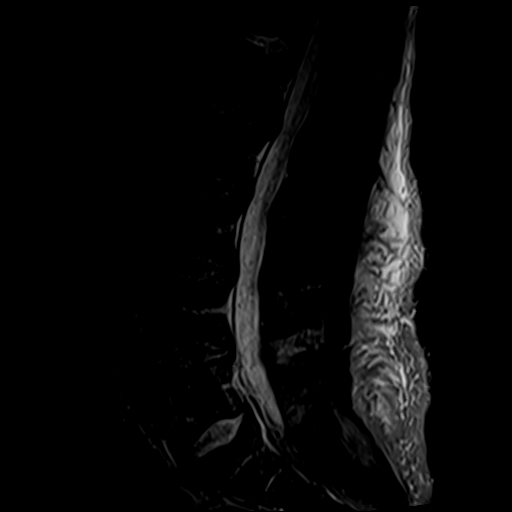
[im 17/17]
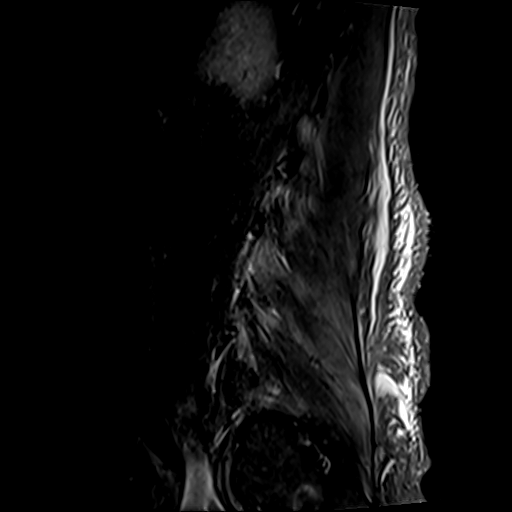

[Series 11: T2 · axial · 4.0mm · 0.62mm/px · z∈[-194,+43]mm · 8 of 49 slices shown]
[im 1/49]
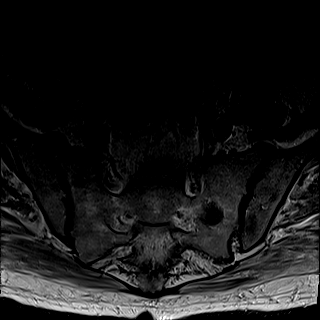
[im 7/49]
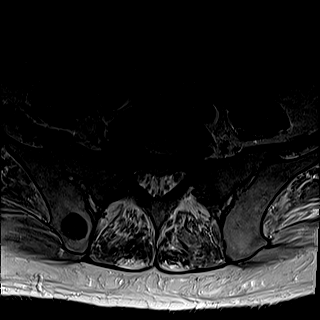
[im 14/49]
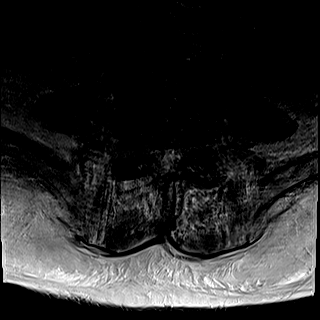
[im 21/49]
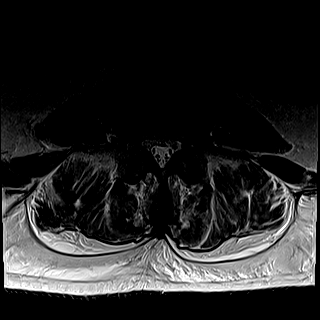
[im 28/49]
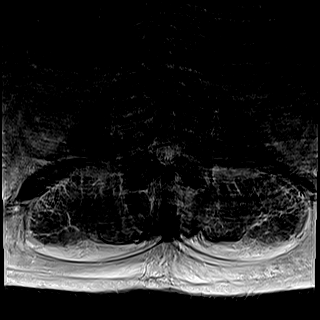
[im 35/49]
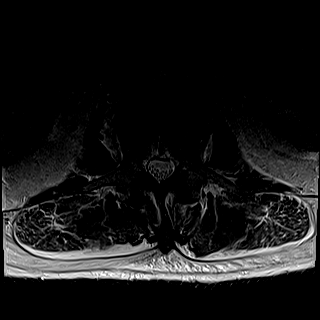
[im 42/49]
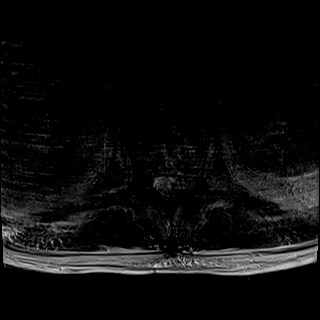
[im 49/49]
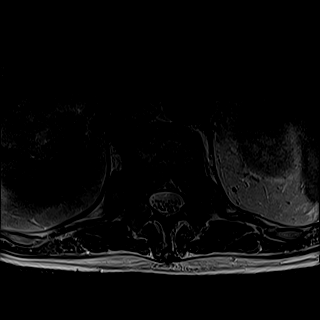

[Series 12: T1 · axial · 4.0mm · 0.43mm/px · z∈[-192,+46]mm · 8 of 49 slices shown (2 of 2)]
[im 1/49]
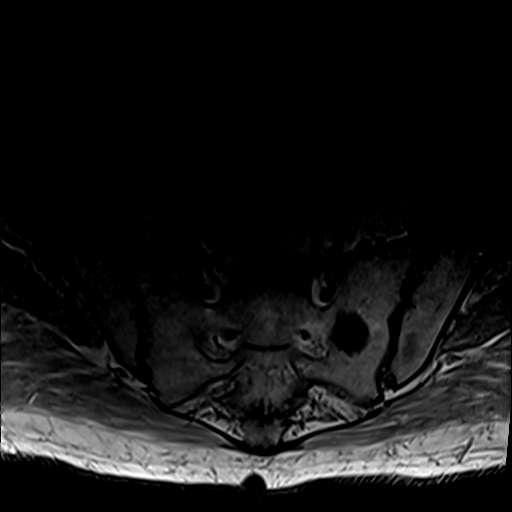
[im 7/49]
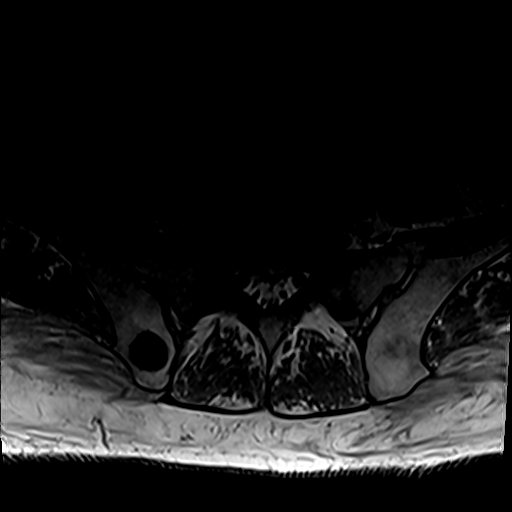
[im 14/49]
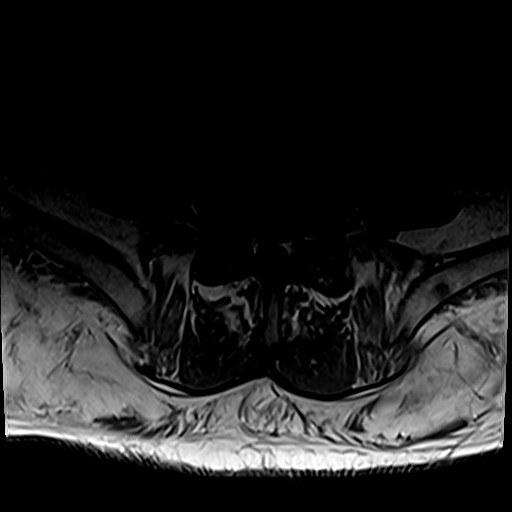
[im 21/49]
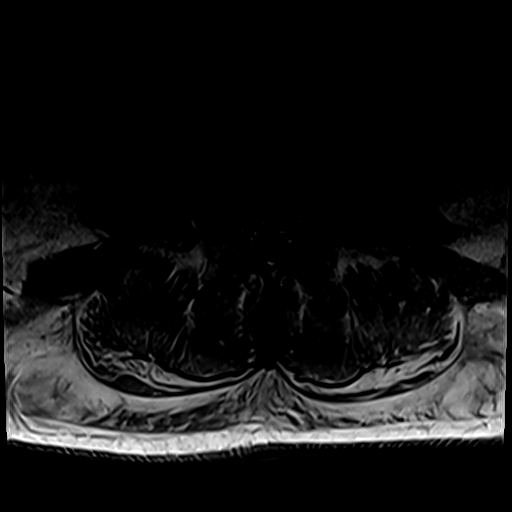
[im 28/49]
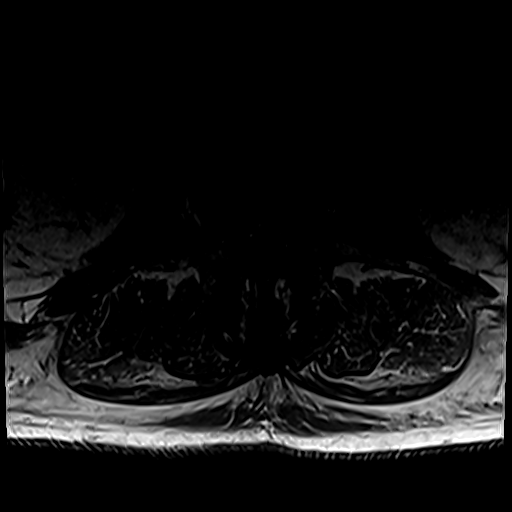
[im 35/49]
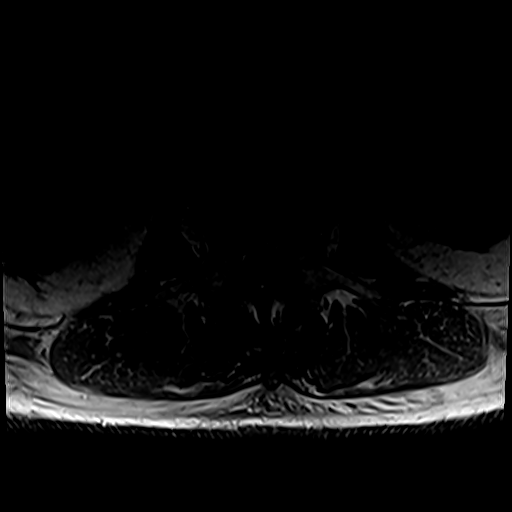
[im 42/49]
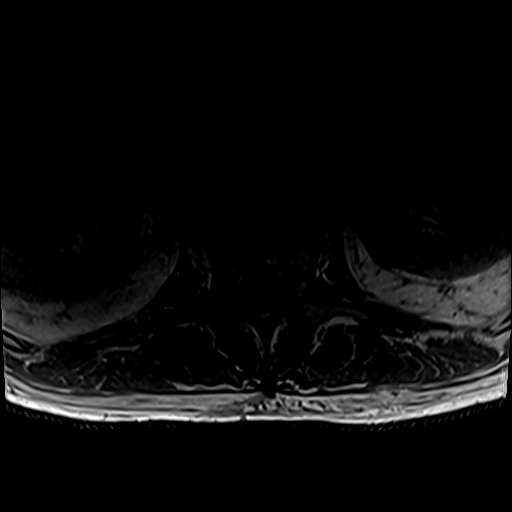
[im 49/49]
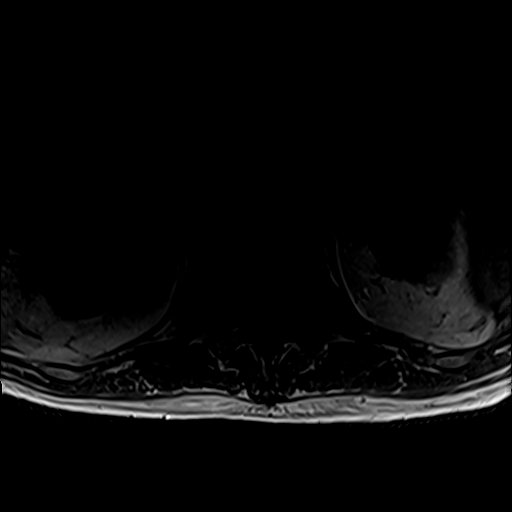

[22 of 48 positions shown; findings below may reference images not displayed]

FINDINGS: The patient could not tolerate the examination which was terminated
prematurely. IV contrast was not administered, and planned
postcontrast sequences (including sagittal T2 imaging) were not
obtained. The study is motion degraded throughout including severe
motion artifact on all axial thoracic spine sequences which are
largely nondiagnostic.

MRI THORACIC SPINE FINDINGS

Alignment:  No listhesis.

Vertebrae: As seen on the prior studies, there are multiple
sclerotic metastases with extensive involvement of the upper
thoracic spine (with the largest lesions involving the T3 and T4
vertebral bodies and T2-T4 posterior elements). No gross epidural
tumor is identified although assessment for small
volume/noncompressive epidural tumor is limited on this study.
Preserved vertebral body heights without a gross fracture
identified.

Cord:  No cord compression.

Paraspinal and other soft tissues: Small right and small to moderate
left pleural effusions.

Disc levels: Widespread bridging vertebral osteophytes in the
thoracic spine.

MRI LUMBAR SPINE FINDINGS

Segmentation:  Standard.

Alignment:  Normal.

Vertebrae: Large sclerotic lesions in the L1 and L5 vertebral
bodies. Additional sclerotic lesions partially visualized in the
sacrum and YOEL. No definite epidural tumor. Preserved vertebral
body heights without a fracture identified.

Conus medullaris and cauda equina: Conus ends at or above the T12
level and is poorly visualized. Cauda equina is grossly
unremarkable.

Paraspinal and other soft tissues: Grossly unremarkable.

Disc levels:

L1-2: Mild disc bulging and facet and ligamentum flavum hypertrophy
result in borderline to mild spinal stenosis without significant
neural foraminal stenosis.

L2-3: Mild disc bulging and mild facet and ligamentum flavum
hypertrophy without evidence of significant stenosis.

L3-4: Mild disc bulging and mild facet and ligamentum flavum
hypertrophy result in mild spinal stenosis, mild bilateral lateral
recess stenosis, and mild left neural foraminal stenosis.

L4-5: Minimal disc bulging and moderate facet hypertrophy result in
mild left neural foraminal stenosis without significant spinal
stenosis.

L5-S1: Asymmetric severe right facet arthrosis result in mild right
neural foraminal stenosis without spinal stenosis.
IMPRESSION: 1. Incomplete, motion degraded examination including largely
nondiagnostic axial thoracic spine imaging. No IV contrast
administered.
2. Widespread osseous metastases including extensive involvement of
the upper thoracic spine. No gross epidural tumor identified.
3. No thoracic spinal cord compression.
4. Lumbar spondylosis and facet arthrosis with mild spinal stenosis
at L3-4.

## 2020-06-04 IMAGING — MR MR THORACIC SPINE W/O CM
6 of 8 series · 28 of 48 positions shown · non-contrast
Comparison: Thoracic and lumbar spine CT [DATE]. Thoracic spine
MRI [DATE].

CLINICAL DATA: Back pain and bilateral leg weakness. Metastatic
pancreatic cancer.

EXAM:
MRI THORACIC AND LUMBAR SPINE WITHOUT CONTRAST
TECHNIQUE: Multiplanar and multiecho pulse sequences of the thoracic and lumbar
spine were obtained without intravenous contrast.

[Series 31: T1 · sagittal · 4.0mm · 1.95mm/px · 1 of 5 slices shown (1 of 3)]
[im 1/5]
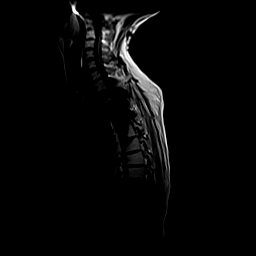

[Series 32: STIR · sagittal · 3.0mm · 1.16mm/px · 4 of 21 slices shown]
[im 1/21]
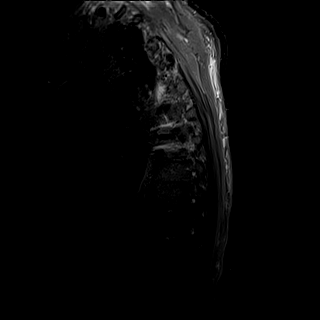
[im 7/21]
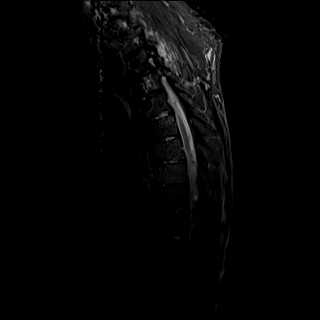
[im 14/21]
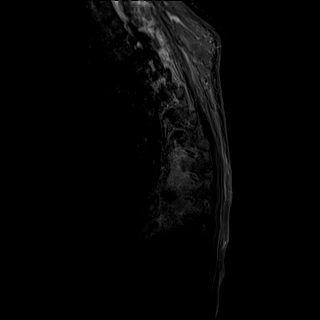
[im 21/21]
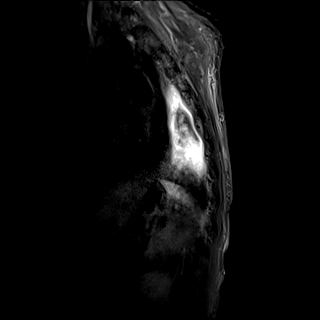

[Series 33: T1 · sagittal · 3.0mm · 1.16mm/px · 4 of 21 slices shown (2 of 3)]
[im 1/21]
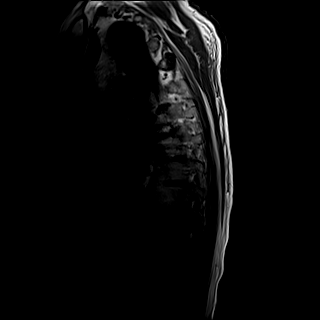
[im 7/21]
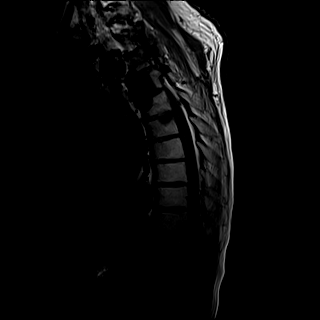
[im 14/21]
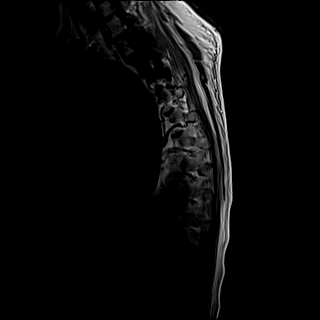
[im 21/21]
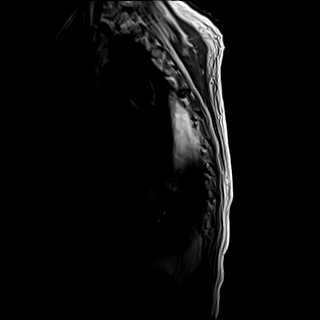

[Series 34: T2 · axial · 4.0mm · 0.78mm/px · z∈[-287,+2]mm · 7 of 39 slices shown]
[im 1/39]
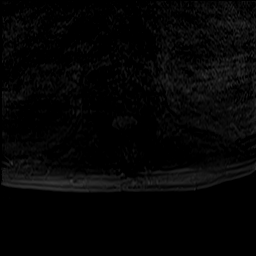
[im 7/39]
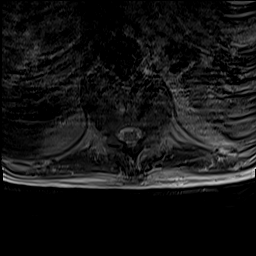
[im 13/39]
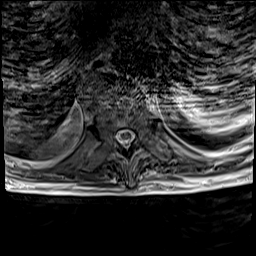
[im 20/39]
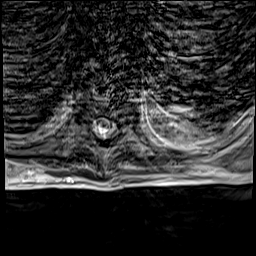
[im 26/39]
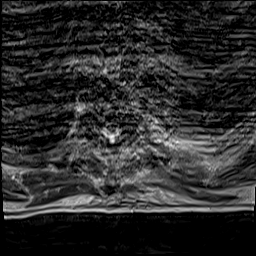
[im 32/39]
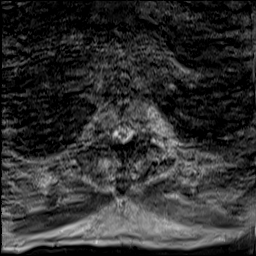
[im 39/39]
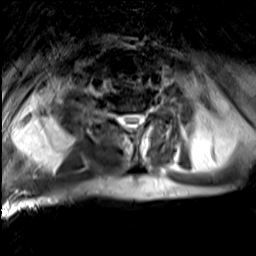

[Series 35: t2_me2d_tra · axial · 4.0mm · 0.39mm/px · z∈[-287,-84]mm · 5 of 39 slices shown]
[im 1/39]
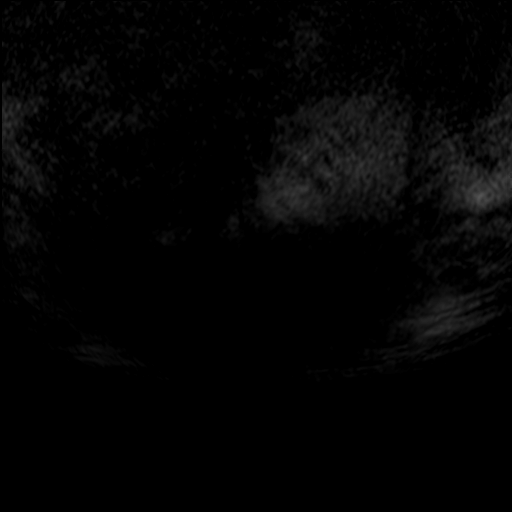
[im 7/39]
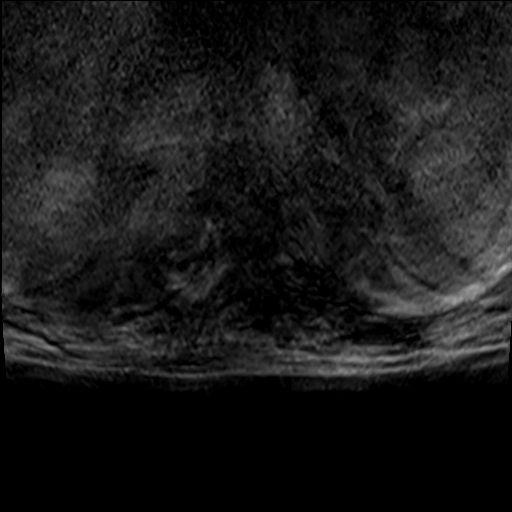
[im 13/39]
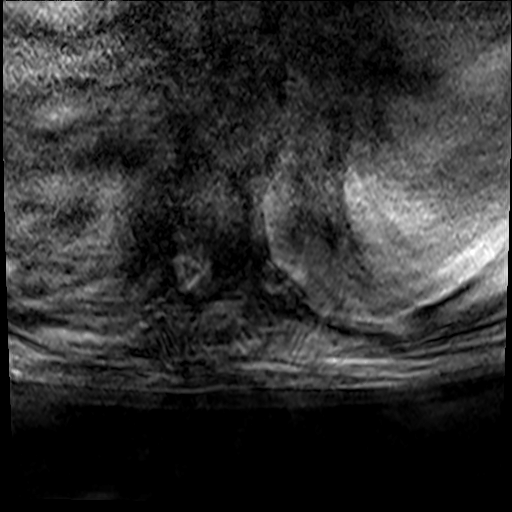
[im 20/39]
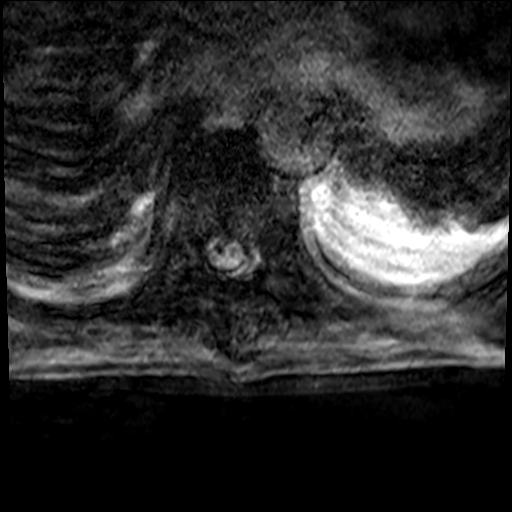
[im 26/39]
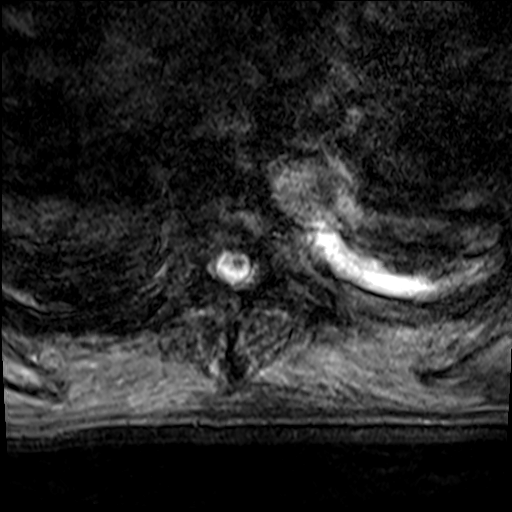

[Series 36: T1 · axial · 4.0mm · 0.39mm/px · z∈[-287,+2]mm · 7 of 39 slices shown (3 of 3)]
[im 1/39]
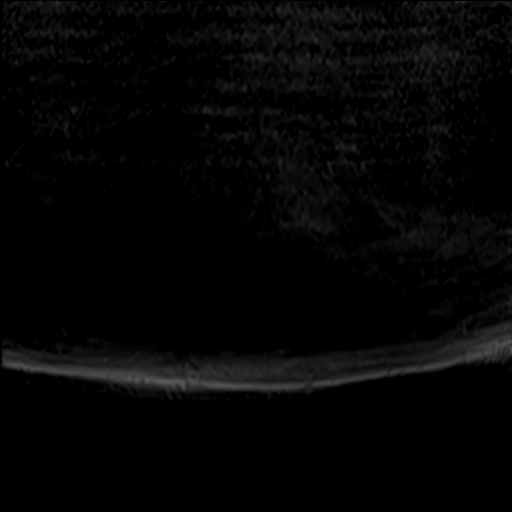
[im 7/39]
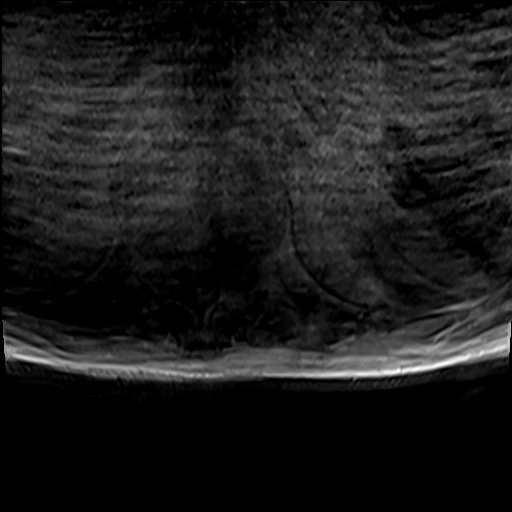
[im 13/39]
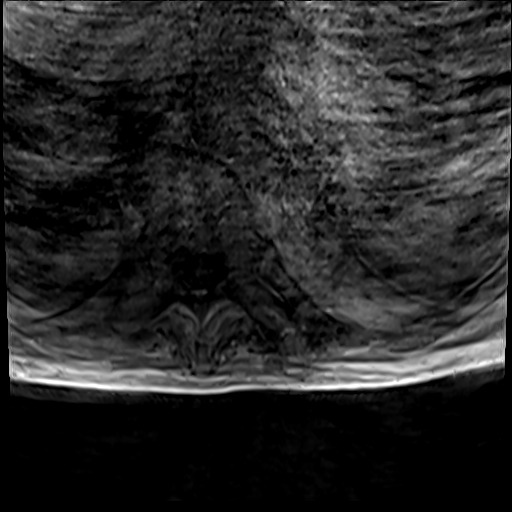
[im 20/39]
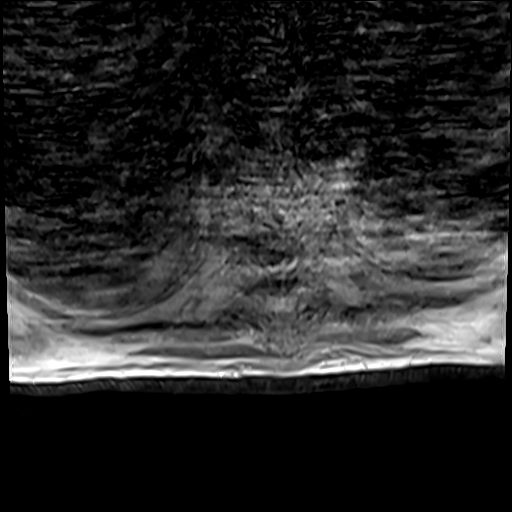
[im 26/39]
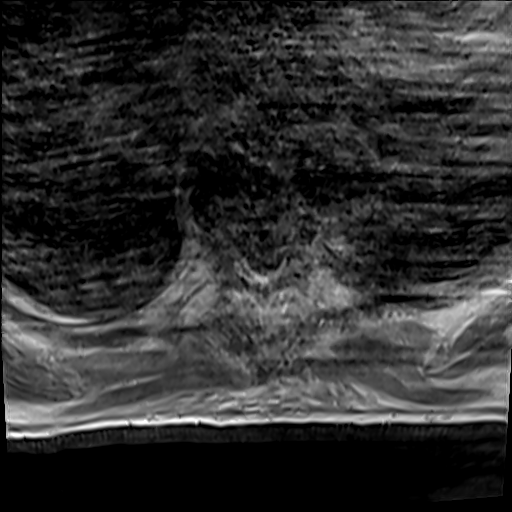
[im 32/39]
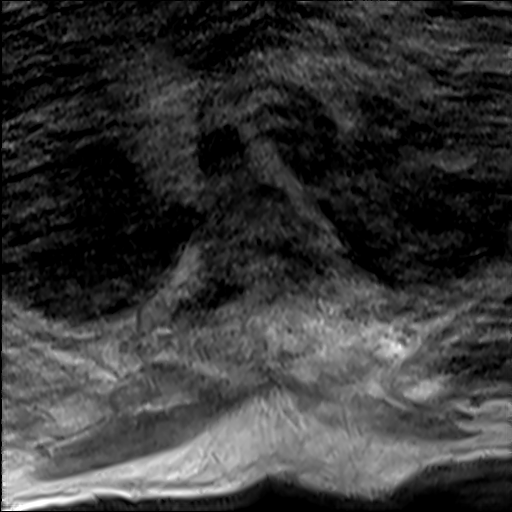
[im 39/39]
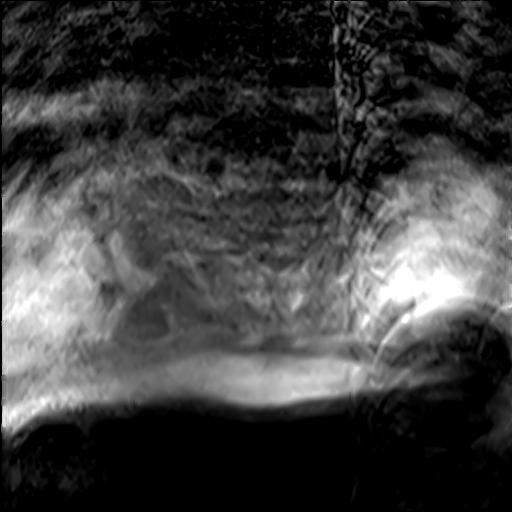

[28 of 48 positions shown; findings below may reference images not displayed]

FINDINGS: The patient could not tolerate the examination which was terminated
prematurely. IV contrast was not administered, and planned
postcontrast sequences (including sagittal T2 imaging) were not
obtained. The study is motion degraded throughout including severe
motion artifact on all axial thoracic spine sequences which are
largely nondiagnostic.

MRI THORACIC SPINE FINDINGS

Alignment:  No listhesis.

Vertebrae: As seen on the prior studies, there are multiple
sclerotic metastases with extensive involvement of the upper
thoracic spine (with the largest lesions involving the T3 and T4
vertebral bodies and T2-T4 posterior elements). No gross epidural
tumor is identified although assessment for small
volume/noncompressive epidural tumor is limited on this study.
Preserved vertebral body heights without a gross fracture
identified.

Cord:  No cord compression.

Paraspinal and other soft tissues: Small right and small to moderate
left pleural effusions.

Disc levels: Widespread bridging vertebral osteophytes in the
thoracic spine.

MRI LUMBAR SPINE FINDINGS

Segmentation:  Standard.

Alignment:  Normal.

Vertebrae: Large sclerotic lesions in the L1 and L5 vertebral
bodies. Additional sclerotic lesions partially visualized in the
sacrum and YOEL. No definite epidural tumor. Preserved vertebral
body heights without a fracture identified.

Conus medullaris and cauda equina: Conus ends at or above the T12
level and is poorly visualized. Cauda equina is grossly
unremarkable.

Paraspinal and other soft tissues: Grossly unremarkable.

Disc levels:

L1-2: Mild disc bulging and facet and ligamentum flavum hypertrophy
result in borderline to mild spinal stenosis without significant
neural foraminal stenosis.

L2-3: Mild disc bulging and mild facet and ligamentum flavum
hypertrophy without evidence of significant stenosis.

L3-4: Mild disc bulging and mild facet and ligamentum flavum
hypertrophy result in mild spinal stenosis, mild bilateral lateral
recess stenosis, and mild left neural foraminal stenosis.

L4-5: Minimal disc bulging and moderate facet hypertrophy result in
mild left neural foraminal stenosis without significant spinal
stenosis.

L5-S1: Asymmetric severe right facet arthrosis result in mild right
neural foraminal stenosis without spinal stenosis.
IMPRESSION: 1. Incomplete, motion degraded examination including largely
nondiagnostic axial thoracic spine imaging. No IV contrast
administered.
2. Widespread osseous metastases including extensive involvement of
the upper thoracic spine. No gross epidural tumor identified.
3. No thoracic spinal cord compression.
4. Lumbar spondylosis and facet arthrosis with mild spinal stenosis
at L3-4.

## 2020-06-04 MED ORDER — INSULIN ASPART 100 UNIT/ML ~~LOC~~ SOLN
5.0000 [IU] | Freq: Three times a day (TID) | SUBCUTANEOUS | Status: DC
  Administered 2020-06-04 – 2020-06-06 (×6): 5 [IU] via SUBCUTANEOUS

## 2020-06-04 MED ORDER — MAGNESIUM SULFATE 2 GM/50ML IV SOLN
2.0000 g | Freq: Once | INTRAVENOUS | Status: AC
  Administered 2020-06-04: 2 g via INTRAVENOUS
  Filled 2020-06-04: qty 50

## 2020-06-04 MED ORDER — SODIUM CHLORIDE 0.9 % IV SOLN
INTRAVENOUS | Status: DC | PRN
  Administered 2020-06-04 – 2020-06-05 (×2): 250 mL via INTRAVENOUS

## 2020-06-04 MED ORDER — ENOXAPARIN SODIUM 100 MG/ML ~~LOC~~ SOLN
100.0000 mg | Freq: Two times a day (BID) | SUBCUTANEOUS | Status: DC
  Administered 2020-06-04 – 2020-06-06 (×5): 100 mg via SUBCUTANEOUS
  Filled 2020-06-04 (×5): qty 1

## 2020-06-04 NOTE — Progress Notes (Addendum)
ANTICOAGULATION CONSULT NOTE  Pharmacy Consult for enoxaparin Indication: pulmonary embolus  No Known Allergies  Patient Measurements: Height: 6\' 3"  (190.5 cm) Weight: 107.3 kg (236 lb 8.9 oz) IBW/kg (Calculated) : 84.5 Heparin Dosing Weight:   Vital Signs: Temp: 97.4 F (36.3 C) (06/18 0140) Temp Source: Oral (06/18 0140) BP: 128/100 (06/18 0140) Pulse Rate: 81 (06/18 0140)  Labs: Recent Labs    06/02/20 1047 06/02/20 1047 06/02/20 1612 06/02/20 1612 06/02/20 1701 06/02/20 1701 06/02/20 1911 06/03/20 0200 06/04/20 0303  HGB 10.9*  --  10.9*   < > 10.9*   < >  --  10.6* 10.8*  HCT 36.2*   < > 35.1*   < > 32.0*  --   --  33.5* 33.9*  PLT 196  --  197  --   --   --   --  139* 133*  APTT 37*  --   --   --   --   --   --   --   --   LABPROT 18.6*  --   --   --   --   --   --   --   --   INR 1.6*  --   --   --   --   --   --   --   --   CREATININE 0.60*  --  0.47*   < > 0.40*  --   --  0.48* 0.46*  TROPONINIHS  --   --  9  --   --   --  8  --   --    < > = values in this interval not displayed.    Estimated Creatinine Clearance: 134.9 mL/min (A) (by C-G formula based on SCr of 0.46 mg/dL (L)).   Medical History: Past Medical History:  Diagnosis Date  . Constipation   . Diabetes mellitus without complication ([place])   . DVT femoral (deep venous thrombosis) with thrombophlebitis, right ([place]) 05/28/2020  . Dyspnea   . High cholesterol   . Hypertension   . Multiple pulmonary emboli ([place]) 04/20/2020  . Pancreatic cancer metastasized to liver ([place]) 03/12/2020  . Pancreatic cancer metastasized to lung ([place]) 03/12/2020    Medications:  Medications Prior to Admission  Medication Sig Dispense Refill Last Dose  . CREON 24000-76000 units CPEP TAKE 3 CAPSULE BY MOUTH IN THE MORNING, NOON, AND AT NIGHT AT BEDTIME (Patient taking differently: Take 2 capsules by mouth in the morning, at noon, and at bedtime. ) 90 capsule 1 06/02/2020 at Unknown time  . dexamethasone (DECADRON) 4  MG tablet Take 4 mg by mouth 2 (two) times daily with a meal. Takes 2 tabs PO daily, start day after chemo x 3 days with food.   06/01/2020 at Unknown time  . dronabinol (MARINOL) 5 MG capsule Take 1 capsule (5 mg total) by mouth 2 (two) times daily before lunch and supper. 60 capsule 0 06/01/2020 at Unknown time  . enoxaparin (LOVENOX) 120 MG/0.8ML injection Inject 0.67 mLs (100 mg total) into the skin every 12 (twelve) hours. 60 mL 0 06/02/2020 at 0800  . fentaNYL (DURAGESIC) 12 MCG/HR Place 1 patch onto the skin every 3 (three) days. 10 patch 0 06/02/2020 at Unknown time  . fluconazole (DIFLUCAN) 100 MG tablet Take 1 tablet (100 mg total) by mouth daily. 21 tablet 5 06/02/2020 at Unknown time  . gabapentin (NEURONTIN) 300 MG capsule Take 1 capsule (300 mg total) by mouth 3 (three) times daily. [place]  capsule 1 06/02/2020 at Unknown time  . HYDROcodone-acetaminophen (NORCO/VICODIN) 5-325 MG tablet Take 1-2 tablets by mouth every 6 (six) hours as needed for moderate pain or severe pain. 120 tablet 0 06/02/2020 at Unknown time  . HYDROcodone-homatropine (HYCODAN) 5-1.5 MG/5ML syrup Take 5 mLs by mouth every 6 (six) hours as needed for cough. 240 mL 0 unknown  . insulin aspart (NOVOLOG FLEXPEN) 100 UNIT/ML FlexPen Inject 15 Units into the skin 3 (three) times daily with meals. 15 mL 11 06/02/2020 at Unknown time  . loperamide (IMODIUM A-D) 2 MG capsule Take 1 capsule (2 mg total) by mouth as needed for diarrhea or loose stools. Take 2 tablets at onset of diarrhea, then 1 every 2 hours until 12 hours without a BM. 100 capsule 1 Past Week at Unknown time  . LORazepam (ATIVAN) 0.5 MG tablet TAKE 1 TABLET BY MOUTH EVERY 8 HOURS AS NEEDED FOR NAUSEA 30 tablet 0 Past Week at Unknown time  . metFORMIN (GLUCOPHAGE) 1000 MG tablet Take 1 tablet (1,000 mg total) by mouth 2 (two) times daily with a meal. (Patient taking differently: Take 1,000 mg by mouth daily with breakfast. ) 180 tablet 1 06/02/2020 at Unknown time  .  metoCLOPramide (REGLAN) 10 MG tablet Take 0.5 tablets (5 mg total) by mouth as needed for nausea (nausea/reflux). 30 tablet 3 unknown  . ondansetron (ZOFRAN-ODT) 8 MG disintegrating tablet Take 1 tablet (8 mg total) by mouth every 8 (eight) hours as needed for nausea. 40 tablet 3 unknown  . prochlorperazine (COMPAZINE) 10 MG tablet Take 1 tablet (10 mg total) by mouth every 6 (six) hours as needed for nausea or vomiting. 30 tablet 0 06/02/2020 at Unknown time  . rivaroxaban (XARELTO) 20 MG TABS tablet Take 1 tablet (20 mg total) by mouth daily with supper. 30 tablet 6 06/02/2020 at 0800  . atorvastatin (LIPITOR) 10 MG tablet Take 1 tablet (10 mg total) by mouth daily. (Patient not taking: Reported on 06/02/2020) 90 tablet 1 Not Taking at Unknown time  . Insulin Pen Needle (PEN NEEDLES) 30G X 8 MM MISC 1 each by Does not apply route as directed. 200 each 99   . lidocaine-prilocaine (EMLA) cream Apply 1 application topically as needed. Use on portacath as directed approx 1-2 hours prior to chemotherapy 30 g 0   . losartan (COZAAR) 25 MG tablet Take 1 tablet (25 mg total) by mouth daily. (Patient not taking: Reported on 06/03/2020) 90 tablet 1 Not Taking at Unknown time  . ondansetron (ZOFRAN) 8 MG tablet Take 1 tablet (8 mg total) by mouth 2 (two) times daily. As needed.  Start on Day 3 after chemotherapy. (Patient not taking: Reported on 06/02/2020) 30 tablet 1 Completed Course at Unknown time   Scheduled:  . Chlorhexidine Gluconate Cloth  6 each Topical Daily  . dexamethasone (DECADRON) injection  4 mg Intravenous Q6H  . dronabinol  5 mg Oral BID AC  . enoxaparin (LOVENOX) injection  100 mg Subcutaneous BID  . [START ON 06/05/2020] fentaNYL  1 patch Transdermal Q72H  . gabapentin  300 mg Oral TID  . insulin aspart  0-15 Units Subcutaneous TID WC  . insulin aspart  0-5 Units Subcutaneous QHS  . insulin aspart  3 Units Subcutaneous TID WC  . insulin glargine  12 Units Subcutaneous QHS  .  lipase/protease/amylase  24,000 Units Oral TID AC  . metoprolol tartrate  25 mg Oral Q6H  . sodium chloride flush  10-40 mL Intracatheter Q12H  Assessment: Patient on rivaroxaban and on 6/11 was suppose to change to enoxaparin.  Per MD note and med rec, patient was getting both enoxaparin and rivaroxaban with last dose for both noted 6/16 at 0800.  MD aware and only wants enoxaparin going forward and to schedule next dose based on last enoxaparin dose.  Patient with good renal function. 06/04/2020  Hg stable at 10.8, PLTC down to 133. Wt 107 kg. No bleeding reported.    Goal of Therapy:  Anti-Xa level 0.6-1 units/ml 4hrs after LMWH dose given Monitor platelets by anticoagulation protocol: Yes   Plan:  Enoxaparin 100mg  sq q12hr- use whole syringe size CBC q 3 days  Eudelia Bunch, Pharm.D 06/04/2020 9:26 AM

## 2020-06-04 NOTE — Telephone Encounter (Signed)
Error

## 2020-06-04 NOTE — Progress Notes (Signed)
PT Cancellation Note  Patient Details Name: Melvin [name] MRN: [id number] DOB: 25-Jan-1961   Cancelled Treatment:    Reason Eval/Treat Not Completed: Medical issues which prohibited therapy, PT requesting any guidance as notes reflect concern for cord progression and worrisome for paralysis. Will check back tomorrow. Please advise of any precautions.    Claretha Cooper 06/04/2020, 2:17 PM [place] Pager [phone] Office [phone]

## 2020-06-04 NOTE — Progress Notes (Signed)
I am glad that [name] is feeling a little bit better today.  He seems to sound little bit better.  He seems a sound a little bit stronger.  He can seem to move his legs okay.  He is not yet stood up or try to walk.  Overall, I still think that we are in a very difficult situation.  Clearly, this tumor of his is progressing.  3 months ago, there is no evidence of spinal metastasis.  Now, there is extensive spinal disease.  Surprisingly, his preop BUN is above 10.  It is 15.3.  I think he is on some steroids.  I know this is raising up his blood sugars but I suspect is making him feel a little bit better.  I did have a very long talk with him about the situation we are in.  Unfortunately, he has a pancreatic cancer that has a K-ras mutation that makes the cancer somewhat resilient to therapy.  I think he is showing [place] this already.  The FDA did approve a new oral agent for lung cancer that has the K-ras mutation.  I am not sure we could obtain this for pancreatic cancer.  The pill is easily over 10,000 a month.  He cannot afford this if it is not covered.  I think this would be our only chance of trying to help his cancer at this point.  I spoke to his wife on the phone.  I gave her an update as to what is going on.  I told her that I would be surprised if he makes it through July if we cannot help the tumor.  I know she understands this.  I know this is incredibly tough on her.  She is doing her best to try to help him out.  I spoke to [name] about possibly focusing on comfort issues.  He deserves comfort, respect and dignity.  I just would hate to see him suffer and exist.  This is what I would believe will happen if we cannot induce some kind of tumor response.  His body is just so weak.  His body is not going to be able to take systemic chemotherapy at this point.  Surprisingly, the prealbumin is 15.3.  This is a lot higher than I would have thought it would be.  As such, he still has some  reserve left that he might be able to handle treatment.  I did speak to him about end-of-life issues.  I explained to him that if he were to go on to a life support machine, he likely would never come off it because his body is just too weak and it would enjoyed having a machine do the work for it.  He understands this.  He DOES NOT want to be kept alive on a machine.  I totally agree with this.  As such, he is a DO NOT RESUSCITATE.  I will know if he is going to go over to [facility] for an MRI under sedation of his lumbosacral spine.  I would think the possibility of cord compression would be low but yet it would is worthwhile checking just to make sure there is no potential cord compression that could lead to paralysis.  If we do find something that might be suggestive of impending cord compression then I would probably consider palliative radiation to help her quality of life.  I really feel bad for [name].  He is doing everything we  asked him to do.  Just seems like every time we see him he is weaker and he has new issues.  He developed a pulmonary emboli.  He to develop the thrombus in the right leg.  For right now, I will see if we might be able to get the [place] -which is the K-ras inhibitor.  Again I think it would be very difficult to get this.  Even if we get it, there is no guarantee that it will work since he does not have lung cancer but pancreatic cancer.  I am spent about 45 minutes with him this morning.  I talked with his wife on the phone.  She knows exactly what is going on and where we might be headed.  Melvin Haw, MD  Psalm 23:4

## 2020-06-04 NOTE — Progress Notes (Addendum)
Progress Note  Patient Name: Melvin [name] Date of Encounter: 06/04/2020  [facility] Cardiologist: Elouise Munroe, MD   Subjective   No chest pain no SOB  Inpatient Medications    Scheduled Meds: . Chlorhexidine Gluconate Cloth  6 each Topical Daily  . dexamethasone (DECADRON) injection  4 mg Intravenous Q6H  . dronabinol  5 mg Oral BID AC  . enoxaparin (LOVENOX) injection  100 mg Subcutaneous BID  . [START ON 06/05/2020] fentaNYL  1 patch Transdermal Q72H  . gabapentin  300 mg Oral TID  . insulin aspart  0-15 Units Subcutaneous TID WC  . insulin aspart  0-5 Units Subcutaneous QHS  . insulin aspart  3 Units Subcutaneous TID WC  . insulin glargine  12 Units Subcutaneous QHS  . lipase/protease/amylase  24,000 Units Oral TID AC  . metoprolol tartrate  25 mg Oral Q6H  . sodium chloride flush  10-40 mL Intracatheter Q12H   Continuous Infusions:  PRN Meds: HYDROcodone-acetaminophen, HYDROcodone-homatropine, LORazepam, metoCLOPramide, morphine injection, sodium chloride flush   Vital Signs    Vitals:   06/03/20 1642 06/03/20 1859 06/03/20 2141 06/04/20 0140  BP: 139/90 (!) 159/110 118/83 (!) 128/100  Pulse: (!) 131 85 82 81  Resp: '20 14 18 18  '$ Temp: 98.1 F (36.7 C) 98.3 F (36.8 C) 98.1 F (36.7 C) (!) 97.4 F (36.3 C)  TempSrc: Oral Oral Oral Oral  SpO2: 96% 98% 92% 97%  Weight:      Height:        Intake/Output Summary (Last 24 hours) at 06/04/2020 1218 Last data filed at 06/04/2020 0600 Gross per 24 hour  Intake 0 ml  Output 850 ml  Net -850 ml   Last 3 Weights 06/03/2020 06/02/2020 06/02/2020  Weight (lbs) 236 lb 8.9 oz 231 lb (No Data)  Weight (kg) 107.3 kg 104.781 kg (No Data)      Telemetry    Not on tele. BB started yesterday   - Personally Reviewed  ECG    No new - Personally Reviewed  Physical Exam   GEN: No acute distress.   Neck: No JVD Cardiac: RRR, no murmurs, rubs, or gallops.  Respiratory: Clear to auscultation bilaterally. GI:  Soft, nontender, non-distended  MS: No edema; No deformity. Neuro:  Nonfocal  Psych: Normal affect   Labs    High Sensitivity Troponin:   Recent Labs  Lab 06/02/20 1612 06/02/20 1911  TROPONINIHS 9 8      Chemistry Recent Labs  Lab 06/02/20 1612 06/02/20 1612 06/02/20 1701 06/03/20 0200 06/04/20 0303  NA 143   < > 144 139 135  K 2.9*   < > 2.7* 3.8 3.5  CL 105   < > 100 100 99  CO2 30  --   --  29 29  GLUCOSE 256*   < > 229* 302* 276*  BUN 12   < > '10 10 10  '$ CREATININE 0.47*   < > 0.40* 0.48* 0.46*  CALCIUM 7.8*  --   --  7.8* 7.5*  PROT 5.7*  --   --   --  5.1*  ALBUMIN 2.5*  --   --   --  2.3*  AST 146*  --   --   --  82*  ALT 174*  --   --   --  123*  ALKPHOS 522*  --   --   --  513*  BILITOT 1.0  --   --   --  1.0  GFRNONAA >  60  --   --  >60 >60  GFRAA >60  --   --  >60 >60  ANIONGAP 8  --   --  10 7   < > = values in this interval not displayed.     Hematology Recent Labs  Lab 06/02/20 1612 06/02/20 1612 06/02/20 1701 06/03/20 0200 06/04/20 0303  WBC 3.9*  --   --  2.1* 4.5  RBC 3.56*  --   --  3.49* 3.58*  HGB 10.9*   < > 10.9* 10.6* 10.8*  HCT 35.1*   < > 32.0* 33.5* 33.9*  MCV 98.6  --   --  96.0 94.7  MCH 30.6  --   --  30.4 30.2  MCHC 31.1  --   --  31.6 31.9  RDW 18.0*  --   --  17.7* 17.4*  PLT 197  --   --  139* 133*   < > = values in this interval not displayed.    BNPNo results for input(s): BNP, PROBNP in the last 168 hours.   DDimer No results for input(s): DDIMER in the last 168 hours.   Radiology    CT Thoracic Spine Wo Contrast  Result Date: 06/02/2020 CLINICAL DATA:  Metastatic pancreatic adenocarcinoma. Back pain and bilateral leg weakness. EXAM: CT THORACIC SPINE WITHOUT CONTRAST TECHNIQUE: Multidetector CT images of the thoracic were obtained using the standard protocol without intravenous contrast. COMPARISON:  MRI thoracic spine from 06/02/2020 FINDINGS: Alignment: No vertebral subluxation is observed. Vertebrae:  Widespread osseous metastatic disease in the thoracic spine. Index left eccentric lesion at T3 measures 2.5 by 1.8 by 2.2 cm. Occasional rib lesions noted. Posterior element involvement at multiple levels notably at T1, T2, T3, and T4. Paraspinal and other soft tissues: Miliary nodules throughout the lungs probably from widespread metastatic disease to the lungs. If the patient is immunocompromised than miliary infection might also be considered. Small to moderate left and small right pleural effusions. Right Port-A-Cath tip: Cavoatrial junction. Aortic and coronary atherosclerosis. Scattered hypodense lesions in the liver compatible with metastatic disease. Pancreatic tail mass. Suspected perihepatic ascites. CT is not considered sensitive for epidural tumor. Disc levels: No significant osseous foraminal narrowing is identified in the thoracic spine. IMPRESSION: 1. Widespread sclerotic osseous metastatic disease in the thoracic spine. Please note that today's exam is not considered sensitive in ruling out epidural tumor. 2. Miliary nodules throughout the lungs probably from widespread metastatic disease to the lungs. If the patient is immunocompromised than miliary infection might also be considered. 3. Hepatic metastatic disease. 4. Pancreatic tail mass. 5. Small to moderate left and small right pleural effusions. 6. Suspected perihepatic ascites. 7. Aortic atherosclerosis. Aortic Atherosclerosis (ICD10-I70.0). Electronically Signed   By: Gaylyn Rong M.D.   On: 06/02/2020 19:53   CT Lumbar Spine Wo Contrast  Result Date: 06/02/2020 CLINICAL DATA:  Back pain. Bilateral leg weakness, right greater than left. Metastatic pancreatic cancer. EXAM: CT LUMBAR SPINE WITHOUT CONTRAST TECHNIQUE: Multidetector CT imaging of the lumbar spine was performed without intravenous contrast administration. Multiplanar CT image reconstructions were also generated. COMPARISON:  03/08/2020 CT abdomen FINDINGS: Segmentation:  The lowest lumbar type non-rib-bearing vertebra is labeled as L5. Alignment: No vertebral subluxation is observed. Vertebrae: Scattered new sclerotic metastatic lesions in the lumbar spine and bony pelvis. These include a 2.4 by 2.3 by 2.8 cm sclerotic lesion anteriorly in the L1 vertebral body; a 4.0 by 3.3 by 3.4 cm sclerotic lesion eccentric to the right in the L5  vertebral body, and multiple sclerotic lesions in the visualized iliac bones and sacrum, as well as some other smaller vertebral lesions. No current fracture.  Bridging spurring anteriorly at T11-T12-L1. Paraspinal and other soft tissues: Indistinct pancreatic tail compatible with mass, abutting the spleen. Aortoiliac atherosclerotic vascular disease. Disc levels: Facet and uncinate spurring contribute to osseous foraminal narrowing bilaterally at L4-5 and L5-S1, and potentially on the left at L2-3 and L3-4 as well. IMPRESSION: 1. New sclerotic metastatic lesions in the lumbar spine and bony pelvis, without fracture or vertebral subluxation. 2. Indistinct pancreatic tail compatible with mass, abutting the spleen. 3. Lumbar spondylosis and degenerative disc disease causing osseous foraminal narrowing bilaterally at L4-5 and L5-S1, and potentially on the left at L2-3 and L3-4 as well. 4. Aortic atherosclerosis. Aortic Atherosclerosis (ICD10-I70.0). Electronically Signed   By: Van Clines M.D.   On: 06/02/2020 19:47   MR THORACIC SPINE WO CONTRAST  Result Date: 06/02/2020 CLINICAL DATA:  Mid back pain with bilateral leg weakness. Metastatic pancreatic cancer. EXAM: MRI THORACIC SPINE WITHOUT CONTRAST (LIMITED) TECHNIQUE: Coronal and sagittal localizing images the spine were obtained. The patient was not able to complete the examination. No axial imaging was performed. No intravenous contrast was administered. COMPARISON:  Chest radiographs 05/28/2020 and chest CTA 04/20/2020. FINDINGS: Alignment:  Normal. Vertebrae: Widespread osseous metastatic  disease. In the thoracic spine, there are lesions within the T1, T2, T3, T4, T5 and T6 vertebral bodies. There is involvement of the posterior elements at T1, T2 and T3. No significant pathologic fracture identified, although a small amount of epidural tumor difficult to exclude at T3. Metastases are also present within the L1 and L5 vertebral bodies. Cord:  No gross cord compression on localizing images. IMPRESSION: 1. Extremely limited study terminated after the scout images were obtained. 2. Widespread osseous metastatic disease throughout the thoracic spine as described. No significant pathologic fracture identified, although a small amount of epidural tumor difficult to exclude at T3. 3. No gross cord compression on localizing images. 4. Consider further evaluation with CT or repeat MR after appropriate sedation. Electronically Signed   By: Richardean Sale M.D.   On: 06/02/2020 17:57   ECHOCARDIOGRAM COMPLETE  Result Date: 06/03/2020    ECHOCARDIOGRAM REPORT   Patient Name:   LANG ZINGG Date of Exam: 06/03/2020 Medical Rec #:  [id number]   Height:       75.0 in Accession #:    [id number]  Weight:       236.6 lb Date of Birth:  1961-10-01  BSA:          2.357 m Patient Age:    37 years    BP:           154/101 mmHg Patient Gender: M           HR:           136 bpm. Exam Location:  Inpatient Procedure: 2D Echo, Cardiac Doppler and Color Doppler Indications:    Tachycardia  History:        Patient has no prior history of Echocardiogram examinations.                 Risk Factors:Diabetes, Dyslipidemia and Former Smoker. DVT.  Sonographer:    Vickie Epley RDCS Referring Phys: [phone] A CALDWELL [place]  1. Left ventricular ejection fraction, by estimation, is 70 to 75%. The left ventricle has hyperdynamic function. The left ventricle has no regional wall motion abnormalities. Left ventricular diastolic parameters are consistent  with Grade I diastolic dysfunction (impaired relaxation).  2. Right  ventricular systolic function is hyperdynamic. The right ventricular size is normal.  3. Left atrial size was mild to moderately dilated.  4. The mitral valve is normal in structure. Trivial mitral valve regurgitation.  5. The aortic valve is normal in structure. Aortic valve regurgitation is not visualized.  6. Aortic dilatation noted. There is borderline dilatation of the aortic root and of the ascending aorta.  7. The inferior vena cava is normal in size with greater than 50% respiratory variability, suggesting right atrial pressure of 3 mmHg. FINDINGS  Left Ventricle: Left ventricular ejection fraction, by estimation, is 70 to 75%. The left ventricle has hyperdynamic function. The left ventricle has no regional wall motion abnormalities. The left ventricular internal cavity size was normal in size. There is no left ventricular hypertrophy. Left ventricular diastolic parameters are consistent with Grade I diastolic dysfunction (impaired relaxation). Right Ventricle: The right ventricular size is normal. No increase in right ventricular wall thickness. Right ventricular systolic function is hyperdynamic. Left Atrium: Left atrial size was mild to moderately dilated. Right Atrium: Right atrial size was normal in size. Pericardium: There is no evidence of pericardial effusion. Mitral Valve: The mitral valve is normal in structure. Trivial mitral valve regurgitation. Tricuspid Valve: The tricuspid valve is normal in structure. Tricuspid valve regurgitation is trivial. Aortic Valve: The aortic valve is normal in structure. Aortic valve regurgitation is not visualized. Pulmonic Valve: The pulmonic valve was normal in structure. Pulmonic valve regurgitation is trivial. Aorta: Aortic dilatation noted. There is borderline dilatation of the aortic root and of the ascending aorta. Venous: The inferior vena cava is normal in size with greater than 50% respiratory variability, suggesting right atrial pressure of 3 mmHg.  IAS/Shunts: No atrial level shunt detected by color flow Doppler.  LEFT VENTRICLE PLAX 2D LVIDd:         4.23 cm LVIDs:         3.17 cm LV PW:         0.96 cm LV IVS:        0.98 cm LVOT diam:     2.30 cm LV SV:         52 LV SV Index:   22 LVOT Area:     4.15 cm  LV Volumes (MOD) LV vol d, MOD A2C: 77.1 ml LV vol d, MOD A4C: 78.5 ml LV vol s, MOD A2C: 34.9 ml LV vol s, MOD A4C: 34.1 ml LV SV MOD A2C:     42.2 ml LV SV MOD A4C:     78.5 ml LV SV MOD BP:      46.0 ml RIGHT VENTRICLE RV S prime:     12.40 cm/s TAPSE (M-mode): 1.5 cm LEFT ATRIUM             Index       RIGHT ATRIUM           Index LA diam:        3.90 cm 1.65 cm/m  RA Area:     13.90 cm LA Vol (A2C):   47.6 ml 20.20 ml/m RA Volume:   28.60 ml  12.14 ml/m LA Vol (A4C):   46.3 ml 19.65 ml/m LA Biplane Vol: 47.7 ml 20.24 ml/m  AORTIC VALVE LVOT Vmax:   81.40 cm/s LVOT Vmean:  60.100 cm/s LVOT VTI:    0.125 m  AORTA Ao Root diam: 4.20 cm Ao Asc diam:  4.10 cm  SHUNTS  Systemic VTI:  0.12 m Systemic Diam: 2.30 cm Glori Bickers MD Electronically signed by Glori Bickers MD Signature Date/Time: 06/03/2020/2:39:21 PM    Final    DG FEMUR, MIN 2 VIEWS RIGHT  Result Date: 06/02/2020 CLINICAL DATA:  Clot in right leg EXAM: RIGHT FEMUR 2 VIEWS COMPARISON:  None. FINDINGS: There is no evidence of fracture or other focal bone lesions. Soft tissues are unremarkable. IMPRESSION: Negative. Electronically Signed   By: Donavan Foil M.D.   On: 06/02/2020 19:34    Cardiac Studies   ECHO 06/03/20 IMPRESSIONS    1. Left ventricular ejection fraction, by estimation, is 70 to 75%. The  left ventricle has hyperdynamic function. The left ventricle has no  regional wall motion abnormalities. Left ventricular diastolic parameters  are consistent with Grade I diastolic  dysfunction (impaired relaxation).  2. Right ventricular systolic function is hyperdynamic. The right  ventricular size is normal.  3. Left atrial size was mild to moderately  dilated.  4. The mitral valve is normal in structure. Trivial mitral valve  regurgitation.  5. The aortic valve is normal in structure. Aortic valve regurgitation is  not visualized.  6. Aortic dilatation noted. There is borderline dilatation of the aortic  root and of the ascending aorta.  7. The inferior vena cava is normal in size with greater than 50%  respiratory variability, suggesting right atrial pressure of 3 mmHg.   FINDINGS  Left Ventricle: Left ventricular ejection fraction, by estimation, is 70  to 75%. The left ventricle has hyperdynamic function. The left ventricle  has no regional wall motion abnormalities. The left ventricular internal  cavity size was normal in size.  There is no left ventricular hypertrophy. Left ventricular diastolic  parameters are consistent with Grade I diastolic dysfunction (impaired  relaxation).   Right Ventricle: The right ventricular size is normal. No increase in  right ventricular wall thickness. Right ventricular systolic function is  hyperdynamic.   Left Atrium: Left atrial size was mild to moderately dilated.   Right Atrium: Right atrial size was normal in size.   Pericardium: There is no evidence of pericardial effusion.   Mitral Valve: The mitral valve is normal in structure. Trivial mitral  valve regurgitation.   Tricuspid Valve: The tricuspid valve is normal in structure. Tricuspid  valve regurgitation is trivial.   Aortic Valve: The aortic valve is normal in structure. Aortic valve  regurgitation is not visualized.   Pulmonic Valve: The pulmonic valve was normal in structure. Pulmonic valve  regurgitation is trivial.   Aorta: Aortic dilatation noted. There is borderline dilatation of the  aortic root and of the ascending aorta.   Venous: The inferior vena cava is normal in size with greater than 50%  respiratory variability, suggesting right atrial pressure of 3 mmHg.   IAS/Shunts: No atrial level shunt  detected by color flow Doppler.   Patient Profile     59 y.o. male with a hx of DM, HTN, HLD, pancreatic CA w/ mets to liver/lung, dx saddle embolus PE 04/20/2020 place on Xarleto 20 mg qd, dx DVT 05/28/2020 >>lovenox and now tachycardia.    Assessment & Plan     1.  Tachycardia: -He is asymptomatic despite multiple runs of SVT and VT. -Baseline heart rate is elevated secondary to acute illness, HR is improved today continue BB -Echo is stable to hyperdynamic EF 70-75% and G1DD -TSH 0.554   2.  PE/DVT: -Patient was diagnosed with acute PE 04/20/2020, he was started on Xarelto 20  mg daily (should have been 15 mg twice daily) -When patient was diagnosed with DVT 05/28/2020, Xarelto was changed to Lovenox.  However, his wife continued to give him both until he was admitted -He is currently on therapeutic Lovenox   3.  Lower extremity weakness -Reason for admission, No definitive cause yet determined  4.  Hypertension: -According to patient's wife, his losartan 25 mg was recently stopped as well as atorvastatin. Would not resume  -SBP has been 118 to 159 in the last 24 hours, now on beta-blocker   5.    Pancreatic cancer that has a K-ras mutation that makes the cancer somewhat resilient to therapy - Dr. Marin Olp saw this AM and pt is now DNR.  Plan to try to obtain Lumakras -which is the K-ras inhibitor.  Again per Dr. Marin Olp thinks it would be very difficult to get this.  For questions or updates, please contact [place] Please consult [email or web address] for contact info under        Signed, Cecilie Kicks, NP  06/04/2020, 12:18 PM    Patient seen and examined with Cecilie Kicks, NP.  Agree as above, with the following exceptions and changes as noted below. He is feeling better today, spirits are up and he is eating lunch with his wife. Gen: NAD, CV: RRR, no murmurs, Lungs: clear, Abd: soft, Extrem: Warm, well perfused, no edema, Neuro/Psych: alert and oriented x 3, normal mood and  affect. All available labs, radiology testing, previous records reviewed. Metoprolol started yesterday, patient is now DNR and off of telemetry. OK to continue beta blocker unless hypotension or bradycardia. Patient and wife in agreement with that plan.   Cardiology will sign off but we are happy to assist at any time as needed.  Elouise Munroe, MD 06/04/20 8:50 PM

## 2020-06-04 NOTE — Progress Notes (Signed)
PROGRESS NOTE    Melvin [name]  LHT:[id number] DOB: 24-Feb-1961 DOA: 06/02/2020 PCP: Emeterio Reeve, DO   Chief Complaint  Patient presents with  . Blood clot  . Nausea   Brief Narrative:  Melvin [name] is Melvin [name] 59 y.o. male with medical history significant for Pancreatic cancer with metastasis to liver, lung and lymph nodes, recent PE and DVT, type 2 diabetes and hyperlipidemia who presents with recurrent falls and bilateral lower extremity weakness.  Wife at bedside provides most of the history as patient was fatigued and asleep during most of the evaluation.  She reports that for the past several weeks he has continued to have recurrent falls due to bilateral lower extremity weakness.  She had to go out and buy him Melvin [name] and even with that he was continuing to have trouble keeping his balance.  He reports his right leg being weaker and also feels numbness to the entire right extremity below the hip.  He fell in the bathtub this past week and has had ankle swelling.  Denies any saddle anesthesia or bowel or bladder incontinence.  Wife also reports that he had Melvin [name] fever up to 102 two days ago after receiving Melvin [name] Lovenox shot but she just gave him 2 Tylenols and he has not had any fever since.  Denies any coughing or runny nose.  He has chronic shortness of breath due to metastasis of cancer to his lungs.  Denies any nausea, vomiting or diarrhea.  His last bowel movement was  2 days ago.  Reportedly patient was started on Xarelto for Melvin [name] saddle embolus pulmonary embolism found on 04/20/2020.  He was also diagnosed with an extensive right DVT on 6/11 and started on Lovenox last week.  However wife has been doing both Lovenox and Xarelto.  She denies noticing any bleeding.  In the ED, he was afebrile, mildly hypertensive on room air. No leukocytosis.  Has stable anemia.  K of 2.9.  Glucose of 256.  AST elevated 146 and ALT of 174 with alkaline phosphatase of 522 which appears to be chronic for  him. Lactate of 2.7.  CT lumbar spine shows new sclerotic metastatic lesion in the lumbar spine and bony pelvis without any fracture or subluxation.  There is also lumbar spondylosis and DDD causing osseous foraminal narrowing bilaterally at L4-5 and L5-S1 potentially on the left L2-3 and L3-4.  CT thoracic shows widespread sclerotic osseous metastatic disease in the thoracic spine.  Patient not able to tolerate MRI due to anxiety and refused.   Assessment & Plan:   Principal Problem:   Lower extremity weakness Active Problems:   Type 2 diabetes mellitus with diabetic polyneuropathy, without long-term current use of insulin (HCC)   DVT femoral (deep venous thrombosis) with thrombophlebitis, right (HCC)   Primary pancreatic cancer with metastasis to other site [facility])   Hypokalemia  Bilateral lower extremity weakness  Concern for Cord Compression in Setting of Metastatic Disease:  CT L and T spine with widespread sclerotic osseous metastatic disease in thoracic spine as well as new sclerotic metastatic lesions in the lumbar spine and bony pelvis did not tolerate MRI on admission, will try again with higher dose of ativan (2 mg)  Continue decadron IV Appreciate oncology recommendations -> discussed prognosis and GOC with Melvin [name] this AM.  Pt now DNR.  Looking into possibility of Lumakras.    History of PE/recent DVT Wife was doing both Xarelto and Lovenox.  Will only continue Lovenox for now since he appeared  to have failed Xarelto and developed DVT while on it.  Tachycardia: cardiology c/s, appreciate recs.  Sinus tach with PVC's on EKG.  Cardiology c/s, appreciate recs TSH wnl Echo with EF 09-32%, grade 1 diastolic dysfunction   Type 2 diabetes Basal and bolus insulin Adjust with steroids A1c 04/2020 8.9  Hypokalemia Repleted.  Will recheck in the morning.  Elevated lactate Resolved  History of metastatic pancreatic cancer Continue Creon, Marinol  Elevated LFTs:  likely related to metastatic disease, follow  DVT prophylaxis: lovenox Code Status: full  Family Communication: none at bedside Disposition:   Status is: Inpatient  Remains inpatient appropriate because:Inpatient level of care appropriate due to severity of illness   Dispo: The patient is from: Home              Anticipated d/c is to: Home              Anticipated d/c date is: > 3 days              Patient currently is not medically stable to d/c.   Consultants:   oncology  Procedures:  Echo IMPRESSIONS    1. Left ventricular ejection fraction, by estimation, is 70 to 75%. The  left ventricle has hyperdynamic function. The left ventricle has no  regional wall motion abnormalities. Left ventricular diastolic parameters  are consistent with Grade I diastolic  dysfunction (impaired relaxation).  2. Right ventricular systolic function is hyperdynamic. The right  ventricular size is normal.  3. Left atrial size was mild to moderately dilated.  4. The mitral valve is normal in structure. Trivial mitral valve  regurgitation.  5. The aortic valve is normal in structure. Aortic valve regurgitation is  not visualized.  6. Aortic dilatation noted. There is borderline dilatation of the aortic  root and of the ascending aorta.  7. The inferior vena cava is normal in size with greater than 50%  respiratory variability, suggesting right atrial pressure of 3 mmHg.   Antimicrobials:  Anti-infectives (From admission, onward)   None     Subjective: Was able to stand today, maybe better?  Objective: Vitals:   06/03/20 1642 06/03/20 1859 06/03/20 2141 06/04/20 0140  BP: 139/90 (!) 159/110 118/83 (!) 128/100  Pulse: (!) 131 85 82 81  Resp: 20 14 18 18   Temp: 98.1 F (36.7 C) 98.3 F (36.8 C) 98.1 F (36.7 C) (!) 97.4 F (36.3 C)  TempSrc: Oral Oral Oral Oral  SpO2: 96% 98% 92% 97%  Weight:      Height:        Intake/Output Summary (Last 24 hours) at 06/04/2020  1456 Last data filed at 06/04/2020 0600 Gross per 24 hour  Intake 0 ml  Output 850 ml  Net -850 ml   Filed Weights   06/02/20 1544 06/03/20 0135  Weight: 104.8 kg 107.3 kg    Examination:  General: No acute distress. Cardiovascular: Heart sounds show Platon Arocho regular rate, and rhythm. Lungs: Clear to auscultation bilaterally  Abdomen: Soft, nontender, nondistended  Neurological: Alert and oriented 3. Moves all extremities 4.  4/5 hip flexor strength bilaterally. Cranial nerves II through XII grossly intact. Skin: Warm and dry. No rashes or lesions. Extremities: No clubbing or cyanosis. No edema.    Data Reviewed: I have personally reviewed following labs and imaging studies  CBC: Recent Labs  Lab 06/02/20 1047 06/02/20 1612 06/02/20 1701 06/03/20 0200 06/04/20 0303  WBC 4.4 3.9*  --  2.1* 4.5  NEUTROABS 2.9 2.4  --   --  3.5  HGB 10.9* 10.9* 10.9* 10.6* 10.8*  HCT 36.2* 35.1* 32.0* 33.5* 33.9*  MCV 97.8 98.6  --  96.0 94.7  PLT 196 197  --  139* 133*    Basic Metabolic Panel: Recent Labs  Lab 06/02/20 1047 06/02/20 1612 06/02/20 1701 06/03/20 0200 06/04/20 0303  NA 142 143 144 139 135  K 3.2* 2.9* 2.7* 3.8 3.5  CL 101 105 100 100 99  CO2 33* 30  --  29 29  GLUCOSE 397* 256* 229* 302* 276*  BUN 12 12 10 10 10   CREATININE 0.60* 0.47* 0.40* 0.48* 0.46*  CALCIUM 8.4* 7.8*  --  7.8* 7.5*  MG  --   --   --   --  1.5*  PHOS  --   --   --   --  3.5    GFR: Estimated Creatinine Clearance: 134.9 mL/min (Jaidyn Kuhl) (by C-G formula based on SCr of 0.46 mg/dL (L)).  Liver Function Tests: Recent Labs  Lab 06/02/20 1612 06/04/20 0303  AST 146* 82*  ALT 174* 123*  ALKPHOS 522* 513*  BILITOT 1.0 1.0  PROT 5.7* 5.1*  ALBUMIN 2.5* 2.3*    CBG: Recent Labs  Lab 06/03/20 1121 06/03/20 1639 06/03/20 2140 06/04/20 0813 06/04/20 1218  GLUCAP 337* 298* 275* 104* 381*     Recent Results (from the past 240 hour(s))  Culture, Urine     Status: Abnormal   Collection  Time: 06/02/20  1:49 PM   Specimen: Urine, Clean Catch  Result Value Ref Range Status   Specimen Description   Final    URINE, CLEAN CATCH Performed at [facility] at [facility], [address] [place], [place] [address]    Special Requests   Final    NONE Performed at [facility], [address] [place], [place] [address]    Culture (Makeila Yamaguchi)  Final    <10,000 COLONIES/mL INSIGNIFICANT GROWTH Performed at [facility], [place] [address] [place], [place] [address]    Report Status 06/03/2020 FINAL  Final  Blood culture (routine x 2)     Status: None (Preliminary result)   Collection Time: 06/02/20  4:50 PM   Specimen: BLOOD  Result Value Ref Range Status   Specimen Description   Final    BLOOD RIGHT ANTECUBITAL Performed at [facility] [address] [place], [place] [address]    Special Requests   Final    BOTTLES DRAWN AEROBIC AND ANAEROBIC Blood Culture results may not be optimal due to an excessive volume of blood received in culture bottles Performed at [facility] [address], [place], [place] [address]    Culture   Final    NO GROWTH 2 DAYS Performed at [facility] [address] [place], [place] [address]    Report Status PENDING  Incomplete  SARS Coronavirus 2 by RT PCR (hospital order, performed in [facility] hospital lab) Nasopharyngeal Nasopharyngeal Swab     Status: None   Collection Time: 06/02/20  5:22 PM   Specimen: Nasopharyngeal Swab  Result Value Ref Range Status   SARS Coronavirus 2 NEGATIVE NEGATIVE Final    Comment: (NOTE) SARS-CoV-2 target nucleic acids are NOT DETECTED.  The SARS-CoV-2 RNA is generally detectable in upper and lower respiratory specimens during the acute phase of infection. The lowest concentration of SARS-CoV-2 viral copies this assay can detect is 250 copies /  mL. Isabella Roemmich negative result does not preclude  SARS-CoV-2 infection and should not be used as the sole basis for treatment or other patient management decisions.  Zori Benbrook negative result may occur with improper specimen collection / handling, submission of specimen other than nasopharyngeal swab, presence of viral mutation(s) within the areas targeted by this assay, and inadequate number of viral copies (<250 copies / mL). Teran Daughenbaugh negative result must be combined with clinical observations, patient history, and epidemiological information.  Fact Sheet for Patients:   StrictlyIdeas.no  Fact Sheet for Healthcare Providers: BankingDealers.co.za  This test is not yet approved or  cleared by the [place] FDA and has been authorized for detection and/or diagnosis of SARS-CoV-2 by FDA under an Emergency Use Authorization (EUA).  This EUA will remain in effect (meaning this test can be used) for the duration of the COVID-19 declaration under Section 564(b)(1) of the Act, 21 U.S.C. section 360bbb-3(b)(1), unless the authorization is terminated or revoked sooner.  Performed at [facility], [place] [address] [place], [place] [address]   Blood culture (routine x 2)     Status: None (Preliminary result)   Collection Time: 06/02/20  6:02 PM   Specimen: BLOOD  Result Value Ref Range Status   Specimen Description   Final    BLOOD LEFT ANTECUBITAL Performed at [facility] [address], [place], [place] [address]    Special Requests   Final    BOTTLES DRAWN AEROBIC AND ANAEROBIC Blood Culture adequate volume Performed at [facility] [address], [place], [place] [address]    Culture   Final    NO GROWTH 2 DAYS Performed at [facility] [address], [place], [place] [address]    Report Status PENDING  Incomplete         Radiology Studies: CT Thoracic Spine Wo Contrast  Result Date: 06/02/2020 CLINICAL DATA:   Metastatic pancreatic adenocarcinoma. Back pain and bilateral leg weakness. EXAM: CT THORACIC SPINE WITHOUT CONTRAST TECHNIQUE: Multidetector CT images of the thoracic were obtained using the standard protocol without intravenous contrast. COMPARISON:  MRI thoracic spine from 06/02/2020 FINDINGS: Alignment: No vertebral subluxation is observed. Vertebrae: Widespread osseous metastatic disease in the thoracic spine. Index left eccentric lesion at T3 measures 2.5 by 1.8 by 2.2 cm. Occasional rib lesions noted. Posterior element involvement at multiple levels notably at T1, T2, T3, and T4. Paraspinal and other soft tissues: Miliary nodules throughout the lungs probably from widespread metastatic disease to the lungs. If the patient is immunocompromised than miliary infection might also be considered. Small to moderate left and small right pleural effusions. Right Port-Eletha Culbertson-Cath tip: Cavoatrial junction. Aortic and coronary atherosclerosis. Scattered hypodense lesions in the liver compatible with metastatic disease. Pancreatic tail mass. Suspected perihepatic ascites. CT is not considered sensitive for epidural tumor. Disc levels: No significant osseous foraminal narrowing is identified in the thoracic spine. IMPRESSION: 1. Widespread sclerotic osseous metastatic disease in the thoracic spine. Please note that today's exam is not considered sensitive in ruling out epidural tumor. 2. Miliary nodules throughout the lungs probably from widespread metastatic disease to the lungs. If the patient is immunocompromised than miliary infection might also be considered. 3. Hepatic metastatic disease. 4. Pancreatic tail mass. 5. Small to moderate left and small right pleural effusions. 6. Suspected perihepatic ascites. 7. Aortic atherosclerosis. Aortic Atherosclerosis (ICD10-I70.0). Electronically Signed   By: Van Clines M.D.   On: 06/02/2020 19:53   CT Lumbar Spine Wo Contrast  Result Date: 06/02/2020 CLINICAL DATA:  Back  pain. Bilateral leg weakness, right greater than left. Metastatic pancreatic cancer. EXAM: CT LUMBAR SPINE WITHOUT CONTRAST TECHNIQUE: Multidetector CT imaging of the lumbar spine was performed without intravenous contrast administration. Multiplanar CT image reconstructions were also generated. COMPARISON:  03/08/2020 CT abdomen FINDINGS: Segmentation: The lowest lumbar type non-rib-bearing vertebra is labeled as L5. Alignment: No vertebral subluxation is observed. Vertebrae: Scattered new sclerotic metastatic lesions in the lumbar spine and bony pelvis. These include Sonnia Strong 2.4 by 2.3 by 2.8 cm sclerotic lesion anteriorly in the L1 vertebral body; Cait Locust 4.0 by 3.3 by 3.4 cm sclerotic lesion eccentric to the right in the L5 vertebral body, and multiple sclerotic lesions in the visualized iliac bones and sacrum, as well as some other smaller vertebral lesions. No current fracture.  Bridging spurring anteriorly at T11-T12-L1. Paraspinal and other soft tissues: Indistinct pancreatic tail compatible with mass, abutting the spleen. Aortoiliac atherosclerotic vascular disease. Disc levels: Facet and uncinate spurring contribute to osseous foraminal narrowing bilaterally at L4-5 and L5-S1, and potentially on the left at L2-3 and L3-4 as well. IMPRESSION: 1. New sclerotic metastatic lesions in the lumbar spine and bony pelvis, without fracture or vertebral subluxation. 2. Indistinct pancreatic tail compatible with mass, abutting the spleen. 3. Lumbar spondylosis and degenerative disc disease causing osseous foraminal narrowing bilaterally at L4-5 and L5-S1, and potentially on the left at L2-3 and L3-4 as well. 4. Aortic atherosclerosis. Aortic Atherosclerosis (ICD10-I70.0). Electronically Signed   By: Van Clines M.D.   On: 06/02/2020 19:47   MR THORACIC SPINE WO CONTRAST  Result Date: 06/02/2020 CLINICAL DATA:  Mid back pain with bilateral leg weakness. Metastatic pancreatic cancer. EXAM: MRI THORACIC SPINE WITHOUT  CONTRAST (LIMITED) TECHNIQUE: Coronal and sagittal localizing images the spine were obtained. The patient was not able to complete the examination. No axial imaging was performed. No intravenous contrast was administered. COMPARISON:  Chest radiographs 05/28/2020 and chest CTA 04/20/2020. FINDINGS: Alignment:  Normal. Vertebrae: Widespread osseous metastatic disease. In the thoracic spine, there are lesions within the T1, T2, T3, T4, T5 and T6 vertebral bodies. There is involvement of the posterior elements at T1, T2 and T3. No significant pathologic fracture identified, although Ahmiyah Coil small amount of epidural tumor difficult to exclude at T3. Metastases are also present within the L1 and L5 vertebral bodies. Cord:  No gross cord compression on localizing images. IMPRESSION: 1. Extremely limited study terminated after the scout images were obtained. 2. Widespread osseous metastatic disease throughout the thoracic spine as described. No significant pathologic fracture identified, although Kingson Lohmeyer small amount of epidural tumor difficult to exclude at T3. 3. No gross cord compression on localizing images. 4. Consider further evaluation with CT or repeat MR after appropriate sedation. Electronically Signed   By: Richardean Sale M.D.   On: 06/02/2020 17:57   ECHOCARDIOGRAM COMPLETE  Result Date: 06/03/2020    ECHOCARDIOGRAM REPORT   Patient Name:   YESHAYA VATH Date of Exam: 06/03/2020 Medical Rec #:  [id number]   Height:       75.0 in Accession #:    [id number]  Weight:       236.6 lb Date of Birth:  August 10, 1961  BSA:          2.357 m Patient Age:    5 years    BP:           154/101 mmHg Patient Gender: M           HR:  136 bpm. Exam Location:  Inpatient Procedure: 2D Echo, Cardiac Doppler and Color Doppler Indications:    Tachycardia  History:        Patient has no prior history of Echocardiogram examinations.                 Risk Factors:Diabetes, Dyslipidemia and Former Smoker. DVT.  Sonographer:    Vickie Epley  RDCS Referring Phys: [phone] Tjay Velazquez CALDWELL [place]  1. Left ventricular ejection fraction, by estimation, is 70 to 75%. The left ventricle has hyperdynamic function. The left ventricle has no regional wall motion abnormalities. Left ventricular diastolic parameters are consistent with Grade I diastolic dysfunction (impaired relaxation).  2. Right ventricular systolic function is hyperdynamic. The right ventricular size is normal.  3. Left atrial size was mild to moderately dilated.  4. The mitral valve is normal in structure. Trivial mitral valve regurgitation.  5. The aortic valve is normal in structure. Aortic valve regurgitation is not visualized.  6. Aortic dilatation noted. There is borderline dilatation of the aortic root and of the ascending aorta.  7. The inferior vena cava is normal in size with greater than 50% respiratory variability, suggesting right atrial pressure of 3 mmHg. FINDINGS  Left Ventricle: Left ventricular ejection fraction, by estimation, is 70 to 75%. The left ventricle has hyperdynamic function. The left ventricle has no regional wall motion abnormalities. The left ventricular internal cavity size was normal in size. There is no left ventricular hypertrophy. Left ventricular diastolic parameters are consistent with Grade I diastolic dysfunction (impaired relaxation). Right Ventricle: The right ventricular size is normal. No increase in right ventricular wall thickness. Right ventricular systolic function is hyperdynamic. Left Atrium: Left atrial size was mild to moderately dilated. Right Atrium: Right atrial size was normal in size. Pericardium: There is no evidence of pericardial effusion. Mitral Valve: The mitral valve is normal in structure. Trivial mitral valve regurgitation. Tricuspid Valve: The tricuspid valve is normal in structure. Tricuspid valve regurgitation is trivial. Aortic Valve: The aortic valve is normal in structure. Aortic valve regurgitation is not  visualized. Pulmonic Valve: The pulmonic valve was normal in structure. Pulmonic valve regurgitation is trivial. Aorta: Aortic dilatation noted. There is borderline dilatation of the aortic root and of the ascending aorta. Venous: The inferior vena cava is normal in size with greater than 50% respiratory variability, suggesting right atrial pressure of 3 mmHg. IAS/Shunts: No atrial level shunt detected by color flow Doppler.  LEFT VENTRICLE PLAX 2D LVIDd:         4.23 cm LVIDs:         3.17 cm LV PW:         0.96 cm LV IVS:        0.98 cm LVOT diam:     2.30 cm LV SV:         52 LV SV Index:   22 LVOT Area:     4.15 cm  LV Volumes (MOD) LV vol d, MOD A2C: 77.1 ml LV vol d, MOD A4C: 78.5 ml LV vol s, MOD A2C: 34.9 ml LV vol s, MOD A4C: 34.1 ml LV SV MOD A2C:     42.2 ml LV SV MOD A4C:     78.5 ml LV SV MOD BP:      46.0 ml RIGHT VENTRICLE RV S prime:     12.40 cm/s TAPSE (M-mode): 1.5 cm LEFT ATRIUM             Index  RIGHT ATRIUM           Index LA diam:        3.90 cm 1.65 cm/m  RA Area:     13.90 cm LA Vol (A2C):   47.6 ml 20.20 ml/m RA Volume:   28.60 ml  12.14 ml/m LA Vol (A4C):   46.3 ml 19.65 ml/m LA Biplane Vol: 47.7 ml 20.24 ml/m  AORTIC VALVE LVOT Vmax:   81.40 cm/s LVOT Vmean:  60.100 cm/s LVOT VTI:    0.125 m  AORTA Ao Root diam: 4.20 cm Ao Asc diam:  4.10 cm  SHUNTS Systemic VTI:  0.12 m Systemic Diam: 2.30 cm Glori Bickers MD Electronically signed by Glori Bickers MD Signature Date/Time: 06/03/2020/2:39:21 PM    Final    DG FEMUR, MIN 2 VIEWS RIGHT  Result Date: 06/02/2020 CLINICAL DATA:  Clot in right leg EXAM: RIGHT FEMUR 2 VIEWS COMPARISON:  None. FINDINGS: There is no evidence of fracture or other focal bone lesions. Soft tissues are unremarkable. IMPRESSION: Negative. Electronically Signed   By: Donavan Foil M.D.   On: 06/02/2020 19:34        Scheduled Meds: . Chlorhexidine Gluconate Cloth  6 each Topical Daily  . dexamethasone (DECADRON) injection  4 mg Intravenous  Q6H  . dronabinol  5 mg Oral BID AC  . enoxaparin (LOVENOX) injection  100 mg Subcutaneous BID  . [START ON 06/05/2020] fentaNYL  1 patch Transdermal Q72H  . gabapentin  300 mg Oral TID  . insulin aspart  0-15 Units Subcutaneous TID WC  . insulin aspart  0-5 Units Subcutaneous QHS  . insulin aspart  3 Units Subcutaneous TID WC  . insulin glargine  12 Units Subcutaneous QHS  . lipase/protease/amylase  24,000 Units Oral TID AC  . metoprolol tartrate  25 mg Oral Q6H  . sodium chloride flush  10-40 mL Intracatheter Q12H   Continuous Infusions:    LOS: 2 days    Time spent: over 30 min    Fayrene Helper, MD [facility]   To contact the attending provider between 7A-7P or the covering provider during after hours 7P-7A, please log into the web site [email or web address] and access using universal [place] password for that web site. If you do not have the password, please call the hospital operator.  06/04/2020, 2:56 PM

## 2020-06-05 ENCOUNTER — Inpatient Hospital Stay (HOSPITAL_COMMUNITY): Payer: PRIVATE HEALTH INSURANCE

## 2020-06-05 DIAGNOSIS — R29898 Other symptoms and signs involving the musculoskeletal system: Secondary | ICD-10-CM | POA: Diagnosis not present

## 2020-06-05 LAB — COMPREHENSIVE METABOLIC PANEL
ALT: 102 U/L — ABNORMAL HIGH (ref 0–44)
AST: 61 U/L — ABNORMAL HIGH (ref 15–41)
Albumin: 2.3 g/dL — ABNORMAL LOW (ref 3.5–5.0)
Alkaline Phosphatase: 483 U/L — ABNORMAL HIGH (ref 38–126)
Anion gap: 6 (ref 5–15)
BUN: 14 mg/dL (ref 6–20)
CO2: 30 mmol/L (ref 22–32)
Calcium: 7.5 mg/dL — ABNORMAL LOW (ref 8.9–10.3)
Chloride: 97 mmol/L — ABNORMAL LOW (ref 98–111)
Creatinine, Ser: 0.54 mg/dL — ABNORMAL LOW (ref 0.61–1.24)
GFR calc Af Amer: >60 mL/min (ref >60)
GFR calc non Af Amer: >60 mL/min (ref >60)
Glucose, Bld: 435 mg/dL — ABNORMAL HIGH (ref 70–99)
Potassium: 3.8 mmol/L (ref 3.5–5.1)
Sodium: 133 mmol/L — ABNORMAL LOW (ref 135–145)
Total Bilirubin: 1 mg/dL (ref 0.3–1.2)
Total Protein: 4.9 g/dL — ABNORMAL LOW (ref 6.5–8.1)

## 2020-06-05 LAB — GLUCOSE, CAPILLARY
Glucose-Capillary: 428 mg/dL — ABNORMAL HIGH (ref 70–99)
Glucose-Capillary: 474 mg/dL — ABNORMAL HIGH (ref 70–99)
Glucose-Capillary: 335 mg/dL — ABNORMAL HIGH (ref 70–99)
Glucose-Capillary: 231 mg/dL — ABNORMAL HIGH (ref 70–99)

## 2020-06-05 LAB — CBC WITH DIFFERENTIAL/PLATELET
Abs Immature Granulocytes: 0.25 10*3/uL — ABNORMAL HIGH (ref 0.00–0.07)
Basophils Absolute: 0 10*3/uL (ref 0.0–0.1)
Basophils Relative: 0 %
Eosinophils Absolute: 0 10*3/uL (ref 0.0–0.5)
Eosinophils Relative: 0 %
HCT: 34 % — ABNORMAL LOW (ref 39.0–52.0)
Hemoglobin: 10.8 g/dL — ABNORMAL LOW (ref 13.0–17.0)
Immature Granulocytes: 5 %
Lymphocytes Relative: 13 %
Lymphs Abs: 0.6 10*3/uL — ABNORMAL LOW (ref 0.7–4.0)
MCH: 30.3 pg (ref 26.0–34.0)
MCHC: 31.8 g/dL (ref 30.0–36.0)
MCV: 95.5 fL (ref 80.0–100.0)
Monocytes Absolute: 0.3 10*3/uL (ref 0.1–1.0)
Monocytes Relative: 7 %
Neutro Abs: 3.7 10*3/uL (ref 1.7–7.7)
Neutrophils Relative %: 75 %
Platelets: 123 10*3/uL — ABNORMAL LOW (ref 150–400)
RBC: 3.56 MIL/uL — ABNORMAL LOW (ref 4.22–5.81)
RDW: 17.4 % — ABNORMAL HIGH (ref 11.5–15.5)
WBC: 5 10*3/uL (ref 4.0–10.5)
nRBC: 0.8 % — ABNORMAL HIGH (ref 0.0–0.2)

## 2020-06-05 LAB — PHOSPHORUS: Phosphorus: 3.9 mg/dL (ref 2.5–4.6)

## 2020-06-05 LAB — MAGNESIUM: Magnesium: 2 mg/dL (ref 1.7–2.4)

## 2020-06-05 IMAGING — MR MR HEAD W/O CM
10 series · 46 of 48 positions shown · non-contrast
Comparison: None.

CLINICAL DATA: Difficulty ambulating. Bilateral lower extremity
weakness with recurrent falls. History of metastatic pancreatic
cancer.

EXAM:
MRI HEAD WITHOUT CONTRAST
TECHNIQUE: Multiplanar, multiecho pulse sequences of the brain and surrounding
structures were obtained without intravenous contrast.

[Series 5: dwi_tracew · axial · 3.0mm · 0.96mm/px · z∈[+6,+153]mm · 10 of 108 slices shown]
[im 1/108]
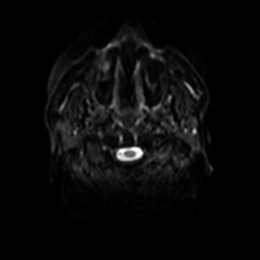
[im 10/108]
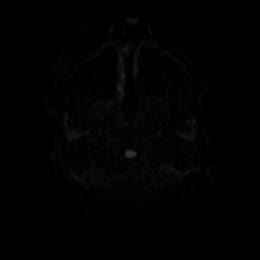
[im 20/108]
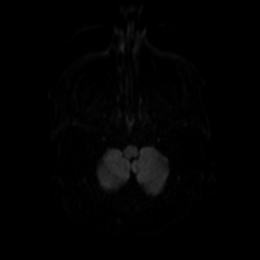
[im 30/108]
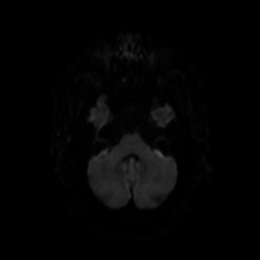
[im 39/108]
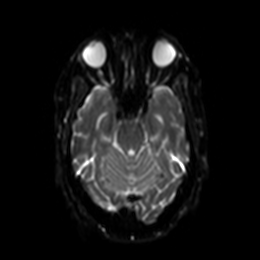
[im 49/108]
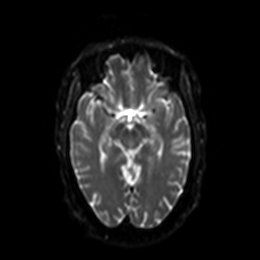
[im 59/108]
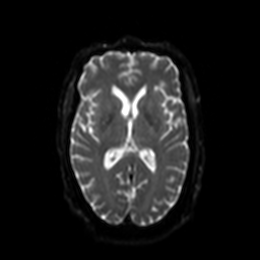
[im 78/108]
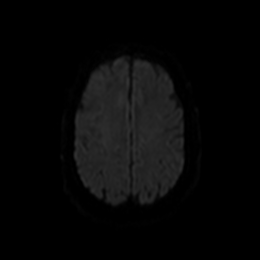
[im 88/108]
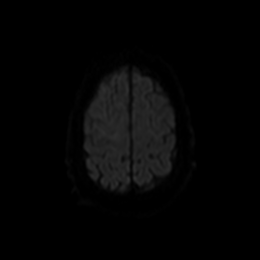
[im 108/108]
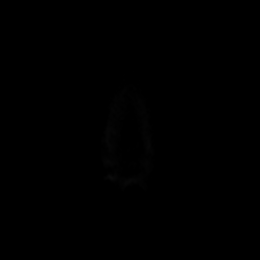

[Series 6: dwi_adc · axial · 3.0mm · 0.96mm/px · z∈[+6,+153]mm · 7 of 53 slices shown]
[im 1/53]
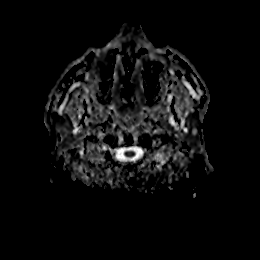
[im 9/53]
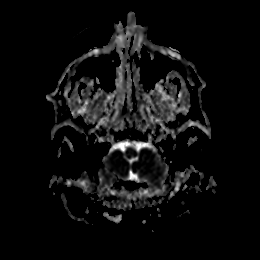
[im 18/53]
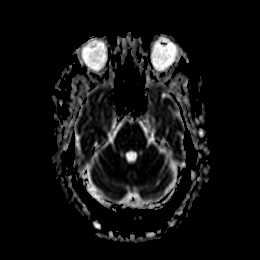
[im 27/53]
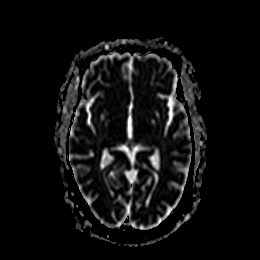
[im 35/53]
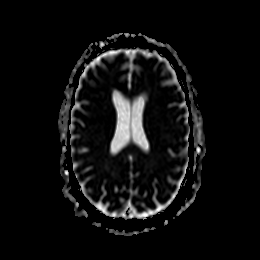
[im 44/53]
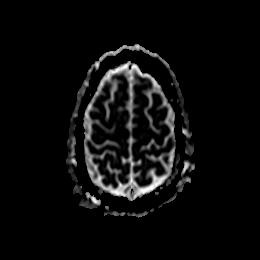
[im 53/53]
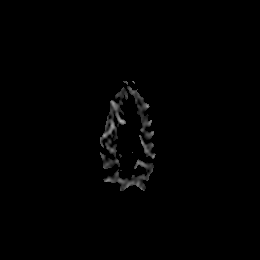

[Series 7: FLAIR · axial · 4.0mm · 0.86mm/px · z∈[-2,+149]mm · 4 of 42 slices shown]
[im 1/42]
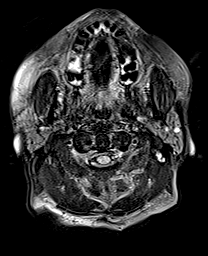
[im 14/42]
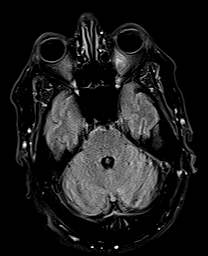
[im 28/42]
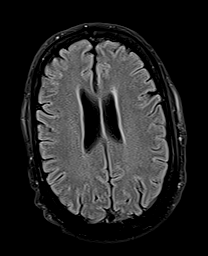
[im 42/42]
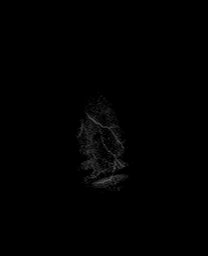

[Series 8: T2 · sagittal · 5.0mm · 0.47mm/px · 2 of 23 slices shown (1 of 3)]
[im 1/23]
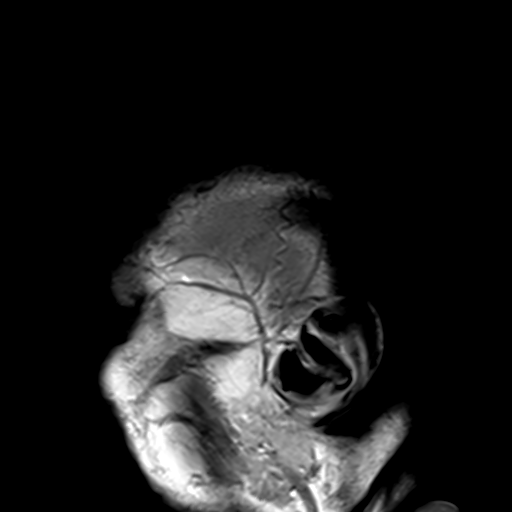
[im 23/23]
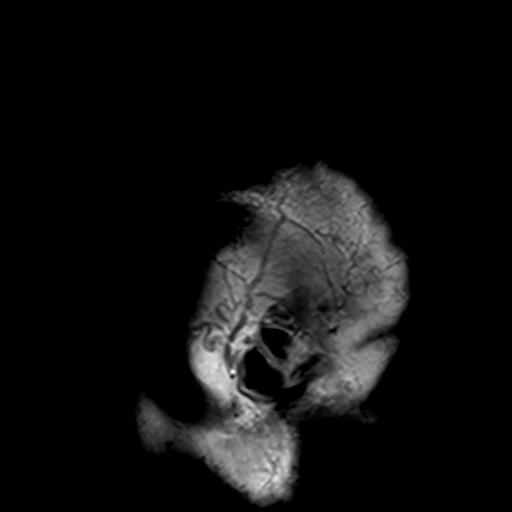

[Series 9: T2 · axial · 5.0mm · 0.45mm/px · z∈[+3,+147]mm · 3 of 25 slices shown (2 of 3)]
[im 1/25]
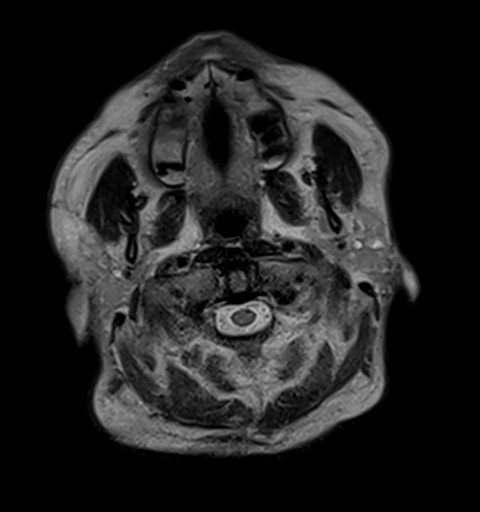
[im 13/25]
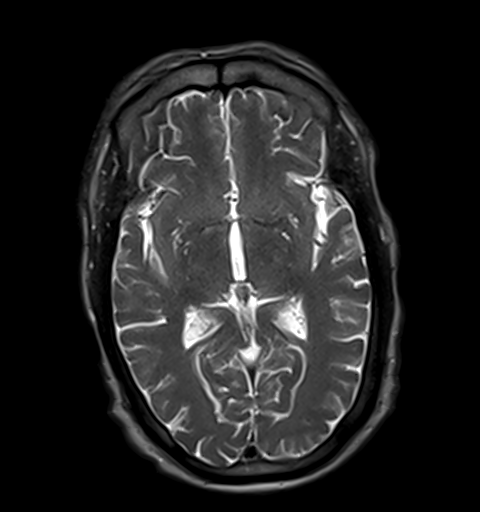
[im 25/25]
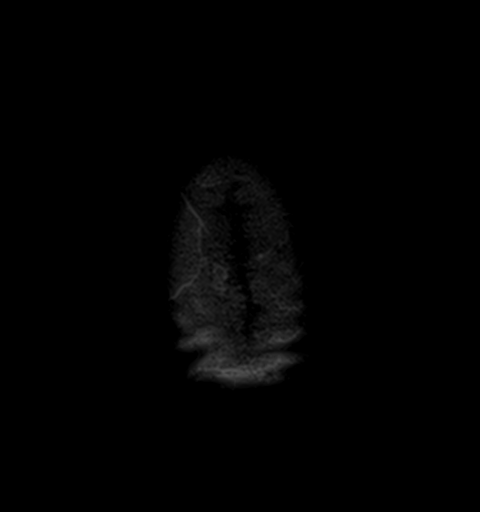

[Series 10: GRE · axial · 4.0mm · 0.45mm/px · z∈[-1,+150]mm · 4 of 42 slices shown]
[im 1/42]
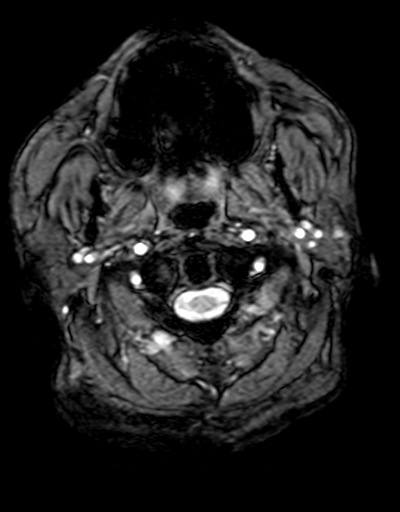
[im 14/42]
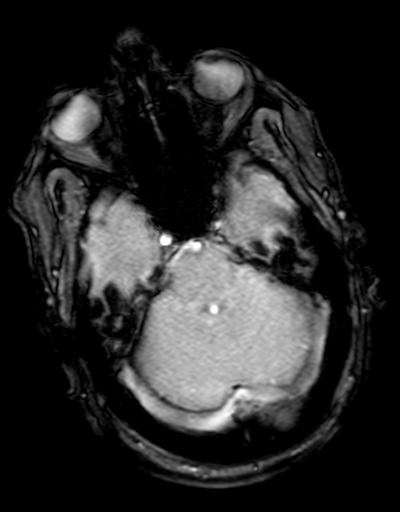
[im 28/42]
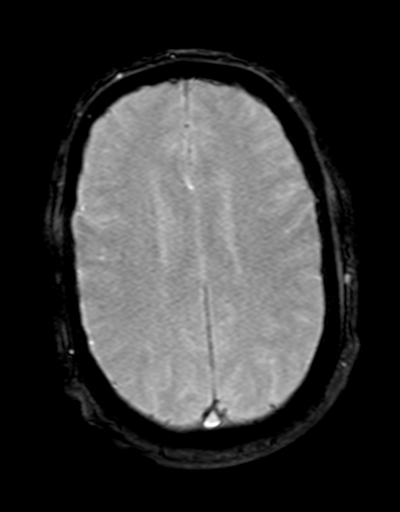
[im 42/42]
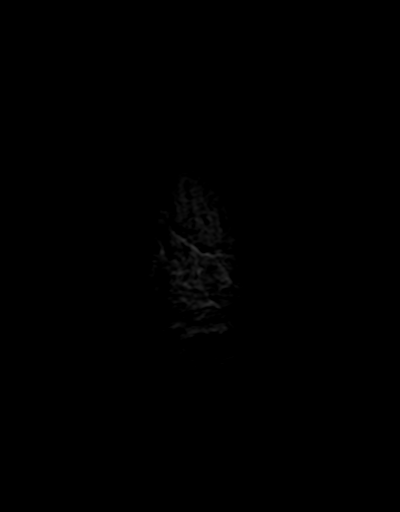

[Series 11: T1 · axial · 4.0mm · 0.45mm/px · z∈[-0,+151]mm · 4 of 42 slices shown]
[im 1/42]
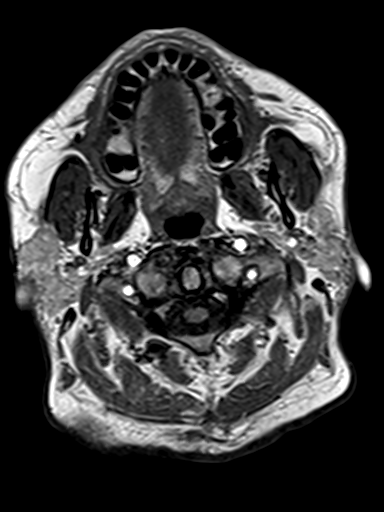
[im 14/42]
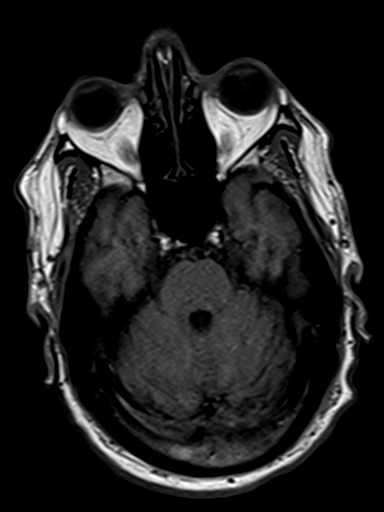
[im 28/42]
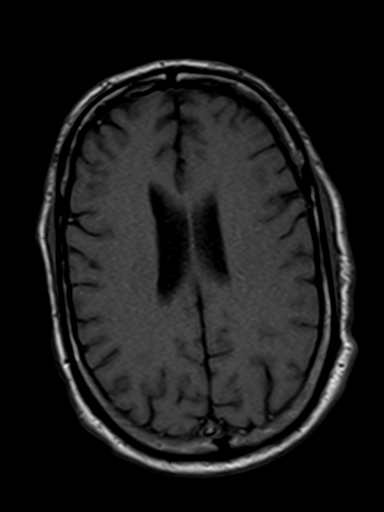
[im 42/42]
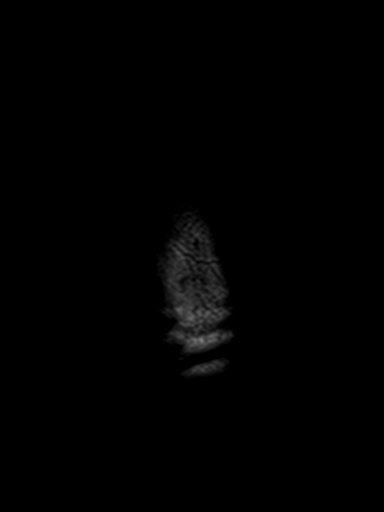

[Series 12: DWI · coronal · 5.0mm · 1.31mm/px · 6 of 60 slices shown (1 of 2)]
[im 1/60]
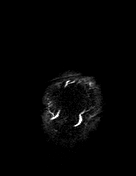
[im 12/60]
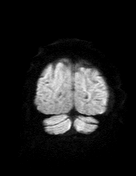
[im 24/60]
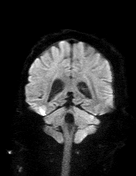
[im 36/60]
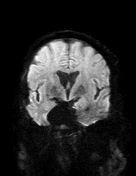
[im 48/60]
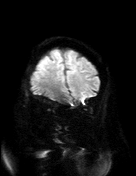
[im 60/60]
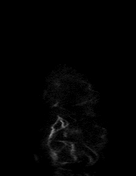

[Series 13: DWI · coronal · 5.0mm · 1.31mm/px · 3 of 30 slices shown (2 of 2)]
[im 1/30]
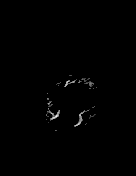
[im 15/30]
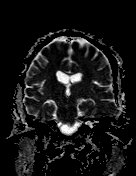
[im 30/30]
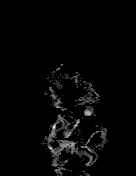

[Series 14: T2 · coronal · 5.0mm · 0.86mm/px · 3 of 29 slices shown (3 of 3)]
[im 1/29]
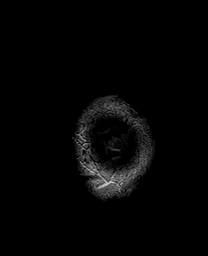
[im 15/29]
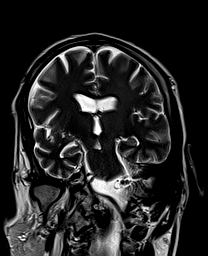
[im 29/29]
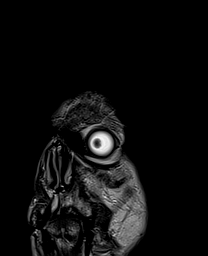

[46 of 48 positions shown; findings below may reference images not displayed]

FINDINGS: The study is mildly motion degraded.

Brain: There is no evidence of acute infarct, intracranial
hemorrhage, mass, midline shift, or extra-axial fluid collection.
The ventricles and sulci are normal. No significant white matter
disease is evident.

Vascular: Major intracranial vascular flow voids are preserved. 6 x
4 mm T2 hypointense focus in the general region of the left MCA
bifurcation (series 9, image 11), suspected to be vascular in origin
and possibly reflecting an aneurysm.

Skull and upper cervical spine: Unremarkable bone marrow signal.

Sinuses/Orbits: Unremarkable orbits. Paranasal sinuses and mastoid
air cells are clear.

Other: None.
IMPRESSION: 1. No acute intracranial abnormality or evidence of metastatic
disease on this unenhanced study.
2. Possible 6 mm left MCA aneurysm. Nonemergent head MRA or CTA is
recommended for further evaluation when the patient's condition
improves and he is better able to remain motionless.

## 2020-06-05 MED ORDER — INSULIN ASPART 100 UNIT/ML ~~LOC~~ SOLN
0.0000 [IU] | SUBCUTANEOUS | Status: DC
Start: 2020-06-05 — End: 2020-06-05

## 2020-06-05 MED ORDER — INSULIN ASPART 100 UNIT/ML ~~LOC~~ SOLN
0.0000 [IU] | Freq: Three times a day (TID) | SUBCUTANEOUS | Status: DC
  Administered 2020-06-05: 20 [IU] via SUBCUTANEOUS
  Administered 2020-06-05: 15 [IU] via SUBCUTANEOUS
  Administered 2020-06-06: 4 [IU] via SUBCUTANEOUS
  Administered 2020-06-06: 7 [IU] via SUBCUTANEOUS

## 2020-06-05 MED ORDER — SENNOSIDES-DOCUSATE SODIUM 8.6-50 MG PO TABS
1.0000 | ORAL_TABLET | Freq: Every day | ORAL | Status: DC
  Administered 2020-06-05: 1 via ORAL
  Filled 2020-06-05: qty 1

## 2020-06-05 MED ORDER — INSULIN ASPART 100 UNIT/ML ~~LOC~~ SOLN
0.0000 [IU] | Freq: Every day | SUBCUTANEOUS | Status: DC
  Administered 2020-06-05: 2 [IU] via SUBCUTANEOUS

## 2020-06-05 MED ORDER — INSULIN GLARGINE 100 UNIT/ML ~~LOC~~ SOLN
18.0000 [IU] | Freq: Every day | SUBCUTANEOUS | Status: DC
  Administered 2020-06-05: 18 [IU] via SUBCUTANEOUS
  Filled 2020-06-05: qty 0.18

## 2020-06-05 MED ORDER — POLYETHYLENE GLYCOL 3350 17 G PO PACK
17.0000 g | PACK | Freq: Every day | ORAL | Status: DC | PRN
  Administered 2020-06-05: 17 g via ORAL
  Filled 2020-06-05: qty 1

## 2020-06-05 MED ORDER — LORAZEPAM 2 MG/ML IJ SOLN
2.0000 mg | Freq: Once | INTRAMUSCULAR | Status: AC | PRN
  Administered 2020-06-05: 2 mg via INTRAVENOUS
  Filled 2020-06-05: qty 1

## 2020-06-05 MED ORDER — BISACODYL 5 MG PO TBEC: 5.0000 mg | DELAYED_RELEASE_TABLET | Freq: Every day | ORAL | Status: DC | PRN

## 2020-06-05 NOTE — Progress Notes (Signed)
PROGRESS NOTE    Melvin [name]  JIR:[id number] DOB: Oct 31, 1961 DOA: 06/02/2020 PCP: Emeterio Reeve, DO   Chief Complaint  Patient presents with  . Blood clot  . Nausea   Brief Narrative:  Melvin [name] is Melvin [name] 59 y.o. male with medical history significant for Pancreatic cancer with metastasis to liver, lung and lymph nodes, recent PE and DVT, type 2 diabetes and hyperlipidemia who presents with recurrent falls and bilateral lower extremity weakness.  Wife at bedside provides most of the history as patient was fatigued and asleep during most of the evaluation.  She reports that for the past several weeks he has continued to have recurrent falls due to bilateral lower extremity weakness.  She had to go out and buy him [employer] walker and even with that he was continuing to have trouble keeping his balance.  He reports his right leg being weaker and also feels numbness to the entire right extremity below the hip.  He fell in the bathtub this past week and has had ankle swelling.  Denies any saddle anesthesia or bowel or bladder incontinence.  Wife also reports that he had Felipa Laroche fever up to 102 two days ago after receiving Londen Bok Lovenox shot but she just gave him 2 Tylenols and he has not had any fever since.  Denies any coughing or runny nose.  He has chronic shortness of breath due to metastasis of cancer to his lungs.  Denies any nausea, vomiting or diarrhea.  His last bowel movement was  2 days ago.  Reportedly patient was started on Xarelto for Kareem Cathey saddle embolus pulmonary embolism found on 04/20/2020.  He was also diagnosed with an extensive right DVT on 6/11 and started on Lovenox last week.  However wife has been doing both Lovenox and Xarelto.  She denies noticing any bleeding.  In the ED, he was afebrile, mildly hypertensive on room air. No leukocytosis.  Has stable anemia.  K of 2.9.  Glucose of 256.  AST elevated 146 and ALT of 174 with alkaline phosphatase of 522 which appears to be chronic for  him. Lactate of 2.7.  CT lumbar spine shows new sclerotic metastatic lesion in the lumbar spine and bony pelvis without any fracture or subluxation.  There is also lumbar spondylosis and DDD causing osseous foraminal narrowing bilaterally at L4-5 and L5-S1 potentially on the left L2-3 and L3-4.  CT thoracic shows widespread sclerotic osseous metastatic disease in the thoracic spine.  Patient not able to tolerate MRI due to anxiety and refused.   Assessment & Plan:   Principal Problem:   Lower extremity weakness Active Problems:   Type 2 diabetes mellitus with diabetic polyneuropathy, without long-term current use of insulin (HCC)   DVT femoral (deep venous thrombosis) with thrombophlebitis, right ([place])   Primary pancreatic cancer with metastasis to other site [facility])   Hypokalemia   SVT (supraventricular tachycardia) (HCC)  Bilateral lower extremity weakness  Concern for Cord Compression in Setting of Metastatic Disease:  CT L and T spine with widespread sclerotic osseous metastatic disease in thoracic spine as well as new sclerotic metastatic lesions in the lumbar spine and bony pelvis  Repeat MRI L/T spine motion degraded, but no gross epidural tumor identified, no thoracic spinal cord compression, cauda equina grossly unremarkable, poorly visualized conus Will try to get MRI brain Steroids d/c'd Appreciate oncology recommendations ->  Looking into possibility of Lumakras.    History of PE/recent DVT Wife was doing both Xarelto and Lovenox.  Will only continue  Lovenox for now since he appeared to have failed Xarelto and developed DVT while on it.  Tachycardia: cardiology c/s, appreciate recs.  Sinus tach with PVC's on EKG.  Cardiology c/s, appreciate recs TSH wnl Echo with EF 02-72%, grade 1 diastolic dysfunction  Metoprolol QID  Type 2 diabetes Basal and bolus insulin Adjust with steroids A1c 04/2020 8.9  Hypokalemia Repleted.  Will recheck in the morning.  Elevated  lactate Resolved  History of metastatic pancreatic cancer Continue Creon, Marinol  Elevated LFTs: likely related to metastatic disease, follow  DVT prophylaxis: lovenox Code Status: full  Family Communication: none at bedside Disposition:   Status is: Inpatient  Remains inpatient appropriate because:Inpatient level of care appropriate due to severity of illness   Dispo: The patient is from: Home              Anticipated d/c is to: Home              Anticipated d/c date is: > 3 days              Patient currently is not medically stable to d/c.   Consultants:   oncology  Procedures:  Echo IMPRESSIONS    1. Left ventricular ejection fraction, by estimation, is 70 to 75%. The  left ventricle has hyperdynamic function. The left ventricle has no  regional wall motion abnormalities. Left ventricular diastolic parameters  are consistent with Grade I diastolic  dysfunction (impaired relaxation).  2. Right ventricular systolic function is hyperdynamic. The right  ventricular size is normal.  3. Left atrial size was mild to moderately dilated.  4. The mitral valve is normal in structure. Trivial mitral valve  regurgitation.  5. The aortic valve is normal in structure. Aortic valve regurgitation is  not visualized.  6. Aortic dilatation noted. There is borderline dilatation of the aortic  root and of the ascending aorta.  7. The inferior vena cava is normal in size with greater than 50%  respiratory variability, suggesting right atrial pressure of 3 mmHg.   Antimicrobials:  Anti-infectives (From admission, onward)   None     Subjective: No new complaints Feels Lyndy Russman little better from admission  Objective: Vitals:   06/05/20 0911 06/05/20 0932 06/05/20 1207 06/05/20 1251  BP: 117/90 (!) 131/98 118/80 134/81  Pulse: 81 80 81 79  Resp:    16  Temp:   97.6 F (36.4 C) 98.2 F (36.8 C)  TempSrc:   Oral Oral  SpO2:  97% 98% 96%  Weight:      Height:         Intake/Output Summary (Last 24 hours) at 06/05/2020 1331 Last data filed at 06/05/2020 0600 Gross per 24 hour  Intake 157.16 ml  Output 1300 ml  Net -1142.84 ml   Filed Weights   06/02/20 1544 06/03/20 0135  Weight: 104.8 kg 107.3 kg    Examination:  General: No acute distress. Cardiovascular: Heart sounds show Nekhi Liwanag regular rate, and rhythm.  Lungs: Clear to auscultation bilaterally  Abdomen: Soft, nontender, nondistended  Neurological: Alert and oriented 3. Moves all extremities 4 . Cranial nerves II through XII grossly intact. Skin: Warm and dry. No rashes or lesions. Extremities: No clubbing or cyanosis. No edema.    Data Reviewed: I have personally reviewed following labs and imaging studies  CBC: Recent Labs  Lab 06/02/20 1047 06/02/20 1047 06/02/20 1612 06/02/20 1701 06/03/20 0200 06/04/20 0303 06/05/20 0340  WBC 4.4  --  3.9*  --  2.1* 4.5 5.0  NEUTROABS 2.9  --  2.4  --   --  3.5 3.7  HGB 10.9*  --  10.9* 10.9* 10.6* 10.8* 10.8*  HCT 36.2*   < > 35.1* 32.0* 33.5* 33.9* 34.0*  MCV 97.8  --  98.6  --  96.0 94.7 95.5  PLT 196  --  197  --  139* 133* 123*   < > = values in this interval not displayed.    Basic Metabolic Panel: Recent Labs  Lab 06/02/20 1047 06/02/20 1047 06/02/20 1612 06/02/20 1701 06/03/20 0200 06/04/20 0303 06/05/20 0340  NA 142   < > 143 144 139 135 133*  K 3.2*   < > 2.9* 2.7* 3.8 3.5 3.8  CL 101   < > 105 100 100 99 97*  CO2 33*  --  30  --  29 29 30   GLUCOSE 397*   < > 256* 229* 302* 276* 435*  BUN 12   < > 12 10 10 10 14   CREATININE 0.60*  --  0.47* 0.40* 0.48* 0.46* 0.54*  CALCIUM 8.4*  --  7.8*  --  7.8* 7.5* 7.5*  MG  --   --   --   --   --  1.5* 2.0  PHOS  --   --   --   --   --  3.5 3.9   < > = values in this interval not displayed.    GFR: Estimated Creatinine Clearance: 134.9 mL/min (Joffre Lucks) (by C-G formula based on SCr of 0.54 mg/dL (L)).  Liver Function Tests: Recent Labs  Lab 06/02/20 1612 06/04/20 0303  06/05/20 0340  AST 146* 82* 61*  ALT 174* 123* 102*  ALKPHOS 522* 513* 483*  BILITOT 1.0 1.0 1.0  PROT 5.7* 5.1* 4.9*  ALBUMIN 2.5* 2.3* 2.3*    CBG: Recent Labs  Lab 06/04/20 1218 06/04/20 1713 06/04/20 2028 06/05/20 0731 06/05/20 1223  GLUCAP 381* 335* 356* 428* 474*     Recent Results (from the past 240 hour(s))  Culture, Urine     Status: Abnormal   Collection Time: 06/02/20  1:49 PM   Specimen: Urine, Clean Catch  Result Value Ref Range Status   Specimen Description   Final    URINE, CLEAN CATCH Performed at [facility] at [facility], [address] [place], [place] [address]    Special Requests   Final    NONE Performed at [facility] at [facility], [address], [place], [place] [address]    Culture (Dura Mccormack)  Final    <10,000 COLONIES/mL INSIGNIFICANT GROWTH Performed at [facility], [place] [address] [place], [place] [address]    Report Status 06/03/2020 FINAL  Final  Blood culture (routine x 2)     Status: None (Preliminary result)   Collection Time: 06/02/20  4:50 PM   Specimen: BLOOD  Result Value Ref Range Status   Specimen Description   Final    BLOOD RIGHT ANTECUBITAL Performed at [facility] [address], [place], [place] [address]    Special Requests   Final    BOTTLES DRAWN AEROBIC AND ANAEROBIC Blood Culture results may not be optimal due to an excessive volume of blood received in culture bottles Performed at [facility] [address], [place], [place] [address]    Culture   Final    NO GROWTH 3 DAYS Performed at [facility], [place]  [address], [place], [place] [address]    Report Status PENDING  Incomplete  SARS Coronavirus 2 by RT PCR (hospital order, performed in [facility] hospital lab) Nasopharyngeal Nasopharyngeal Swab     Status: None   Collection Time: 06/02/20  5:22 PM   Specimen: Nasopharyngeal Swab  Result Value Ref  Range Status   SARS Coronavirus 2 NEGATIVE NEGATIVE Final    Comment: (NOTE) SARS-CoV-2 target nucleic acids are NOT DETECTED.  The SARS-CoV-2 RNA is generally detectable in upper and lower respiratory specimens during the acute phase of infection. The lowest concentration of SARS-CoV-2 viral copies this assay can detect is 250 copies / mL. Donterius Filley negative result does not preclude SARS-CoV-2 infection and should not be used as the sole basis for treatment or other patient management decisions.  Agness Sibrian negative result may occur with improper specimen collection / handling, submission of specimen other than nasopharyngeal swab, presence of viral mutation(s) within the areas targeted by this assay, and inadequate number of viral copies (<250 copies / mL). Josua Ferrebee negative result must be combined with clinical observations, patient history, and epidemiological information.  Fact Sheet for Patients:   StrictlyIdeas.no  Fact Sheet for Healthcare Providers: BankingDealers.co.za  This test is not yet approved or  cleared by the [place] FDA and has been authorized for detection and/or diagnosis of SARS-CoV-2 by FDA under an Emergency Use Authorization (EUA).  This EUA will remain in effect (meaning this test can be used) for the duration of the COVID-19 declaration under Section 564(b)(1) of the Act, 21 U.S.C. section 360bbb-3(b)(1), unless the authorization is terminated or revoked sooner.  Performed at [facility], [place] [address], [place], [place] [address]   Blood culture (routine x 2)     Status: None (Preliminary result)   Collection Time: 06/02/20  6:02 PM   Specimen: BLOOD  Result Value Ref Range Status   Specimen Description   Final    BLOOD LEFT ANTECUBITAL Performed at [facility] [address], [place], [place] [address]    Special Requests   Final    BOTTLES DRAWN AEROBIC AND ANAEROBIC Blood  Culture adequate volume Performed at [facility] [address] [place], [place] [address]    Culture   Final    NO GROWTH 3 DAYS Performed at [facility], [place] [address], [place], [place] [address]    Report Status PENDING  Incomplete         Radiology Studies: MR THORACIC SPINE WO CONTRAST  Result Date: 06/04/2020 CLINICAL DATA:  Back pain and bilateral leg weakness. Metastatic pancreatic cancer. EXAM: MRI THORACIC AND LUMBAR SPINE WITHOUT CONTRAST TECHNIQUE: Multiplanar and multiecho pulse sequences of the thoracic and lumbar spine were obtained without intravenous contrast. COMPARISON:  Thoracic and lumbar spine CT 06/02/2020. Thoracic spine MRI 06/02/2020. FINDINGS: The patient could not tolerate the examination which was terminated prematurely. IV contrast was not administered, and planned postcontrast sequences (including sagittal T2 imaging) were not obtained. The study is motion degraded throughout including severe motion artifact on all axial thoracic spine sequences which are largely nondiagnostic. MRI THORACIC SPINE FINDINGS Alignment:  No listhesis. Vertebrae: As seen on the prior studies, there are multiple sclerotic metastases with extensive involvement of the upper thoracic spine (with the largest lesions involving the T3 and T4 vertebral bodies and T2-T4 posterior elements). No gross epidural tumor is identified although assessment for small volume/noncompressive epidural tumor is limited on this study. Preserved vertebral body heights without  Barnett Elzey gross fracture identified. Cord:  No cord compression. Paraspinal and other soft tissues: Small right and small to moderate left pleural effusions. Disc levels: Widespread bridging vertebral osteophytes in the thoracic spine. MRI LUMBAR SPINE FINDINGS Segmentation:  Standard. Alignment:  Normal. Vertebrae: Large sclerotic lesions in the L1 and L5 vertebral bodies. Additional sclerotic lesions partially visualized  in the sacrum and ilia. No definite epidural tumor. Preserved vertebral body heights without Reyli Schroth fracture identified. Conus medullaris and cauda equina: Conus ends at or above the T12 level and is poorly visualized. Cauda equina is grossly unremarkable. Paraspinal and other soft tissues: Grossly unremarkable. Disc levels: L1-2: Mild disc bulging and facet and ligamentum flavum hypertrophy result in borderline to mild spinal stenosis without significant neural foraminal stenosis. L2-3: Mild disc bulging and mild facet and ligamentum flavum hypertrophy without evidence of significant stenosis. L3-4: Mild disc bulging and mild facet and ligamentum flavum hypertrophy result in mild spinal stenosis, mild bilateral lateral recess stenosis, and mild left neural foraminal stenosis. L4-5: Minimal disc bulging and moderate facet hypertrophy result in mild left neural foraminal stenosis without significant spinal stenosis. L5-S1: Asymmetric severe right facet arthrosis result in mild right neural foraminal stenosis without spinal stenosis. IMPRESSION: 1. Incomplete, motion degraded examination including largely nondiagnostic axial thoracic spine imaging. No IV contrast administered. 2. Widespread osseous metastases including extensive involvement of the upper thoracic spine. No gross epidural tumor identified. 3. No thoracic spinal cord compression. 4. Lumbar spondylosis and facet arthrosis with mild spinal stenosis at L3-4. Electronically Signed   By: Logan Bores M.D.   On: 06/04/2020 21:42   MR LUMBAR SPINE WO CONTRAST  Result Date: 06/04/2020 CLINICAL DATA:  Back pain and bilateral leg weakness. Metastatic pancreatic cancer. EXAM: MRI THORACIC AND LUMBAR SPINE WITHOUT CONTRAST TECHNIQUE: Multiplanar and multiecho pulse sequences of the thoracic and lumbar spine were obtained without intravenous contrast. COMPARISON:  Thoracic and lumbar spine CT 06/02/2020. Thoracic spine MRI 06/02/2020. FINDINGS: The patient could not  tolerate the examination which was terminated prematurely. IV contrast was not administered, and planned postcontrast sequences (including sagittal T2 imaging) were not obtained. The study is motion degraded throughout including severe motion artifact on all axial thoracic spine sequences which are largely nondiagnostic. MRI THORACIC SPINE FINDINGS Alignment:  No listhesis. Vertebrae: As seen on the prior studies, there are multiple sclerotic metastases with extensive involvement of the upper thoracic spine (with the largest lesions involving the T3 and T4 vertebral bodies and T2-T4 posterior elements). No gross epidural tumor is identified although assessment for small volume/noncompressive epidural tumor is limited on this study. Preserved vertebral body heights without Arlyce Circle gross fracture identified. Cord:  No cord compression. Paraspinal and other soft tissues: Small right and small to moderate left pleural effusions. Disc levels: Widespread bridging vertebral osteophytes in the thoracic spine. MRI LUMBAR SPINE FINDINGS Segmentation:  Standard. Alignment:  Normal. Vertebrae: Large sclerotic lesions in the L1 and L5 vertebral bodies. Additional sclerotic lesions partially visualized in the sacrum and ilia. No definite epidural tumor. Preserved vertebral body heights without Duanne Duchesne fracture identified. Conus medullaris and cauda equina: Conus ends at or above the T12 level and is poorly visualized. Cauda equina is grossly unremarkable. Paraspinal and other soft tissues: Grossly unremarkable. Disc levels: L1-2: Mild disc bulging and facet and ligamentum flavum hypertrophy result in borderline to mild spinal stenosis without significant neural foraminal stenosis. L2-3: Mild disc bulging and mild facet and ligamentum flavum hypertrophy without evidence of significant stenosis. L3-4: Mild disc bulging and mild  facet and ligamentum flavum hypertrophy result in mild spinal stenosis, mild bilateral lateral recess stenosis, and  mild left neural foraminal stenosis. L4-5: Minimal disc bulging and moderate facet hypertrophy result in mild left neural foraminal stenosis without significant spinal stenosis. L5-S1: Asymmetric severe right facet arthrosis result in mild right neural foraminal stenosis without spinal stenosis. IMPRESSION: 1. Incomplete, motion degraded examination including largely nondiagnostic axial thoracic spine imaging. No IV contrast administered. 2. Widespread osseous metastases including extensive involvement of the upper thoracic spine. No gross epidural tumor identified. 3. No thoracic spinal cord compression. 4. Lumbar spondylosis and facet arthrosis with mild spinal stenosis at L3-4. Electronically Signed   By: Logan Bores M.D.   On: 06/04/2020 21:42        Scheduled Meds: . Chlorhexidine Gluconate Cloth  6 each Topical Daily  . dronabinol  5 mg Oral BID AC  . enoxaparin (LOVENOX) injection  100 mg Subcutaneous BID  . fentaNYL  1 patch Transdermal Q72H  . gabapentin  300 mg Oral TID  . insulin aspart  0-20 Units Subcutaneous TID WC  . insulin aspart  0-5 Units Subcutaneous QHS  . insulin aspart  5 Units Subcutaneous TID WC  . insulin glargine  12 Units Subcutaneous QHS  . lipase/protease/amylase  24,000 Units Oral TID AC  . metoprolol tartrate  25 mg Oral Q6H  . sodium chloride flush  10-40 mL Intracatheter Q12H   Continuous Infusions: . sodium chloride 250 mL (06/04/20 1558)     LOS: 3 days    Time spent: over 30 min    Fayrene Helper, MD [facility]   To contact the attending provider between 7A-7P or the covering provider during after hours 7P-7A, please log into the web site [email or web address] and access using universal [place] password for that web site. If you do not have the password, please call the hospital operator.  06/05/2020, 1:31 PM

## 2020-06-05 NOTE — Progress Notes (Signed)
Overall, it looks like Mr. Lanzo might be getting a little bit stronger.  He did have the MRI of the lumbar spine.  He has a lot of arthritis.  He has some issues at L5-S1.  There is no epidural tumor.  He has a couple sclerotic lesions.  There is no fracture.  He is still quite emotional over his situation.  He still gets very weak when he stands up.  Hopefully, he will be able to take physical therapy and said they may be able to help his strength and his mobility.  He is eating okay.  I think he had a couple [employer] yesterday which he enjoyed.  His blood sugars are on the high side.  He is on steroids.  I really not too worried about the blood sugars.  I probably would stop the steroids since there is no evidence of cord involvement.  Surprisingly, the prealbumin is better than I thought it would be.  His prealbumin is 15.3.  I told him that any further chemotherapy would not be effective.  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  I am trying to see about obtaining the newly approved FDA agent- sotorabin - Lumakras.  I am not sure if we will be able to get this as this agent is only approved for metastatic lung cancer.  Hopefully, physical therapy will be able to work with him.  I will see any reason that physical therapy cannot help him out.  There is no cord compression.  There is no fracture in his back.  Again I know he is very emotional over this.  It is a lot handle with respect to his overall prognosis and our relative inability to try to treat him.  Again, the K-RAS mutation I think really is causing problems for [place] with respect to allowing chemotherapy to work.  I know that the staff on [address] are doing a tremendous job with him.  I know that he appreciates their compassion.  Lattie Haw, MD  Psalm 33:4

## 2020-06-05 NOTE — Evaluation (Addendum)
Physical Therapy Evaluation Patient Details Name: Melvin [name] MRN: [id number] DOB: 1961-11-12 Today's Date: 06/05/2020   History of Present Illness  Melvin [name] is a 59 y.o. male with medical history significant for Pancreatic cancer with metastasis to liver, lung and lymph nodes, recent PE and DVT, type 2 diabetes and hyperlipidemia who presents with recurrent falls and bilateral lower extremity weakness.MRI -Widespread osseous metastases including extensive involvement ofthe upper thoracic spine. No gross epidural tumor identifiedT L and T spine with widespread sclerotic osseous metastatic disease in thoracic spine as well as new sclerotic metastatic lesions in the lumbar spine and bony pelvis  Clinical Impression  The patient presents with LE weakness, noted foot drop on right. Ambulated 6 ' using Rw and very close mod assist(H/O knees buckling). Patient endorses decreased sensation in right > left foot. The patient has 24 hour caregivers and DME. Patient may benefit from [place].Pt admitted with above diagnosis. Pt currently with functional limitations due to the deficits listed below (see PT Problem List). Pt will benefit from skilled PT to increase their independence and safety with mobility to allow discharge to the venue listed below.   BP supine-117/90, HR 81, post OOB 131/98 HR 80. SPO2 97%.  Follow Up Recommendations Home health PT;Supervision/Assistance - 24 hour    Equipment Recommendations  None recommended by PT    Recommendations for Other Services   OT    Precautions / Restrictions Precautions Precautions: Fall Precaution Comments: legs  subject to buckle      Mobility  Bed Mobility Overal bed mobility: Needs Assistance Bed Mobility: Supine to Sit     Supine to sit: Mod assist;HOB elevated     General bed mobility comments: extra time to  move the legs to edge, rolled to side and required mod assist with trunk, even with the HOB raised maximally. Extra time to scoot  to edge.  Transfers Overall transfer level: Needs assistance Equipment used: Rolling walker (2 wheeled) Transfers: Sit to/from [facility] Sit to Stand: Mod assist;From elevated surface Stand pivot transfers: Mod assist       General transfer comment: extra time and steady mod assist to rise from bed raised  x 2 at RW.  Stood to urinate. Steady assist to take  small steps to recliner form bed using  RW.Mod assist to rise from recliner x 2 using 1 hand on recliner .  Ambulation/Gait Ambulation/Gait assistance: Mod assist Gait Distance (Feet): 6 Feet Assistive device: Rolling walker (2 wheeled) Gait Pattern/deviations: Step-to pattern;Steppage;Trunk flexed;Decreased step length - left;Decreased stance time - right Gait velocity: decr   General Gait Details: Stand and step in place x 10 x 2 with rest break, noted SOB3/4. SPO2 95%. Then ambulated forward and backward. NOTED RIGHT FOOT DROP.  Stairs            Wheelchair Mobility    Modified Rankin (Stroke Patients Only)       Balance Overall balance assessment: Needs assistance;History of Falls Sitting-balance support: No upper extremity supported;Feet supported Sitting balance-Leahy Scale: Fair     Standing balance support: Bilateral upper extremity supported;During functional activity Standing balance-Leahy Scale: Poor Standing balance comment: reliant on support                             Pertinent Vitals/Pain Pain Assessment: No/denies pain    Home Living Family/patient expects to be discharged to:: Private residence Living Arrangements: Spouse/significant other;Children Available Help at Discharge: Family;Available 24  hours/day Type of Home: House Home Access: Stairs to enter Entrance Stairs-Rails: Psychiatric nurse of Steps: 3 Home Layout: One level Home Equipment: Walker - 2 wheels;Bedside commode;Shower seat;Wheelchair - manual      Prior Function Level of  Independence: Needs assistance   Gait / Transfers Assistance Needed: has been using RW with assistance  ADL's / Homemaking Assistance Needed: spouse assists, has shower seat        Hand Dominance   Dominant Hand: Left    Extremity/Trunk Assessment   Upper Extremity Assessment Upper Extremity Assessment: Overall WFL for tasks assessed    Lower Extremity Assessment Lower Extremity Assessment: RLE deficits/detail;LLE deficits/detail RLE Deficits / Details: dorsiflex 2+, great toe ext 1+. Knee ext 4, hip flex 3+, impaired LT mid foot and distal dorsal and plantar LLE Deficits / Details: dorsiflex4, knee ext 4/ hip flexion 3+, decreased LT midfoot and distal lateral and dorsal    Cervical / Trunk Assessment Cervical / Trunk Assessment: Normal  Communication   Communication: No difficulties  Cognition Arousal/Alertness: Awake/alert Behavior During Therapy: WFL for tasks assessed/performed;Restless;Impulsive Overall Cognitive Status: Difficult to assess                                 General Comments: patient defers to wife to provide details, generally follows and is oriented. at times emotional      General Comments      Exercises General Exercises - Lower Extremity Ankle Circles/Pumps: AROM;Both;10 reps;Seated Long Arc Quad: AROM;10 reps;Both;Seated   Assessment/Plan    PT Assessment Patient needs continued PT services  PT Problem List Decreased strength;Decreased balance;Decreased knowledge of precautions;Decreased mobility;Decreased knowledge of use of DME;Decreased activity tolerance;Decreased safety awareness;Impaired sensation       PT Treatment Interventions DME instruction;Therapeutic activities;Gait training;Therapeutic exercise;Patient/family education;Stair training;Functional mobility training    PT Goals (Current goals can be found in the Care Plan section)  Acute Rehab PT Goals Patient Stated Goal: to walk, go home PT Goal Formulation:  With patient/family Time For Goal Achievement: 06/19/20 Potential to Achieve Goals: Fair    Frequency Min 3X/week   Barriers to discharge        Co-evaluation               AM-PAC PT "6 Clicks" Mobility  Outcome Measure Help needed turning from your back to your side while in a flat bed without using bedrails?: A Lot Help needed moving from lying on your back to sitting on the side of a flat bed without using bedrails?: A Lot Help needed moving to and from a bed to a chair (including a wheelchair)?: A Lot Help needed standing up from a chair using your arms (e.g., wheelchair or bedside chair)?: A Lot Help needed to walk in hospital room?: Total Help needed climbing 3-5 steps with a railing? : Total 6 Click Score: 10    End of Session Equipment Utilized During Treatment: Gait belt Activity Tolerance: Patient tolerated treatment well Patient left: in chair;with call bell/phone within reach;with chair alarm set;with family/visitor present Nurse Communication: Mobility status PT Visit Diagnosis: Muscle weakness (generalized) (M62.81);Unsteadiness on feet (R26.81);Repeated falls (R29.6)    Time: 6073-7106 PT Time Calculation (min) (ACUTE ONLY): 42 min   Charges:   PT Evaluation $PT Eval Moderate Complexity: 1 Mod PT Treatments $Therapeutic Activity: 23-37 mins        Tresa Endo PT Acute Rehabilitation Services Pager [phone] Office [phone]   [place],  Shella Maxim 06/05/2020, 1:59 PM

## 2020-06-06 ENCOUNTER — Inpatient Hospital Stay (HOSPITAL_COMMUNITY): Payer: PRIVATE HEALTH INSURANCE

## 2020-06-06 ENCOUNTER — Encounter (HOSPITAL_COMMUNITY): Payer: Self-pay | Admitting: Family Medicine

## 2020-06-06 DIAGNOSIS — R29898 Other symptoms and signs involving the musculoskeletal system: Secondary | ICD-10-CM | POA: Diagnosis not present

## 2020-06-06 LAB — CBC WITH DIFFERENTIAL/PLATELET
Abs Immature Granulocytes: 0.3 10*3/uL — ABNORMAL HIGH (ref 0.00–0.07)
Basophils Absolute: 0 10*3/uL (ref 0.0–0.1)
Basophils Relative: 0 %
Eosinophils Absolute: 0.1 10*3/uL (ref 0.0–0.5)
Eosinophils Relative: 1 %
HCT: 33.1 % — ABNORMAL LOW (ref 39.0–52.0)
Hemoglobin: 10.5 g/dL — ABNORMAL LOW (ref 13.0–17.0)
Immature Granulocytes: 5 %
Lymphocytes Relative: 25 %
Lymphs Abs: 1.7 10*3/uL (ref 0.7–4.0)
MCH: 30.9 pg (ref 26.0–34.0)
MCHC: 31.7 g/dL (ref 30.0–36.0)
MCV: 97.4 fL (ref 80.0–100.0)
Monocytes Absolute: 0.6 10*3/uL (ref 0.1–1.0)
Monocytes Relative: 10 %
Neutro Abs: 4 10*3/uL (ref 1.7–7.7)
Neutrophils Relative %: 59 %
Platelets: 119 10*3/uL — ABNORMAL LOW (ref 150–400)
RBC: 3.4 MIL/uL — ABNORMAL LOW (ref 4.22–5.81)
RDW: 17.8 % — ABNORMAL HIGH (ref 11.5–15.5)
WBC: 6.7 10*3/uL (ref 4.0–10.5)
nRBC: 1.3 % — ABNORMAL HIGH (ref 0.0–0.2)

## 2020-06-06 LAB — COMPREHENSIVE METABOLIC PANEL
ALT: 115 U/L — ABNORMAL HIGH (ref 0–44)
AST: 105 U/L — ABNORMAL HIGH (ref 15–41)
Albumin: 2.2 g/dL — ABNORMAL LOW (ref 3.5–5.0)
Alkaline Phosphatase: 507 U/L — ABNORMAL HIGH (ref 38–126)
Anion gap: 8 (ref 5–15)
BUN: 16 mg/dL (ref 6–20)
CO2: 32 mmol/L (ref 22–32)
Calcium: 7.9 mg/dL — ABNORMAL LOW (ref 8.9–10.3)
Chloride: 100 mmol/L (ref 98–111)
Creatinine, Ser: 0.41 mg/dL — ABNORMAL LOW (ref 0.61–1.24)
GFR calc Af Amer: >60 mL/min (ref >60)
GFR calc non Af Amer: >60 mL/min (ref >60)
Glucose, Bld: 199 mg/dL — ABNORMAL HIGH (ref 70–99)
Potassium: 3.3 mmol/L — ABNORMAL LOW (ref 3.5–5.1)
Sodium: 140 mmol/L (ref 135–145)
Total Bilirubin: 0.9 mg/dL (ref 0.3–1.2)
Total Protein: 4.8 g/dL — ABNORMAL LOW (ref 6.5–8.1)

## 2020-06-06 LAB — PHOSPHORUS: Phosphorus: 3.8 mg/dL (ref 2.5–4.6)

## 2020-06-06 LAB — GLUCOSE, CAPILLARY
Glucose-Capillary: 121 mg/dL — ABNORMAL HIGH (ref 70–99)
Glucose-Capillary: 196 mg/dL — ABNORMAL HIGH (ref 70–99)
Glucose-Capillary: 218 mg/dL — ABNORMAL HIGH (ref 70–99)
Glucose-Capillary: 222 mg/dL — ABNORMAL HIGH (ref 70–99)

## 2020-06-06 LAB — MAGNESIUM: Magnesium: 1.9 mg/dL (ref 1.7–2.4)

## 2020-06-06 IMAGING — CT CT ANGIO HEAD
3 of 10 series · 16 of 47 positions shown · IV contrast (OMNIPAQUE)
Comparison: MR head without contrast [DATE]

CLINICAL DATA: Recurrent falls. Bilateral lower extremity weakness.
Abnormal MRI of the head.

EXAM:
CT ANGIOGRAPHY HEAD
TECHNIQUE: Multidetector CT imaging of the head was performed using the
standard protocol during bolus administration of intravenous
contrast. Multiplanar CT image reconstructions and MIPs were
obtained to evaluate the vascular anatomy.
CONTRAST:  80mL OMNIPAQUE IOHEXOL 350 MG/ML SOLN

[Series 12: ax thin · axial · 0.39mm/px · z∈[-125,+2]mm · 10 of 147 slices shown]
[im 10/147  brain]
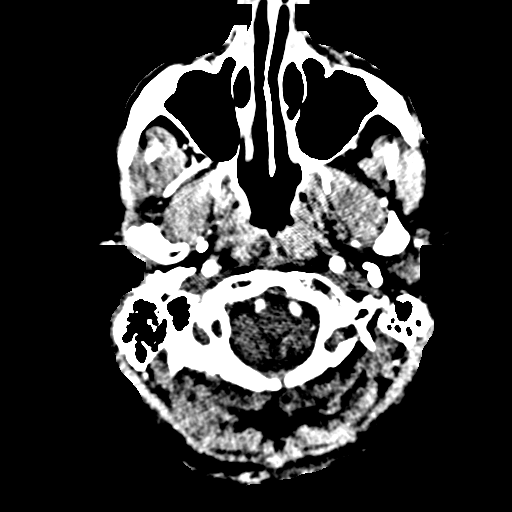
[im 30/147  bone]
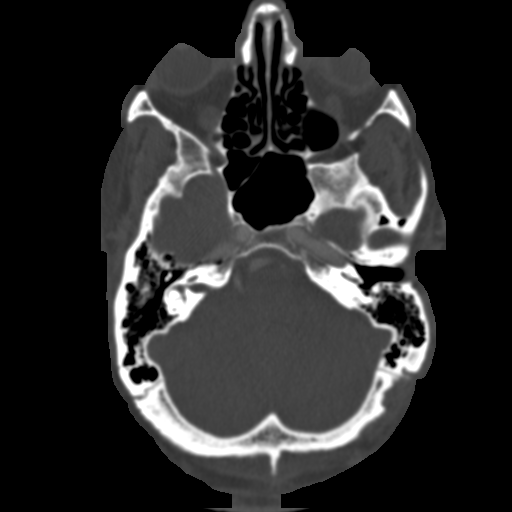
[im 39/147  brain]
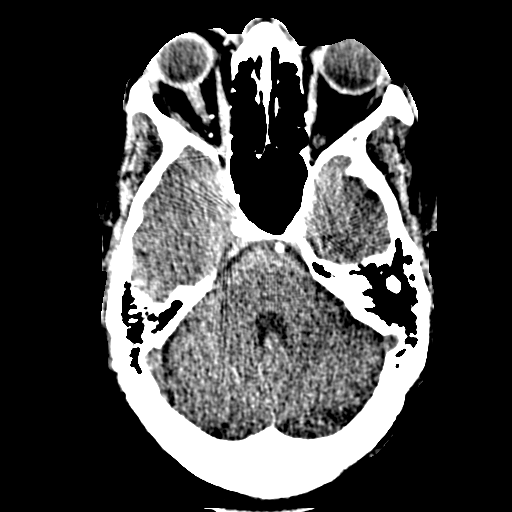
[im 49/147  bone]
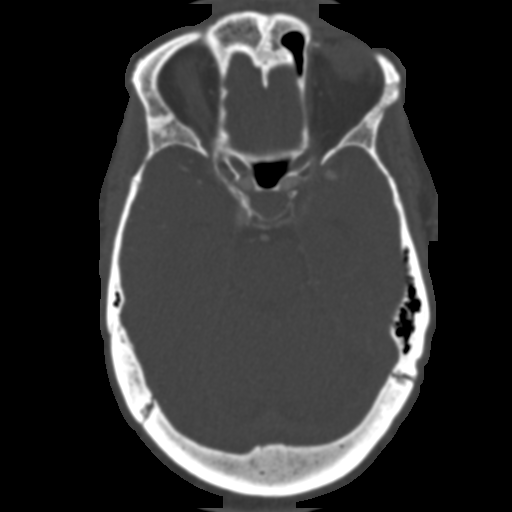
[im 69/147  brain]
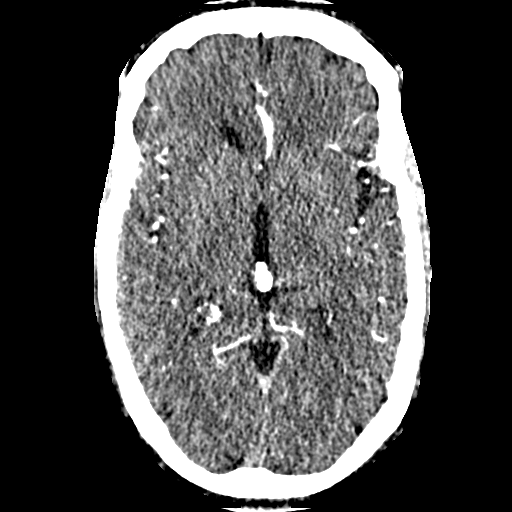
[im 78/147  bone]
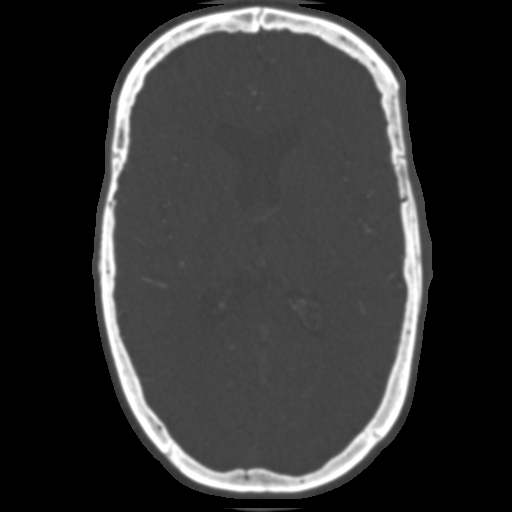
[im 98/147  brain]
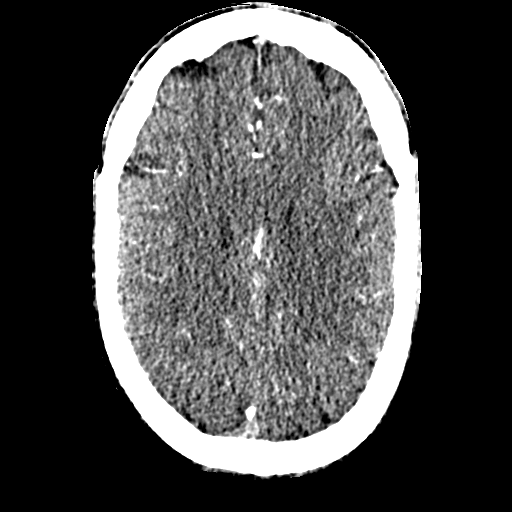
[im 108/147  bone]
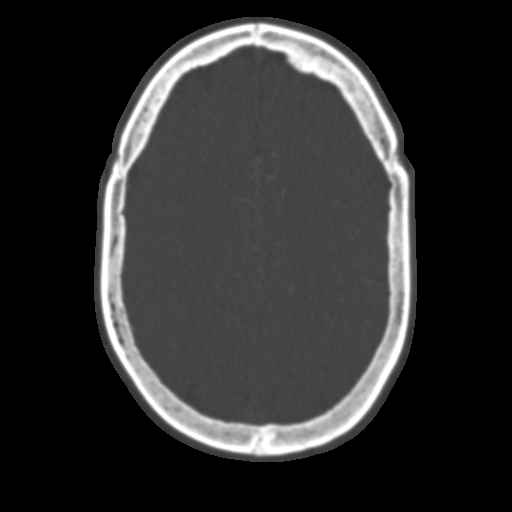
[im 117/147  brain]
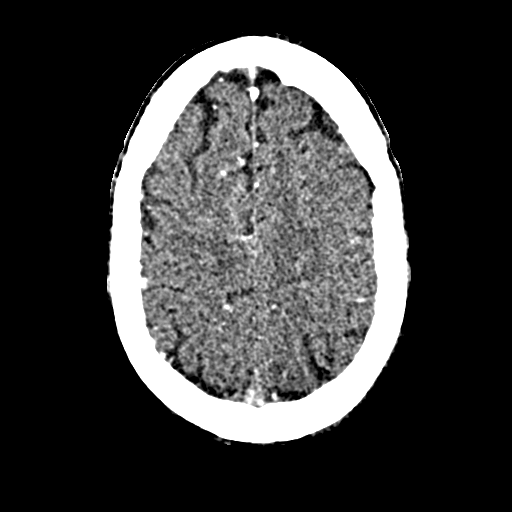
[im 137/147  bone]
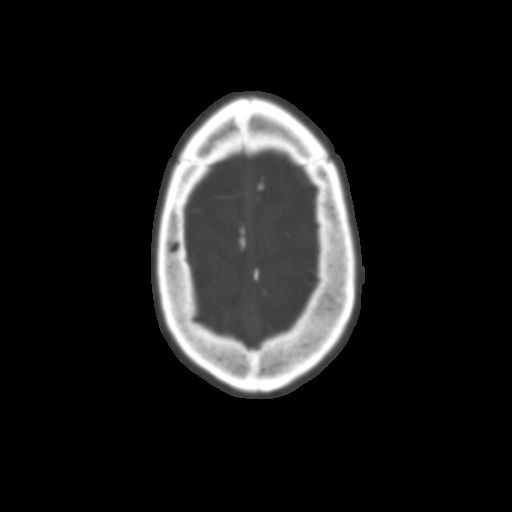

[Series 14: cor thin · coronal · 0.31mm/px · 3 of 201 slices shown]
[im 41/201  brain]
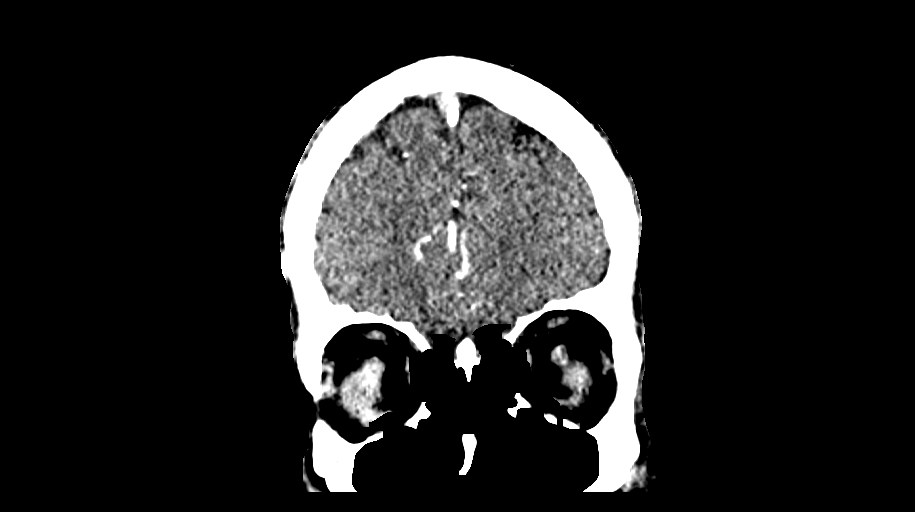
[im 81/201  brain]
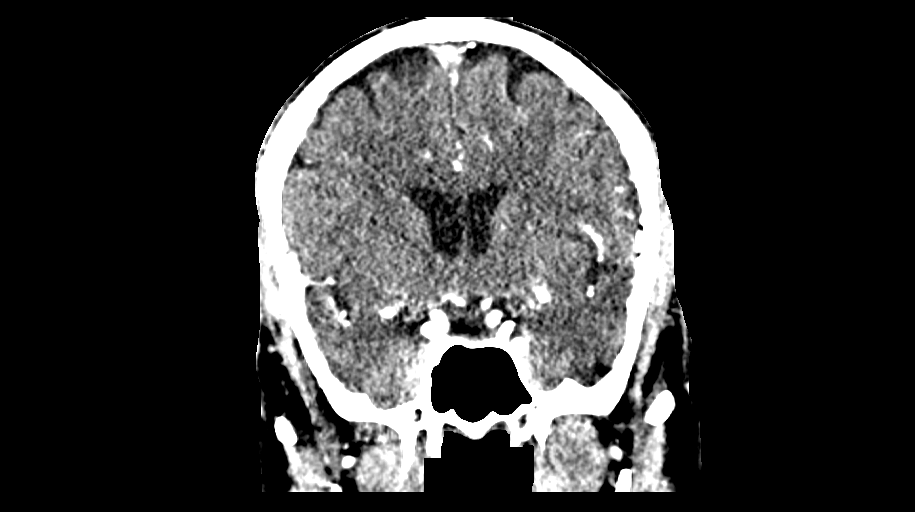
[im 121/201  brain]
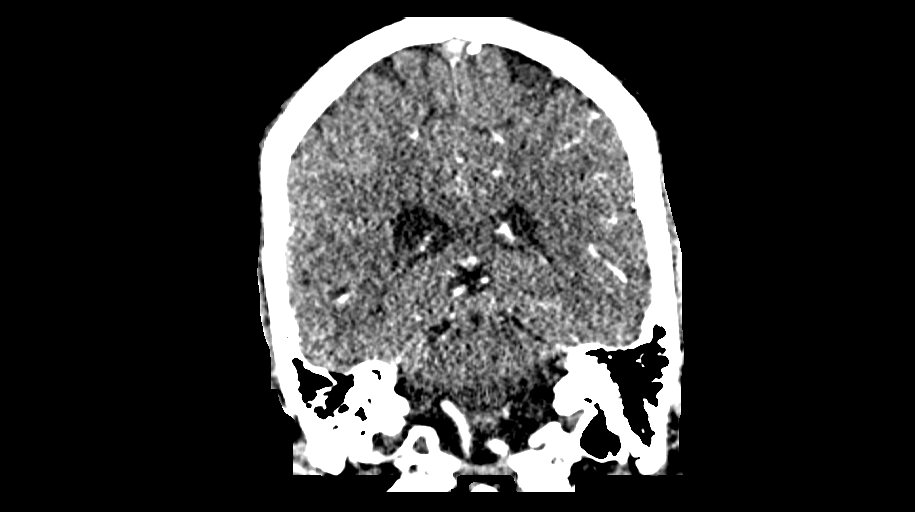

[Series 16: sag thin · sagittal · 0.48mm/px · 3 of 159 slices shown]
[im 40/159  brain]
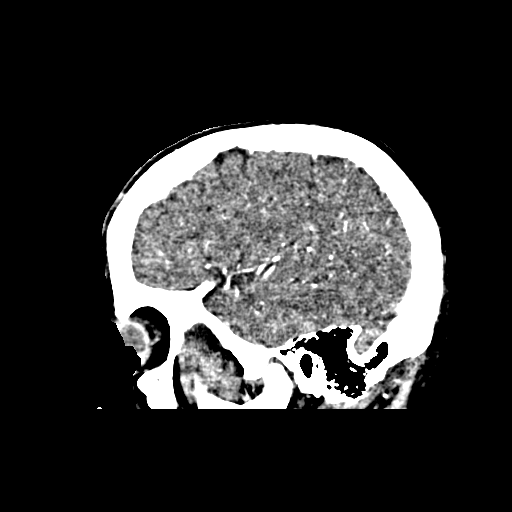
[im 80/159  brain]
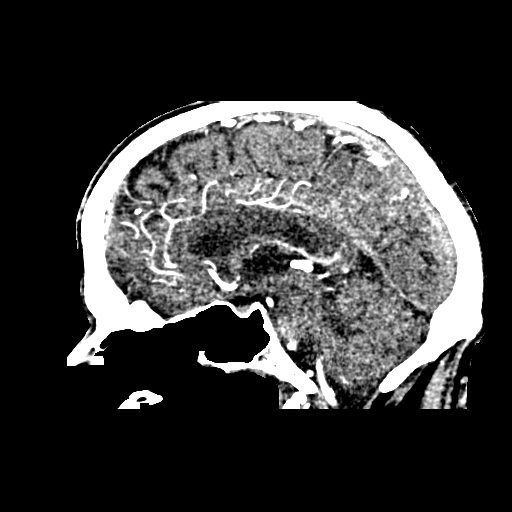
[im 119/159  brain]
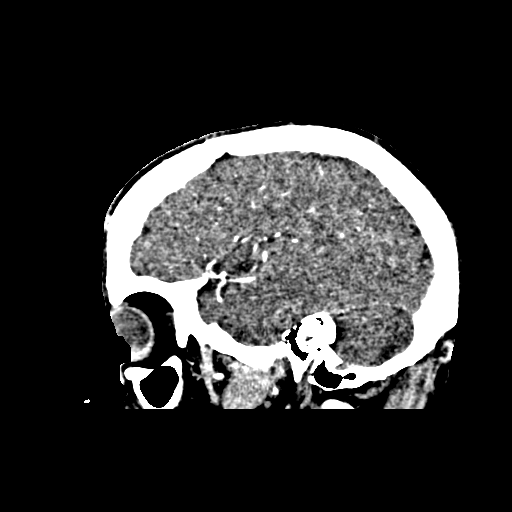

[16 of 47 positions shown; findings below may reference images not displayed]

FINDINGS: CT HEAD

Brain: No acute infarct, hemorrhage, or mass lesion is present. The
ventricles are of normal size. No significant extraaxial fluid
collection is present.

The brainstem and cerebellum are within normal limits.

Vascular: No hyperdense vessel or unexpected calcification.

Skull: Calvarium is intact. No focal lytic or blastic lesions are
present. No significant extracranial soft tissue lesion is present.

Sinuses: The paranasal sinuses and mastoid air cells are clear.

Orbits: The globes and orbits are within normal limits.

CTA HEAD

Anterior circulation: The internal carotid arteries are within
normal limits from the high cervical segments through the ICA
termini bilaterally. The A1 and M1 segments are within normal
limits. The right A1 and M1 segments are normal. The left A1 segment
is normal. Moderate narrowing is present in the distal left M1
segment with a poststenotic fusiform aneurysm measuring 6 x 6 x 4 mm
just proximal to the bifurcation. The MCA and ACA branch vessels are
within normal limits otherwise.

Posterior circulation: Vertebral arteries are codominant. PICA
origins are visualized and normal. Vertebrobasilar junction is
normal. The basilar artery is normal. The right posterior cerebral
artery originates from basilar tip. The left posterior cerebral
artery is of fetal type with a small left P1 segment contributing.
PCA branch vessels are within normal limits bilaterally.

Venous sinuses: The dural sinuses are patent. Straight sinus deep
cerebral veins are intact. Cortical veins are unremarkable. No
vascular malformation is evident.

Anatomic variants: Fetal type left posterior cerebral artery.
IMPRESSION: 1. Moderate narrowing of the distal left M1 segment with a
poststenotic fusiform aneurysm measuring 6 x 6 x 4 mm just proximal
to the bifurcation.
2. No other significant proximal stenosis, aneurysm, or branch
vessel occlusion within the Circle of Willis.
3. Normal CT appearance of the brain.

## 2020-06-06 MED ORDER — HEPARIN SOD (PORK) LOCK FLUSH 100 UNIT/ML IV SOLN
500.0000 [IU] | INTRAVENOUS | Status: AC | PRN
  Administered 2020-06-06: 500 [IU]
  Filled 2020-06-06: qty 5

## 2020-06-06 MED ORDER — SODIUM CHLORIDE (PF) 0.9 % IJ SOLN
INTRAMUSCULAR | Status: AC
  Filled 2020-06-06: qty 50

## 2020-06-06 MED ORDER — METOPROLOL TARTRATE 25 MG PO TABS: 50.0000 mg | ORAL_TABLET | Freq: Two times a day (BID) | ORAL | 0 refills | Status: DC

## 2020-06-06 MED ORDER — IOHEXOL 350 MG/ML SOLN
80.0000 mL | Freq: Once | INTRAVENOUS | Status: AC | PRN
  Administered 2020-06-06: 80 mL via INTRAVENOUS

## 2020-06-06 NOTE — Discharge Summary (Signed)
Physician Discharge Summary  Carvell Hoeffner ACZ:[id number] DOB: 22-Jul-1961 DOA: 06/02/2020  PCP: Emeterio Reeve, DO  Admit date: 06/02/2020 Discharge date: 06/06/2020  Time spent: 40 minutes  Recommendations for Outpatient Follow-up:  1. Follow outpatient CBC/CMP 2. Follow with Dr. Marin Olp outpatient to continue to discuss goals of care, possible treatment with lumakras 3. Poststenotic fusiform aneurysm 6x6x4 mm -> could follow up with IR per neurology, but given pt's poor overall prognosis, would follow up to discuss with Dr. Marin Olp based on his prognosis and goals of care  Discharge Diagnoses:  Principal Problem:   Lower extremity weakness Active Problems:   Type 2 diabetes mellitus with diabetic polyneuropathy, without long-term current use of insulin (HCC)   DVT femoral (deep venous thrombosis) with thrombophlebitis, right ([place])   Primary pancreatic cancer with metastasis to other site [facility])   Hypokalemia   SVT (supraventricular tachycardia) ([place])  Discharge Condition: stable  Filed Weights   06/02/20 1544 06/03/20 0135  Weight: 104.8 kg 107.3 kg    History of present illness:  Ajmal Kathan Jden Want 59 y.o.malewith medical history significant forPancreatic cancer with metastasis to liver, lung and lymph nodes, recent PE and DVT, type 2 diabetes and hyperlipidemiawho presents with recurrent falls and bilateral lower extremity weakness.  Wife at bedside provides most of the history as patient was fatigued and asleep during most of the evaluation. She reports that for the past several weeks he has continued to have recurrent falls due to bilateral lower extremity weakness. She had to go out and buy him Tylea Hise walker and even with that he was continuing to have trouble keeping his balance. He reports his right leg being weaker and also feels numbness to theentire rightextremity below the hip. He fell in the bathtub this past week and has had ankle swelling. Denies any saddle  anesthesia or bowel orbladder incontinence. Wife also reports that he had Starletta Houchin fever up to 102 two days ago after receiving Yakov Bergen Lovenox shot but she just gave him 2 Tylenols and he has not had any fever since. Denies any coughing or runny nose. He has chronic shortness of breath due to metastasis of cancer to his lungs. Denies any nausea, vomiting or diarrhea. His last bowel movement was 2 days ago.  Reportedly patient was started on Xarelto for Jakaylah Schlafer saddle embolus pulmonary embolism found on 04/20/2020. He was also diagnosed with an extensive right DVT on 6/11 and started on Lovenox last week. However wife has been doing both Lovenox and Xarelto. She denies noticing any bleeding.  In the ED, he was afebrile, mildly hypertensive on room air. No leukocytosis. Has stable anemia. K of 2.9. Glucose of 256. AST elevated 146 and ALT of 174 with alkaline phosphatase of 522 which appears to be chronic for him. Lactate of 2.7.  CT lumbar spine shows new sclerotic metastatic lesion in the lumbar spine and bony pelvis without any fracture or subluxation. There is also lumbar spondylosis and DDD causing osseous foraminal narrowing bilaterally at L4-5 and L5-S1 potentially on the left L2-3 and L3-4.  CT thoracic shows widespread sclerotic osseous metastatic disease in the thoracic spine.  Patient not able to tolerate MRI due to anxiety and refused.  Hospital Course:  Bilateral lower extremity weakness  Concern for Cord Compression in Setting of Metastatic Disease:  CT L and T spine with widespread sclerotic osseous metastatic disease in thoracic spine as well as new sclerotic metastatic lesions in the lumbar spine and bony pelvis  Repeat MRI L/T spine motion degraded,  but no gross epidural tumor identified, no thoracic spinal cord compression, cauda equina grossly unremarkable, poorly visualized conus MRI brain without acute intracranial abnormality or evidence of metastatic disease on this  unenhanced study.  Possible 6 mm L MCA aneurysm. Appreciate oncology recommendations ->  Looking into possibility of Lumakras.  Poor overall prognosis (Dr. Marin Olp noted he "would be surprised if he makes it through July if we cannot help the tumor)  History of PE/recent DVT Discharge home with lovenox   Poststenotic Fusiform Aneurysm measuring 6x6x4 mm: discussed with neurology, could follow up with IR, but given prognosis, would follow up with Dr. Marin Olp to discuss based on his prognosis and goals of care.  Tachycardia: cardiology c/s, appreciate recs.  Sinus tach with PVC's on EKG.  Runs of SVT and VT.  HR elevated due to acute illness.  Cardiology c/s, appreciate recs TSH wnl Echo with EF 76-73%, grade 1 diastolic dysfunction  Metoprolol QID -> discharged with metoprolol 50 mg BID  Type 2 diabetes Basal and bolus insulin Adjust with steroids A1c 04/2020 8.9  Hypokalemia Repleted. Will recheck in the morning.  Elevated lactate Resolved  History of metastatic pancreatic cancer Continue Creon, Marinol  Elevated LFTs: likely related to metastatic disease, follow  Procedures: Echo IMPRESSIONS    1. Left ventricular ejection fraction, by estimation, is 70 to 75%. The  left ventricle has hyperdynamic function. The left ventricle has no  regional wall motion abnormalities. Left ventricular diastolic parameters  are consistent with Grade I diastolic  dysfunction (impaired relaxation).  2. Right ventricular systolic function is hyperdynamic. The right  ventricular size is normal.  3. Left atrial size was mild to moderately dilated.  4. The mitral valve is normal in structure. Trivial mitral valve  regurgitation.  5. The aortic valve is normal in structure. Aortic valve regurgitation is  not visualized.  6. Aortic dilatation noted. There is borderline dilatation of the aortic  root and of the ascending aorta.  7. The inferior vena cava is normal in size with  greater than 50%  respiratory variability, suggesting right atrial pressure of 3 mmHg.   Consultations:  Oncology  cardiology  Discharge Exam: Vitals:   06/06/20 0018 06/06/20 0551  BP: 110/80 118/90  Pulse: 78 80  Resp:  18  Temp:  97.8 F (36.6 C)  SpO2:  99%   Feeling better  General: No acute distress. Cardiovascular: Heart sounds show Eugen Jeansonne regular rate, and rhythm. Lungs: Clear to auscultation bilaterally Abdomen: Soft, nontender, nondistended  Neurological: Alert and oriented 3. Moves all extremities 4.  4+ strength to LE bilaterally. Cranial nerves II through XII grossly intact. Skin: Warm and dry. No rashes or lesions. Extremities: No clubbing or cyanosis. No edema.   Discharge Instructions   Discharge Instructions    Call MD for:  difficulty breathing, headache or visual disturbances   Complete by: As directed    Call MD for:  extreme fatigue   Complete by: As directed    Call MD for:  hives   Complete by: As directed    Call MD for:  persistant dizziness or light-headedness   Complete by: As directed    Call MD for:  persistant nausea and vomiting   Complete by: As directed    Call MD for:  redness, tenderness, or signs of infection (pain, swelling, redness, odor or green/yellow discharge around incision site)   Complete by: As directed    Call MD for:  severe uncontrolled pain   Complete by:  As directed    Call MD for:  temperature >100.4   Complete by: As directed    Diet - low sodium heart healthy   Complete by: As directed    Discharge instructions   Complete by: As directed    You were seen for weakness in your legs.  The imaging was unrevealing for Octavion Mollenkopf cause, but you seem to have improved.    Dr. Marin Olp discussed next steps regarding your cancer.  It sounds like he is going to try to obtain Bharath Bernstein new treatment for you, but he'll follow outpatient with you.    We'll discharge you with home health services.   Please stop your xarelto.  Continue to  take the lovenox.  You have an aneurysm.  Neurology said that you can follow up with interventional radiology, but given your cancer, I'd like you to discuss this with Dr. Marin Olp.   Return for new, recurrent, or worsening symptoms.  Please ask your PCP to request records from this hospitalization so they know what was done and what the next steps will be.   Increase activity slowly   Complete by: As directed    No wound care   Complete by: As directed      Allergies as of 06/06/2020   No Known Allergies     Medication List    STOP taking these medications   losartan 25 MG tablet Commonly known as: COZAAR   rivaroxaban 20 MG Tabs tablet Commonly known as: XARELTO     TAKE these medications   atorvastatin 10 MG tablet Commonly known as: LIPITOR Take 1 tablet (10 mg total) by mouth daily.   Creon 24000-76000 units Cpep Generic drug: Pancrelipase (Lip-Prot-Amyl) TAKE 3 CAPSULE BY MOUTH IN THE MORNING, NOON, AND AT NIGHT AT BEDTIME What changed: See the new instructions.   dexamethasone 4 MG tablet Commonly known as: DECADRON Take 4 mg by mouth 2 (two) times daily with Berta Denson meal. Takes 2 tabs PO daily, start day after chemo x 3 days with food.   dronabinol 5 MG capsule Commonly known as: MARINOL Take 1 capsule (5 mg total) by mouth 2 (two) times daily before lunch and supper.   enoxaparin 120 MG/0.8ML injection Commonly known as: LOVENOX Inject 0.67 mLs (100 mg total) into the skin every 12 (twelve) hours.   fentaNYL 12 MCG/HR Commonly known as: [place] 1 patch onto the skin every 3 (three) days.   fluconazole 100 MG tablet Commonly known as: DIFLUCAN Take 1 tablet (100 mg total) by mouth daily.   gabapentin 300 MG capsule Commonly known as: NEURONTIN Take 1 capsule (300 mg total) by mouth 3 (three) times daily.   HYDROcodone-acetaminophen 5-325 MG tablet Commonly known as: NORCO/VICODIN Take 1-2 tablets by mouth every 6 (six) hours as needed for  moderate pain or severe pain.   HYDROcodone-homatropine 5-1.5 MG/5ML syrup Commonly known as: Hycodan Take 5 mLs by mouth every 6 (six) hours as needed for cough.   lidocaine-prilocaine cream Commonly known as: EMLA Apply 1 application topically as needed. Use on portacath as directed approx 1-2 hours prior to chemotherapy   loperamide 2 MG capsule Commonly known as: Imodium Djuan Talton-D Take 1 capsule (2 mg total) by mouth as needed for diarrhea or loose stools. Take 2 tablets at onset of diarrhea, then 1 every 2 hours until 12 hours without Casper Pagliuca BM.   LORazepam 0.5 MG tablet Commonly known as: ATIVAN TAKE 1 TABLET BY MOUTH EVERY 8 HOURS AS NEEDED FOR NAUSEA  metFORMIN 1000 MG tablet Commonly known as: GLUCOPHAGE Take 1 tablet (1,000 mg total) by mouth 2 (two) times daily with Maegan Buller meal. What changed: when to take this   metoCLOPramide 10 MG tablet Commonly known as: REGLAN Take 0.5 tablets (5 mg total) by mouth as needed for nausea (nausea/reflux).   metoprolol tartrate 25 MG tablet Commonly known as: LOPRESSOR Take 2 tablets (50 mg total) by mouth 2 (two) times daily.   NovoLOG FlexPen 100 UNIT/ML FlexPen Generic drug: insulin aspart Inject 15 Units into the skin 3 (three) times daily with meals.   ondansetron 8 MG disintegrating tablet Commonly known as: ZOFRAN-ODT Take 1 tablet (8 mg total) by mouth every 8 (eight) hours as needed for nausea.   ondansetron 8 MG tablet Commonly known as: ZOFRAN Take 1 tablet (8 mg total) by mouth 2 (two) times daily. As needed.  Start on Day 3 after chemotherapy.   Pen Needles 30G X 8 MM Misc 1 each by Does not apply route as directed.   prochlorperazine 10 MG tablet Commonly known as: COMPAZINE Take 1 tablet (10 mg total) by mouth every 6 (six) hours as needed for nausea or vomiting.      No Known Allergies    The results of significant diagnostics from this hospitalization (including imaging, microbiology, ancillary and laboratory) are  listed below for reference.    Significant Diagnostic Studies: CT ANGIO HEAD W OR WO CONTRAST  Result Date: 06/06/2020 CLINICAL DATA:  Recurrent falls. Bilateral lower extremity weakness. Patient complains lower extremity weakness right greater than left. Abnormal MRI of the head. EXAM: CT ANGIOGRAPHY HEAD TECHNIQUE: Multidetector CT imaging of the head was performed using the standard protocol during bolus administration of intravenous contrast. Multiplanar CT image reconstructions and MIPs were obtained to evaluate the vascular anatomy. CONTRAST:  20mL OMNIPAQUE IOHEXOL 350 MG/ML SOLN COMPARISON:  MR head without contrast 06/05/2020 FINDINGS: CT HEAD Brain: No acute infarct, hemorrhage, or mass lesion is present. The ventricles are of normal size. No significant extraaxial fluid collection is present. The brainstem and cerebellum are within normal limits. Vascular: No hyperdense vessel or unexpected calcification. Skull: Calvarium is intact. No focal lytic or blastic lesions are present. No significant extracranial soft tissue lesion is present. Sinuses: The paranasal sinuses and mastoid air cells are clear. Orbits: The globes and orbits are within normal limits. CTA HEAD Anterior circulation: The internal carotid arteries are within normal limits from the high cervical segments through the ICA termini bilaterally. The A1 and M1 segments are within normal limits. The right A1 and M1 segments are normal. The left A1 segment is normal. Moderate narrowing is present in the distal left M1 segment with Seth Friedlander poststenotic fusiform aneurysm measuring 6 x 6 x 4 mm just proximal to the bifurcation. The MCA and ACA branch vessels are within normal limits otherwise. Posterior circulation: Vertebral arteries are codominant. PICA origins are visualized and normal. Vertebrobasilar junction is normal. The basilar artery is normal. The right posterior cerebral artery originates from basilar tip. The left posterior cerebral  artery is of fetal type with Nnaemeka Samson small left P1 segment contributing. PCA branch vessels are within normal limits bilaterally. Venous sinuses: The dural sinuses are patent. Straight sinus deep cerebral veins are intact. Cortical veins are unremarkable. No vascular malformation is evident. Anatomic variants: Fetal type left posterior cerebral artery. IMPRESSION: 1. Moderate narrowing of the distal left M1 segment with Hanad Leino poststenotic fusiform aneurysm measuring 6 x 6 x 4 mm just proximal to the bifurcation.  2. No other significant proximal stenosis, aneurysm, or branch vessel occlusion within the Circle of Willis. 3. Normal CT appearance of the brain. Electronically Signed   By: San Morelle M.D.   On: 06/06/2020 11:31   DG Chest 2 View  Result Date: 05/28/2020 CLINICAL DATA:  59 year old male with history of metastatic pancreatic cancer. Recent history of fall. EXAM: CHEST - 2 VIEW COMPARISON:  Chest x-ray 05/07/2020. FINDINGS: Right internal jugular single-lumen power porta cath with tip terminating in the distal superior vena cava. Diffuse reticulonodular pattern throughout the lungs bilaterally, very similar to the prior study. No confluent consolidative airspace disease. No pleural effusions. No evidence of pulmonary edema. No pneumothorax. Heart size is normal. Upper mediastinal contours are within normal limits. IMPRESSION: 1. Widespread metastatic disease to the lungs appear similar to the prior examination, without definite radiographic evidence of acute cardiopulmonary disease. Electronically Signed   By: Vinnie Langton M.D.   On: 05/28/2020 12:07   CT Thoracic Spine Wo Contrast  Result Date: 06/02/2020 CLINICAL DATA:  Metastatic pancreatic adenocarcinoma. Back pain and bilateral leg weakness. EXAM: CT THORACIC SPINE WITHOUT CONTRAST TECHNIQUE: Multidetector CT images of the thoracic were obtained using the standard protocol without intravenous contrast. COMPARISON:  MRI thoracic spine from  06/02/2020 FINDINGS: Alignment: No vertebral subluxation is observed. Vertebrae: Widespread osseous metastatic disease in the thoracic spine. Index left eccentric lesion at T3 measures 2.5 by 1.8 by 2.2 cm. Occasional rib lesions noted. Posterior element involvement at multiple levels notably at T1, T2, T3, and T4. Paraspinal and other soft tissues: Miliary nodules throughout the lungs probably from widespread metastatic disease to the lungs. If the patient is immunocompromised than miliary infection might also be considered. Small to moderate left and small right pleural effusions. Right Port-Myrella Fahs-Cath tip: Cavoatrial junction. Aortic and coronary atherosclerosis. Scattered hypodense lesions in the liver compatible with metastatic disease. Pancreatic tail mass. Suspected perihepatic ascites. CT is not considered sensitive for epidural tumor. Disc levels: No significant osseous foraminal narrowing is identified in the thoracic spine. IMPRESSION: 1. Widespread sclerotic osseous metastatic disease in the thoracic spine. Please note that today's exam is not considered sensitive in ruling out epidural tumor. 2. Miliary nodules throughout the lungs probably from widespread metastatic disease to the lungs. If the patient is immunocompromised than miliary infection might also be considered. 3. Hepatic metastatic disease. 4. Pancreatic tail mass. 5. Small to moderate left and small right pleural effusions. 6. Suspected perihepatic ascites. 7. Aortic atherosclerosis. Aortic Atherosclerosis (ICD10-I70.0). Electronically Signed   By: Van Clines M.D.   On: 06/02/2020 19:53   CT Lumbar Spine Wo Contrast  Result Date: 06/02/2020 CLINICAL DATA:  Back pain. Bilateral leg weakness, right greater than left. Metastatic pancreatic cancer. EXAM: CT LUMBAR SPINE WITHOUT CONTRAST TECHNIQUE: Multidetector CT imaging of the lumbar spine was performed without intravenous contrast administration. Multiplanar CT image reconstructions  were also generated. COMPARISON:  03/08/2020 CT abdomen FINDINGS: Segmentation: The lowest lumbar type non-rib-bearing vertebra is labeled as L5. Alignment: No vertebral subluxation is observed. Vertebrae: Scattered new sclerotic metastatic lesions in the lumbar spine and bony pelvis. These include Donya Hitch 2.4 by 2.3 by 2.8 cm sclerotic lesion anteriorly in the L1 vertebral body; Violet Seabury 4.0 by 3.3 by 3.4 cm sclerotic lesion eccentric to the right in the L5 vertebral body, and multiple sclerotic lesions in the visualized iliac bones and sacrum, as well as some other smaller vertebral lesions. No current fracture.  Bridging spurring anteriorly at T11-T12-L1. Paraspinal and other soft tissues: Indistinct  pancreatic tail compatible with mass, abutting the spleen. Aortoiliac atherosclerotic vascular disease. Disc levels: Facet and uncinate spurring contribute to osseous foraminal narrowing bilaterally at L4-5 and L5-S1, and potentially on the left at L2-3 and L3-4 as well. IMPRESSION: 1. New sclerotic metastatic lesions in the lumbar spine and bony pelvis, without fracture or vertebral subluxation. 2. Indistinct pancreatic tail compatible with mass, abutting the spleen. 3. Lumbar spondylosis and degenerative disc disease causing osseous foraminal narrowing bilaterally at L4-5 and L5-S1, and potentially on the left at L2-3 and L3-4 as well. 4. Aortic atherosclerosis. Aortic Atherosclerosis (ICD10-I70.0). Electronically Signed   By: Van Clines M.D.   On: 06/02/2020 19:47   MR BRAIN WO CONTRAST  Result Date: 06/05/2020 CLINICAL DATA:  Difficulty ambulating. Bilateral lower extremity weakness with recurrent falls. History of metastatic pancreatic cancer. EXAM: MRI HEAD WITHOUT CONTRAST TECHNIQUE: Multiplanar, multiecho pulse sequences of the brain and surrounding structures were obtained without intravenous contrast. COMPARISON:  None. FINDINGS: The study is mildly motion degraded. Brain: There is no evidence of acute  infarct, intracranial hemorrhage, mass, midline shift, or extra-axial fluid collection. The ventricles and sulci are normal. No significant white matter disease is evident. Vascular: Major intracranial vascular flow voids are preserved. 6 x 4 mm T2 hypointense focus in the general region of the left MCA bifurcation (series 9, image 11), suspected to be vascular in origin and possibly reflecting an aneurysm. Skull and upper cervical spine: Unremarkable bone marrow signal. Sinuses/Orbits: Unremarkable orbits. Paranasal sinuses and mastoid air cells are clear. Other: None. IMPRESSION: 1. No acute intracranial abnormality or evidence of metastatic disease on this unenhanced study. 2. Possible 6 mm left MCA aneurysm. Nonemergent head MRA or CTA is recommended for further evaluation when the patient's condition improves and he is better able to remain motionless. Electronically Signed   By: Logan Bores M.D.   On: 06/05/2020 16:27   MR THORACIC SPINE WO CONTRAST  Result Date: 06/04/2020 CLINICAL DATA:  Back pain and bilateral leg weakness. Metastatic pancreatic cancer. EXAM: MRI THORACIC AND LUMBAR SPINE WITHOUT CONTRAST TECHNIQUE: Multiplanar and multiecho pulse sequences of the thoracic and lumbar spine were obtained without intravenous contrast. COMPARISON:  Thoracic and lumbar spine CT 06/02/2020. Thoracic spine MRI 06/02/2020. FINDINGS: The patient could not tolerate the examination which was terminated prematurely. IV contrast was not administered, and planned postcontrast sequences (including sagittal T2 imaging) were not obtained. The study is motion degraded throughout including severe motion artifact on all axial thoracic spine sequences which are largely nondiagnostic. MRI THORACIC SPINE FINDINGS Alignment:  No listhesis. Vertebrae: As seen on the prior studies, there are multiple sclerotic metastases with extensive involvement of the upper thoracic spine (with the largest lesions involving the T3 and T4  vertebral bodies and T2-T4 posterior elements). No gross epidural tumor is identified although assessment for small volume/noncompressive epidural tumor is limited on this study. Preserved vertebral body heights without Arbell Wycoff gross fracture identified. Cord:  No cord compression. Paraspinal and other soft tissues: Small right and small to moderate left pleural effusions. Disc levels: Widespread bridging vertebral osteophytes in the thoracic spine. MRI LUMBAR SPINE FINDINGS Segmentation:  Standard. Alignment:  Normal. Vertebrae: Large sclerotic lesions in the L1 and L5 vertebral bodies. Additional sclerotic lesions partially visualized in the sacrum and ilia. No definite epidural tumor. Preserved vertebral body heights without Shaketa Serafin fracture identified. Conus medullaris and cauda equina: Conus ends at or above the T12 level and is poorly visualized. Cauda equina is grossly unremarkable. Paraspinal and other soft  tissues: Grossly unremarkable. Disc levels: L1-2: Mild disc bulging and facet and ligamentum flavum hypertrophy result in borderline to mild spinal stenosis without significant neural foraminal stenosis. L2-3: Mild disc bulging and mild facet and ligamentum flavum hypertrophy without evidence of significant stenosis. L3-4: Mild disc bulging and mild facet and ligamentum flavum hypertrophy result in mild spinal stenosis, mild bilateral lateral recess stenosis, and mild left neural foraminal stenosis. L4-5: Minimal disc bulging and moderate facet hypertrophy result in mild left neural foraminal stenosis without significant spinal stenosis. L5-S1: Asymmetric severe right facet arthrosis result in mild right neural foraminal stenosis without spinal stenosis. IMPRESSION: 1. Incomplete, motion degraded examination including largely nondiagnostic axial thoracic spine imaging. No IV contrast administered. 2. Widespread osseous metastases including extensive involvement of the upper thoracic spine. No gross epidural tumor  identified. 3. No thoracic spinal cord compression. 4. Lumbar spondylosis and facet arthrosis with mild spinal stenosis at L3-4. Electronically Signed   By: Logan Bores M.D.   On: 06/04/2020 21:42   MR THORACIC SPINE WO CONTRAST  Result Date: 06/02/2020 CLINICAL DATA:  Mid back pain with bilateral leg weakness. Metastatic pancreatic cancer. EXAM: MRI THORACIC SPINE WITHOUT CONTRAST (LIMITED) TECHNIQUE: Coronal and sagittal localizing images the spine were obtained. The patient was not able to complete the examination. No axial imaging was performed. No intravenous contrast was administered. COMPARISON:  Chest radiographs 05/28/2020 and chest CTA 04/20/2020. FINDINGS: Alignment:  Normal. Vertebrae: Widespread osseous metastatic disease. In the thoracic spine, there are lesions within the T1, T2, T3, T4, T5 and T6 vertebral bodies. There is involvement of the posterior elements at T1, T2 and T3. No significant pathologic fracture identified, although Payton Moder small amount of epidural tumor difficult to exclude at T3. Metastases are also present within the L1 and L5 vertebral bodies. Cord:  No gross cord compression on localizing images. IMPRESSION: 1. Extremely limited study terminated after the scout images were obtained. 2. Widespread osseous metastatic disease throughout the thoracic spine as described. No significant pathologic fracture identified, although Ovila Lepage small amount of epidural tumor difficult to exclude at T3. 3. No gross cord compression on localizing images. 4. Consider further evaluation with CT or repeat MR after appropriate sedation. Electronically Signed   By: Richardean Sale M.D.   On: 06/02/2020 17:57   MR LUMBAR SPINE WO CONTRAST  Result Date: 06/04/2020 CLINICAL DATA:  Back pain and bilateral leg weakness. Metastatic pancreatic cancer. EXAM: MRI THORACIC AND LUMBAR SPINE WITHOUT CONTRAST TECHNIQUE: Multiplanar and multiecho pulse sequences of the thoracic and lumbar spine were obtained without  intravenous contrast. COMPARISON:  Thoracic and lumbar spine CT 06/02/2020. Thoracic spine MRI 06/02/2020. FINDINGS: The patient could not tolerate the examination which was terminated prematurely. IV contrast was not administered, and planned postcontrast sequences (including sagittal T2 imaging) were not obtained. The study is motion degraded throughout including severe motion artifact on all axial thoracic spine sequences which are largely nondiagnostic. MRI THORACIC SPINE FINDINGS Alignment:  No listhesis. Vertebrae: As seen on the prior studies, there are multiple sclerotic metastases with extensive involvement of the upper thoracic spine (with the largest lesions involving the T3 and T4 vertebral bodies and T2-T4 posterior elements). No gross epidural tumor is identified although assessment for small volume/noncompressive epidural tumor is limited on this study. Preserved vertebral body heights without Aminah Zabawa gross fracture identified. Cord:  No cord compression. Paraspinal and other soft tissues: Small right and small to moderate left pleural effusions. Disc levels: Widespread bridging vertebral osteophytes in the thoracic spine. MRI  LUMBAR SPINE FINDINGS Segmentation:  Standard. Alignment:  Normal. Vertebrae: Large sclerotic lesions in the L1 and L5 vertebral bodies. Additional sclerotic lesions partially visualized in the sacrum and ilia. No definite epidural tumor. Preserved vertebral body heights without Colbi Schiltz fracture identified. Conus medullaris and cauda equina: Conus ends at or above the T12 level and is poorly visualized. Cauda equina is grossly unremarkable. Paraspinal and other soft tissues: Grossly unremarkable. Disc levels: L1-2: Mild disc bulging and facet and ligamentum flavum hypertrophy result in borderline to mild spinal stenosis without significant neural foraminal stenosis. L2-3: Mild disc bulging and mild facet and ligamentum flavum hypertrophy without evidence of significant stenosis. L3-4: Mild  disc bulging and mild facet and ligamentum flavum hypertrophy result in mild spinal stenosis, mild bilateral lateral recess stenosis, and mild left neural foraminal stenosis. L4-5: Minimal disc bulging and moderate facet hypertrophy result in mild left neural foraminal stenosis without significant spinal stenosis. L5-S1: Asymmetric severe right facet arthrosis result in mild right neural foraminal stenosis without spinal stenosis. IMPRESSION: 1. Incomplete, motion degraded examination including largely nondiagnostic axial thoracic spine imaging. No IV contrast administered. 2. Widespread osseous metastases including extensive involvement of the upper thoracic spine. No gross epidural tumor identified. 3. No thoracic spinal cord compression. 4. Lumbar spondylosis and facet arthrosis with mild spinal stenosis at L3-4. Electronically Signed   By: Logan Bores M.D.   On: 06/04/2020 21:42   US Venous Img Lower Unilateral Right (DVT)  Result Date: 05/28/2020 CLINICAL DATA:  Right lower extremity pain and edema. History of malignancy. Evaluate for DVT. EXAM: RIGHT LOWER EXTREMITY VENOUS DOPPLER ULTRASOUND TECHNIQUE: Gray-scale sonography with graded compression, as well as color Doppler and duplex ultrasound were performed to evaluate the lower extremity deep venous systems from the level of the common femoral vein and including the common femoral, femoral, profunda femoral, popliteal and calf veins including the posterior tibial, peroneal and gastrocnemius veins when visible. The superficial great saphenous vein was also interrogated. Spectral Doppler was utilized to evaluate flow at rest and with distal augmentation maneuvers in the common femoral, femoral and popliteal veins. COMPARISON:  None. FINDINGS: Contralateral Common Femoral Vein: Respiratory phasicity is normal and symmetric with the symptomatic side. No evidence of thrombus. Normal compressibility. Common Femoral Vein: No evidence of thrombus. Normal  compressibility, respiratory phasicity and response to augmentation. Saphenofemoral Junction: No evidence of thrombus. Normal compressibility and flow on color Doppler imaging. Profunda Femoral Vein: No evidence of thrombus. Normal compressibility and flow on color Doppler imaging. Femoral Vein: There is hypoechoic occlusive thrombus involving the proximal (image 80 and 9), mid (images 12 and 13) and distal (images 14 and 15) aspects of right femoral vein. Popliteal Vein: There is hypoechoic occlusive thrombus involving the right popliteal vein (images 19 and 21). Calf Veins: There is hypoechoic occlusive thrombus involving right posterior tibial (image 28) and peroneal (image 30) veins. Superficial Great Saphenous Vein: No evidence of thrombus. Normal compressibility. Venous Reflux:  None. Other Findings: There is hypoechoic occlusive thrombus involving the right gastrocnemius vein (images 22 through 25). IMPRESSION: 1. The examination is positive for occlusive DVT extending from the proximal aspect of the right femoral vein through the imaged right tibial veins. 2. Examination is also positive for occlusive superficial thrombophlebitis involving the right gastrocnemius vein. Electronically Signed   By: Sandi Mariscal M.D.   On: 05/28/2020 12:32   ECHOCARDIOGRAM COMPLETE  Result Date: 06/03/2020    ECHOCARDIOGRAM REPORT   Patient Name:   BREN STEERS Date of Exam: 06/03/2020  Medical Rec #:  [id number]   Height:       75.0 in Accession #:    [id number]  Weight:       236.6 lb Date of Birth:  1961/11/25  BSA:          2.357 m Patient Age:    58 years    BP:           154/101 mmHg Patient Gender: M           HR:           136 bpm. Exam Location:  Inpatient Procedure: 2D Echo, Cardiac Doppler and Color Doppler Indications:    Tachycardia  History:        Patient has no prior history of Echocardiogram examinations.                 Risk Factors:Diabetes, Dyslipidemia and Former Smoker. DVT.  Sonographer:    Vickie Epley  RDCS Referring Phys: [id number] Rakiyah Esch CALDWELL Rea  1. Left ventricular ejection fraction, by estimation, is 70 to 75%. The left ventricle has hyperdynamic function. The left ventricle has no regional wall motion abnormalities. Left ventricular diastolic parameters are consistent with Grade I diastolic dysfunction (impaired relaxation).  2. Right ventricular systolic function is hyperdynamic. The right ventricular size is normal.  3. Left atrial size was mild to moderately dilated.  4. The mitral valve is normal in structure. Trivial mitral valve regurgitation.  5. The aortic valve is normal in structure. Aortic valve regurgitation is not visualized.  6. Aortic dilatation noted. There is borderline dilatation of the aortic root and of the ascending aorta.  7. The inferior vena cava is normal in size with greater than 50% respiratory variability, suggesting right atrial pressure of 3 mmHg. FINDINGS  Left Ventricle: Left ventricular ejection fraction, by estimation, is 70 to 75%. The left ventricle has hyperdynamic function. The left ventricle has no regional wall motion abnormalities. The left ventricular internal cavity size was normal in size. There is no left ventricular hypertrophy. Left ventricular diastolic parameters are consistent with Grade I diastolic dysfunction (impaired relaxation). Right Ventricle: The right ventricular size is normal. No increase in right ventricular wall thickness. Right ventricular systolic function is hyperdynamic. Left Atrium: Left atrial size was mild to moderately dilated. Right Atrium: Right atrial size was normal in size. Pericardium: There is no evidence of pericardial effusion. Mitral Valve: The mitral valve is normal in structure. Trivial mitral valve regurgitation. Tricuspid Valve: The tricuspid valve is normal in structure. Tricuspid valve regurgitation is trivial. Aortic Valve: The aortic valve is normal in structure. Aortic valve regurgitation is not  visualized. Pulmonic Valve: The pulmonic valve was normal in structure. Pulmonic valve regurgitation is trivial. Aorta: Aortic dilatation noted. There is borderline dilatation of the aortic root and of the ascending aorta. Venous: The inferior vena cava is normal in size with greater than 50% respiratory variability, suggesting right atrial pressure of 3 mmHg. IAS/Shunts: No atrial level shunt detected by color flow Doppler.  LEFT VENTRICLE PLAX 2D LVIDd:         4.23 cm LVIDs:         3.17 cm LV PW:         0.96 cm LV IVS:        0.98 cm LVOT diam:     2.30 cm LV SV:         52 LV SV Index:   22 LVOT Area:     4.15  cm  LV Volumes (MOD) LV vol d, MOD A2C: 77.1 ml LV vol d, MOD A4C: 78.5 ml LV vol s, MOD A2C: 34.9 ml LV vol s, MOD A4C: 34.1 ml LV SV MOD A2C:     42.2 ml LV SV MOD A4C:     78.5 ml LV SV MOD BP:      46.0 ml RIGHT VENTRICLE RV S prime:     12.40 cm/s TAPSE (M-mode): 1.5 cm LEFT ATRIUM             Index       RIGHT ATRIUM           Index LA diam:        3.90 cm 1.65 cm/m  RA Area:     13.90 cm LA Vol (A2C):   47.6 ml 20.20 ml/m RA Volume:   28.60 ml  12.14 ml/m LA Vol (A4C):   46.3 ml 19.65 ml/m LA Biplane Vol: 47.7 ml 20.24 ml/m  AORTIC VALVE LVOT Vmax:   81.40 cm/s LVOT Vmean:  60.100 cm/s LVOT VTI:    0.125 m  AORTA Ao Root diam: 4.20 cm Ao Asc diam:  4.10 cm  SHUNTS Systemic VTI:  0.12 m Systemic Diam: 2.30 cm Glori Bickers MD Electronically signed by Glori Bickers MD Signature Date/Time: 06/03/2020/2:39:21 PM    Final    DG FEMUR, MIN 2 VIEWS RIGHT  Result Date: 06/02/2020 CLINICAL DATA:  Clot in right leg EXAM: RIGHT FEMUR 2 VIEWS COMPARISON:  None. FINDINGS: There is no evidence of fracture or other focal bone lesions. Soft tissues are unremarkable. IMPRESSION: Negative. Electronically Signed   By: Donavan Foil M.D.   On: 06/02/2020 19:34    Microbiology: Recent Results (from the past 240 hour(s))  Culture, Urine     Status: Abnormal   Collection Time: 06/02/20  1:49 PM    Specimen: Urine, Clean Catch  Result Value Ref Range Status   Specimen Description   Final    URINE, CLEAN CATCH Performed at [facility] at [facility], [address] [place], [place] [address]    Special Requests   Final    NONE Performed at [facility] at [facility], [address] [place], [place] [address]    Culture (Shloime Keilman)  Final    <10,000 COLONIES/mL INSIGNIFICANT GROWTH Performed at [facility], [address] [place], [place] [address]    Report Status 06/03/2020 FINAL  Final  Blood culture (routine x 2)     Status: None (Preliminary result)   Collection Time: 06/02/20  4:50 PM   Specimen: BLOOD  Result Value Ref Range Status   Specimen Description   Final    BLOOD RIGHT ANTECUBITAL Performed at [facility] [address] [place], [place] [address]    Special Requests   Final    BOTTLES DRAWN AEROBIC AND ANAEROBIC Blood Culture results may not be optimal due to an excessive volume of blood received in culture bottles Performed at [facility] [address], [place], [place] [address]    Culture   Final    NO GROWTH 4 DAYS Performed at [facility], [place] [address] [place], [place] [address]    Report Status PENDING  Incomplete  SARS Coronavirus 2 by RT PCR (hospital order, performed in [facility] hospital lab) Nasopharyngeal Nasopharyngeal Swab     Status: None   Collection Time: 06/02/20  5:22 PM   Specimen: Nasopharyngeal Swab  Result Value Ref Range Status   SARS Coronavirus 2 NEGATIVE NEGATIVE Final    Comment: (NOTE) SARS-CoV-2 target nucleic acids are NOT DETECTED.  The SARS-CoV-2 RNA is generally detectable in upper and lower respiratory specimens during the acute phase of infection. The lowest concentration of SARS-CoV-2 viral copies this assay can detect is 250 copies / mL. Javiana Anwar negative result does not preclude SARS-CoV-2 infection and should  not be used as the sole basis for treatment or other patient management decisions.  Graciela Plato negative result may occur with improper specimen collection / handling, submission of specimen other than nasopharyngeal swab, presence of viral mutation(s) within the areas targeted by this assay, and inadequate number of viral copies (<250 copies / mL). Bernard Donahoo negative result must be combined with clinical observations, patient history, and epidemiological information.  Fact Sheet for Patients:   StrictlyIdeas.no  Fact Sheet for Healthcare Providers: BankingDealers.co.za  This test is not yet approved or  cleared by the [place] FDA and has been authorized for detection and/or diagnosis of SARS-CoV-2 by FDA under an Emergency Use Authorization (EUA).  This EUA will remain in effect (meaning this test can be used) for the duration of the COVID-19 declaration under Section 564(b)(1) of the Act, 21 U.S.C. section 360bbb-3(b)(1), unless the authorization is terminated or revoked sooner.  Performed at [facility], [place] [address] [place], [place] [address]   Blood culture (routine x 2)     Status: None (Preliminary result)   Collection Time: 06/02/20  6:02 PM   Specimen: BLOOD  Result Value Ref Range Status   Specimen Description   Final    BLOOD LEFT ANTECUBITAL Performed at [facility] [address], [place], [place] [address]    Special Requests   Final    BOTTLES DRAWN AEROBIC AND ANAEROBIC Blood Culture adequate volume Performed at [facility] [address] [place], [place] [address]    Culture   Final    NO GROWTH 4 DAYS Performed at [facility], [place] [address], [place], [place] [address]    Report Status PENDING  Incomplete     Labs: Basic Metabolic Panel: Recent Labs  Lab 06/02/20 1612 06/02/20 1612 06/02/20 1701 06/03/20 0200 06/04/20 0303 06/05/20 0340  06/06/20 0339  NA 143   < > 144 139 135 133* 140  K 2.9*   < > 2.7* 3.8 3.5 3.8 3.3*  CL 105   < > 100 100 99 97* 100  CO2 30  --   --  29 29 30  32  GLUCOSE 256*   < > 229* 302* 276* 435* 199*  BUN 12   < > 10 10 10 14 16   CREATININE 0.47*   < > 0.40* 0.48* 0.46* 0.54* 0.41*  CALCIUM 7.8*  --   --  7.8* 7.5* 7.5* 7.9*  MG  --   --   --   --  1.5* 2.0 1.9  PHOS  --   --   --   --  3.5 3.9 3.8   < > = values in this interval not displayed.   Liver Function Tests: Recent Labs  Lab 06/02/20 1612 06/04/20 0303 06/05/20 0340 06/06/20 0339  AST 146* 82* 61* 105*  ALT 174* 123* 102* 115*  ALKPHOS 522* 513* 483* 507*  BILITOT 1.0 1.0 1.0 0.9  PROT 5.7* 5.1* 4.9* 4.8*  ALBUMIN 2.5* 2.3* 2.3* 2.2*   Recent  Labs  Lab 06/02/20 1612  LIPASE 17   No results for input(s): AMMONIA in the last 168 hours. CBC: Recent Labs  Lab 06/02/20 1047 06/02/20 1047 06/02/20 1612 06/02/20 1612 06/02/20 1701 06/03/20 0200 06/04/20 0303 06/05/20 0340 06/06/20 0339  WBC 4.4  --  3.9*  --   --  2.1* 4.5 5.0 6.7  NEUTROABS 2.9  --  2.4  --   --   --  3.5 3.7 4.0  HGB 10.9*  --  10.9*   < > 10.9* 10.6* 10.8* 10.8* 10.5*  HCT 36.2*   < > 35.1*   < > 32.0* 33.5* 33.9* 34.0* 33.1*  MCV 97.8   < > 98.6  --   --  96.0 94.7 95.5 97.4  PLT 196  --  197  --   --  139* 133* 123* 119*   < > = values in this interval not displayed.   Cardiac Enzymes: No results for input(s): CKTOTAL, CKMB, CKMBINDEX, TROPONINI in the last 168 hours. BNP: BNP (last 3 results) No results for input(s): BNP in the last 8760 hours.  ProBNP (last 3 results) No results for input(s): PROBNP in the last 8760 hours.  CBG: Recent Labs  Lab 06/05/20 2106 06/06/20 0016 06/06/20 0337 06/06/20 0732 06/06/20 1137  GLUCAP 231* 218* 121* 196* 222*       Signed:  Fayrene Helper MD.  [facility] 06/06/2020, 7:42 PM
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
## 2020-06-06 NOTE — Progress Notes (Addendum)
Physical Therapy Treatment Patient Details Name: Melvin [name] MRN: [id number] DOB: 1961/02/24 Today's Date: 06/06/2020    History of Present Illness Melvin [name] is a 59 y.o. male with medical history significant for Pancreatic cancer with metastasis to liver, lung and lymph nodes, recent PE and DVT, type 2 diabetes and hyperlipidemia who presents with recurrent falls and bilateral lower extremity weakness.MRI -Widespread osseous metastases including extensive involvement ofthe upper thoracic spine. No gross epidural tumor identifiedT L and T spine with widespread sclerotic osseous metastatic disease in thoracic spine as well as new sclerotic metastatic lesions in the lumbar spine and bony pelvis    PT Comments    Progressing slowly with mobility. Pt remains weak and fatigues fairly easily. He remains at risk for falls when mobilizing. Wife present during session. She stated she will have assistance from other family members. Pt also has a wheelchair available for use at home. Pt stated he may possibly d/c home later today.     Follow Up Recommendations  Home health PT;Supervision/Assistance - 24 hour     Equipment Recommendations  None recommended by PT    Recommendations for Other Services       Precautions / Restrictions Precautions Precautions: Fall Precaution Comments: legs  subject to buckle Restrictions Weight Bearing Restrictions: No    Mobility  Bed Mobility Overal bed mobility: Needs Assistance Bed Mobility: Sidelying to Sit   Sidelying to sit: Min assist;HOB elevated       General bed mobility comments: Cues for safety, technique. Assist for LEs to EOB and for trunk to full upright. Pt relied on bedrail. Increaesd time.  Transfers Overall transfer level: Needs assistance Equipment used: Rolling walker (2 wheeled) Transfers: Sit to/from Stand Sit to Stand: Min assist;From elevated surface         General transfer comment: Assist to rise, stabilize, control  descent. VCs safety, technique, hand placement. Increased time.  Ambulation/Gait Ambulation/Gait assistance: Min assist; +2 safety/equipment Gait Distance (Feet): 6 Feet (x2)         General Gait Details: 6 feet forwards then backwards x 2 with seated rest break in between short walks. Pt fatigues easily. Assist to stabilize throughout. Has R foot drop but able to mobilize okay.   Stairs             Wheelchair Mobility    Modified Rankin (Stroke Patients Only)       Balance Overall balance assessment: History of Falls;Needs assistance         Standing balance support: Bilateral upper extremity supported Standing balance-Leahy Scale: Poor                              Cognition Arousal/Alertness: Awake/alert Behavior During Therapy: WFL for tasks assessed/performed Overall Cognitive Status: Within Functional Limits for tasks assessed                                 General Comments: responds appropriately      Exercises      General Comments        Pertinent Vitals/Pain Pain Assessment: No/denies pain    Home Living                      Prior Function            PT Goals (current goals can now be found in the care  plan section) Progress towards PT goals: Progressing toward goals    Frequency    Min 3X/week      PT Plan Current plan remains appropriate    Co-evaluation              AM-PAC PT "6 Clicks" Mobility   Outcome Measure  Help needed turning from your back to your side while in a flat bed without using bedrails?: A Little Help needed moving from lying on your back to sitting on the side of a flat bed without using bedrails?: A Little Help needed moving to and from a bed to a chair (including a wheelchair)?: A Little Help needed standing up from a chair using your arms (e.g., wheelchair or bedside chair)?: A Little Help needed to walk in hospital room?: A Lot Help needed climbing 3-5  steps with a railing? : Total 6 Click Score: 15    End of Session Equipment Utilized During Treatment: Gait belt Activity Tolerance: Patient tolerated treatment well Patient left: in bed;with call bell/phone within reach;with family/visitor present   PT Visit Diagnosis: Muscle weakness (generalized) (M62.81);Unsteadiness on feet (R26.81);Repeated falls (R29.6)     Time: 1000-1016 PT Time Calculation (min) (ACUTE ONLY): 16 min  Charges:  $Gait Training: 8-22 mins                         Doreatha Massed, PT Acute Rehabilitation  Office: [phone] Pager: [phone]

## 2020-06-07 ENCOUNTER — Other Ambulatory Visit: Payer: Self-pay | Admitting: Hematology & Oncology

## 2020-06-07 ENCOUNTER — Telehealth: Payer: Self-pay | Admitting: *Deleted

## 2020-06-07 LAB — CULTURE, BLOOD (ROUTINE X 2)
Culture: NO GROWTH
Culture: NO GROWTH
Special Requests: ADEQUATE

## 2020-06-07 MED FILL — PROCHLORPERAZINE 10 MG TAB: 10 | 7 days supply | Qty: 30 | Fill #0

## 2020-06-07 MED FILL — METOPROLOL TARTRATE 25 MG T: 25 | 30 days supply | Qty: 120 | Fill #0

## 2020-06-07 MED FILL — fentaNYL 12 MCG/HR PT72: 12 | 30 days supply | Qty: 10 | Fill #0

## 2020-06-07 MED FILL — FLUCONAZOLE 100 MG TABLET: 100 | 21 days supply | Qty: 21 | Fill #2

## 2020-06-07 MED FILL — LORazepam 0.5 MG TABS: 0.5 | 10 days supply | Qty: 30 | Fill #0

## 2020-06-07 NOTE — Telephone Encounter (Signed)
Call received from patient's wife wanting to know next steps for patient.  Pt.'s wife notified per order of Dr. Marin Olp that prescription for [facility] has been sent and that Dr. Marin Olp would like to see patient this week.  Pt.'s wife appreciative of assistance.  Message sent to scheduling.

## 2020-06-08 MED FILL — DRONABINOL 5 MG CAPSULE: 5 | 30 days supply | Qty: 60 | Fill #0

## 2020-06-11 ENCOUNTER — Encounter: Payer: Self-pay | Admitting: *Deleted

## 2020-06-11 ENCOUNTER — Inpatient Hospital Stay (HOSPITAL_BASED_OUTPATIENT_CLINIC_OR_DEPARTMENT_OTHER): Payer: PRIVATE HEALTH INSURANCE | Admitting: Hematology & Oncology

## 2020-06-11 ENCOUNTER — Other Ambulatory Visit: Payer: Self-pay

## 2020-06-11 ENCOUNTER — Encounter: Payer: Self-pay | Admitting: Hematology & Oncology

## 2020-06-11 ENCOUNTER — Inpatient Hospital Stay: Payer: PRIVATE HEALTH INSURANCE

## 2020-06-11 ENCOUNTER — Telehealth: Payer: Self-pay | Admitting: Pharmacy Technician

## 2020-06-11 VITALS — BP 149/107 | HR 73 | Temp 97.1°F | Resp 17 | Wt 235.0 lb

## 2020-06-11 DIAGNOSIS — Z5111 Encounter for antineoplastic chemotherapy: Secondary | ICD-10-CM | POA: Diagnosis not present

## 2020-06-11 DIAGNOSIS — C259 Malignant neoplasm of pancreas, unspecified: Secondary | ICD-10-CM

## 2020-06-11 DIAGNOSIS — C787 Secondary malignant neoplasm of liver and intrahepatic bile duct: Secondary | ICD-10-CM | POA: Diagnosis not present

## 2020-06-11 DIAGNOSIS — Z95828 Presence of other vascular implants and grafts: Secondary | ICD-10-CM

## 2020-06-11 LAB — CMP (CANCER CENTER ONLY)
ALT: 82 U/L — ABNORMAL HIGH (ref 0–44)
AST: 47 U/L — ABNORMAL HIGH (ref 15–41)
Albumin: 2.8 g/dL — ABNORMAL LOW (ref 3.5–5.0)
Alkaline Phosphatase: 509 U/L — ABNORMAL HIGH (ref 38–126)
Anion gap: 7 (ref 5–15)
BUN: 12 mg/dL (ref 6–20)
CO2: 29 mmol/L (ref 22–32)
Calcium: 8.3 mg/dL — ABNORMAL LOW (ref 8.9–10.3)
Chloride: 103 mmol/L (ref 98–111)
Creatinine: 0.55 mg/dL — ABNORMAL LOW (ref 0.61–1.24)
GFR, Est AFR Am: >60 mL/min (ref >60)
GFR, Estimated: >60 mL/min (ref >60)
Glucose, Bld: 284 mg/dL — ABNORMAL HIGH (ref 70–99)
Potassium: 3.5 mmol/L (ref 3.5–5.1)
Sodium: 139 mmol/L (ref 135–145)
Total Bilirubin: 1.2 mg/dL (ref 0.3–1.2)
Total Protein: 5.4 g/dL — ABNORMAL LOW (ref 6.5–8.1)

## 2020-06-11 LAB — CBC WITH DIFFERENTIAL (CANCER CENTER ONLY)
Band Neutrophils: 0 %
Basophils Absolute: 0 10*3/uL (ref 0.0–0.1)
Basophils Relative: 0 %
Eosinophils Absolute: 0.1 10*3/uL (ref 0.0–0.5)
Eosinophils Relative: 2 %
HCT: 36.6 % — ABNORMAL LOW (ref 39.0–52.0)
Hemoglobin: 11.4 g/dL — ABNORMAL LOW (ref 13.0–17.0)
Lymphocytes Relative: 14 %
Lymphs Abs: 0.9 10*3/uL (ref 0.7–4.0)
MCH: 30.8 pg (ref 26.0–34.0)
MCHC: 31.1 g/dL (ref 30.0–36.0)
MCV: 98.9 fL (ref 80.0–100.0)
Monocytes Absolute: 0.7 10*3/uL (ref 0.1–1.0)
Monocytes Relative: 11 %
Neutro Abs: 4.5 10*3/uL (ref 1.7–7.7)
Neutrophils Relative %: 72 %
Platelet Count: 156 10*3/uL (ref 150–400)
RBC: 3.7 MIL/uL — ABNORMAL LOW (ref 4.22–5.81)
RDW: 19.4 % — ABNORMAL HIGH (ref 11.5–15.5)
WBC Count: 6.3 10*3/uL (ref 4.0–10.5)
nRBC: 0 % (ref 0.0–0.2)

## 2020-06-11 MED ORDER — SODIUM CHLORIDE 0.9% FLUSH
10.0000 mL | Freq: Once | INTRAVENOUS | Status: AC
  Administered 2020-06-11: 10 mL via INTRAVENOUS
  Filled 2020-06-11: qty 10

## 2020-06-11 MED ORDER — HEPARIN SOD (PORK) LOCK FLUSH 100 UNIT/ML IV SOLN
500.0000 [IU] | Freq: Once | INTRAVENOUS | Status: AC
  Administered 2020-06-11: 500 [IU] via INTRAVENOUS
  Filled 2020-06-11: qty 5

## 2020-06-11 NOTE — Progress Notes (Signed)
Hematology and Oncology Follow Up Visit  Melvin [name] [id number] 24-Jul-1961 59 y.o. 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  We last saw him, he was admitted to the hospital.  He had a new blood clot in the right leg.  He was found to have metastasis  to his spine.  There is no cord compression.  He actually looks a whole lot  better.  I am not sure exactly why he is doing well.  He is actually walking.  He is incredibly determined to try to improve.  I am trying to get the new K-RAS inhibitor for him.  I realize that this is approved for lung cancer.  However, given that he has pancreatic cancer that expresses the K-ras gene, I think that it would be reasonable to try to have him take this.  His last CA 19-9 actually was better.  It was down to 102,000.  He seems to be eating well.  As always, his blood sugars have been quite high.  At this point, I am really not worried about that.  He really is not complaining much in the way of pain.  There is no nausea or vomiting.  He is on Marinol now which his wife thinks is helping.  He is not having any diarrhea.  Overall, I would say his performance status has improved ECOG 1.    Medications:  Allergies as of 06/11/2020   No Known Allergies     Medication List       Accurate as of June 11, 2020  5:09 PM. If you have any questions, ask your nurse or doctor.        atorvastatin 10 MG tablet Commonly known as:  LIPITOR Take 1 tablet (10 mg total) by mouth daily.   Creon 24000-76000 units Cpep Generic drug: Pancrelipase (Lip-Prot-Amyl) TAKE 3 CAPSULE BY MOUTH IN THE MORNING, NOON, AND AT NIGHT AT BEDTIME What changed: See the new instructions.   dexamethasone 4 MG tablet Commonly known as: DECADRON Take 4 mg by mouth 2 (two) times daily with a meal. Takes 2 tabs PO daily, start day after chemo x 3 days with food.   dronabinol 5 MG capsule Commonly known as: MARINOL TAKE 1 CAPSULE BY MOUTH TWICE DAILY BEFORE LUNCH AND SUPPER   enoxaparin 120 MG/0.8ML injection Commonly known as: LOVENOX Inject 0.67 mLs (100 mg total) into the skin every 12 (twelve) hours.   fentaNYL 12 MCG/HR Commonly known as: DURAGESIC PLACE 1 PATCH ONTO THE SKIN EVERY 3 DAYS   fluconazole 100 MG tablet Commonly known as: DIFLUCAN Take 1 tablet (100 mg total) by mouth daily.   gabapentin 300 MG capsule Commonly known as: NEURONTIN Take 1 capsule (300 mg total) by mouth 3 (three) times daily.   HYDROcodone-acetaminophen 5-325 MG tablet Commonly known as: NORCO/VICODIN Take 1-2 tablets by mouth every 6 (six) hours as needed for moderate pain or severe pain.   HYDROcodone-homatropine 5-1.5 MG/5ML  syrup Commonly known as: Hycodan Take 5 mLs by mouth every 6 (six) hours as needed for cough.   lidocaine-prilocaine cream Commonly known as: EMLA Apply 1 application topically as needed. Use on portacath as directed approx 1-2 hours prior to chemotherapy   loperamide 2 MG capsule Commonly known as: Imodium A-D Take 1 capsule (2 mg total) by mouth as needed for diarrhea or loose stools. Take 2 tablets at onset of diarrhea, then 1 every 2 hours until 12 hours without a BM.   LORazepam 0.5 MG tablet Commonly known as: ATIVAN TAKE 1 TABLET BY MOUTH EVERY 8 HOURS AS NEEDED FOR NAUSEA   metFORMIN 1000 MG tablet Commonly known as: GLUCOPHAGE Take 1 tablet (1,000 mg total) by mouth 2 (two) times daily with a meal. What  changed: when to take this   metoCLOPramide 10 MG tablet Commonly known as: REGLAN Take 0.5 tablets (5 mg total) by mouth as needed for nausea (nausea/reflux).   metoprolol tartrate 25 MG tablet Commonly known as: LOPRESSOR Take 2 tablets (50 mg total) by mouth 2 (two) times daily.   NovoLOG FlexPen 100 UNIT/ML FlexPen Generic drug: insulin aspart Inject 15 Units into the skin 3 (three) times daily with meals.   ondansetron 8 MG disintegrating tablet Commonly known as: ZOFRAN-ODT Take 1 tablet (8 mg total) by mouth every 8 (eight) hours as needed for nausea.   ondansetron 8 MG tablet Commonly known as: ZOFRAN Take 1 tablet (8 mg total) by mouth 2 (two) times daily. As needed.  Start on Day 3 after chemotherapy.   Pen Needles 30G X 8 MM Misc 1 each by Does not apply route as directed.   prochlorperazine 10 MG tablet Commonly known as: COMPAZINE TAKE 1 TABLET BY MOUTH EVERY 6 HOURS AS NEEDED FOR NAUSEA & VOMITING       Allergies: No Known Allergies  Past Medical History, Surgical history, Social history, and Family History were reviewed and updated.  Review of Systems: Review of Systems  Constitutional: Positive for malaise/fatigue.  HENT: Negative.   Eyes: Negative.   Respiratory: Positive for cough.   Cardiovascular: Positive for leg swelling.  Gastrointestinal: Positive for diarrhea and vomiting.  Genitourinary: Negative.   Musculoskeletal: Positive for myalgias.  Neurological: Positive for tingling.  Endo/Heme/Allergies: Negative.   Psychiatric/Behavioral: Negative.      Physical Exam:  vitals were not taken for this visit.   Wt Readings from Last 3 Encounters:  06/11/20 235 lb (106.6 kg)  06/03/20 236 lb 8.9 oz (107.3 kg)  05/28/20 231 lb 1.9 oz (104.8 kg)    His vital signs show a temperature of 97.1.  Pulse 73.  Blood pressure 149/100.  Weight is 235 pounds.  Physical Exam Vitals reviewed.  HENT:     Head: Normocephalic and atraumatic.  Eyes:      Pupils: Pupils are equal, round, and reactive to light.  Cardiovascular:     Rate and Rhythm: Normal rate and regular rhythm.     Heart sounds: Normal heart sounds.  Pulmonary:     Effort: Pulmonary effort is normal.     Breath sounds: Normal breath sounds.  Abdominal:     General: Bowel sounds are normal.     Palpations: Abdomen is soft.  Musculoskeletal:        General: No tenderness or deformity. Normal range of motion.     Cervical back: Normal range of motion.  Lymphadenopathy:     Cervical: No cervical adenopathy.  Skin:    General:  Skin is warm and dry.     Findings: No erythema or rash.  Neurological:     Mental Status: He is alert and oriented to person, place, and time.  Psychiatric:        Behavior: Behavior normal.        Thought Content: Thought content normal.        Judgment: Judgment normal.    .  Lab Results  Component Value Date   WBC 6.3 06/11/2020   HGB 11.4 (L) 06/11/2020   HCT 36.6 (L) 06/11/2020   MCV 98.9 06/11/2020   PLT 156 06/11/2020   No results found for: FERRITIN, IRON, TIBC, UIBC, IRONPCTSAT Lab Results  Component Value Date   RBC 3.70 (L) 06/11/2020   No results found for: KPAFRELGTCHN, LAMBDASER, KAPLAMBRATIO No results found for: IGGSERUM, IGA, IGMSERUM No results found for: Ronnald Ramp, A1GS, A2GS, Violet Baldy, MSPIKE, SPEI   Chemistry      Component Value Date/Time   NA 139 06/11/2020 1020   K 3.5 06/11/2020 1020   CL 103 06/11/2020 1020   CO2 29 06/11/2020 1020   BUN 12 06/11/2020 1020   CREATININE 0.55 (L) 06/11/2020 1020   CREATININE 0.75 03/08/2020 1058      Component Value Date/Time   CALCIUM 8.3 (L) 06/11/2020 1020   ALKPHOS 509 (H) 06/11/2020 1020   AST 47 (H) 06/11/2020 1020   ALT 82 (H) 06/11/2020 1020   BILITOT 1.2 06/11/2020 1020       Impression and Plan: Ms. Purk is a very pleasant 59 yo caucasian gentleman with metastatic adenocarcinoma of the pancreas with extensive disease  involving the liver, lungs and lymph nodes.   Again, I am not sure if we will be able to get the K-ras inhibitor for him.  If we can, if I will take a few weeks.  We will have to send the request to the company.  Since he is looking better, and his performance status is better, and the CA 19-9 is improved, I think that would be reasonable to continue the chemotherapy.  I actually, I think he has tolerated it pretty well.  I realize this is a very complex problem.  He is doing his best.  His family is incredibly supportive.  He is really fighting as hard as he can fight.  I just hope that we can help him.  We will see about restarting treatment next week.  If, somehow, we get the K-ras inhibitor before then, we will just have him take that medication.  We spent about 45 minutes with him today.  This is still pretty complex and there are many issues that we had to sort out.Marland Kitchen  Volanda Napoleon, MD 6/25/20215:09 PM

## 2020-06-11 NOTE — Patient Instructions (Signed)

## 2020-06-11 NOTE — Addendum Note (Signed)
Addended by: Shelda Altes on: 06/11/2020 01:16 PM   Modules accepted: Orders

## 2020-06-11 NOTE — Telephone Encounter (Signed)
Oral Oncology [name] Advocate Encounter  Theone Murdoch, pharmacist, met with [name] at appointment today to complete application for [place] in an effort to reduce [name]'s out of pocket expense for Lumakras to $0.    Application completed and faxed to [phone].   [employer] [name] assistance phone number for follow up is [phone].   This encounter will be updated until final determination.   Melvin [name] [place] Phone [phone] Fax [phone] 06/11/2020 3:26 PM

## 2020-06-11 NOTE — Progress Notes (Signed)
Patient feeling better after last weeks hospitalization. He is here to talk to Dr Marin Olp about a new treatment option.  Dr Marin Olp had prescribed an off label medication which require drug assistance as it's not covered by insurance. Helped patient and his family complete the three pages of forms for financial assistance. Obtained Dr Antonieta Pert signature on the MD form. Completed forms given to Cheyenne Adas to fax into the specialty pharmacy.   Oncology Nurse Navigator Documentation  Oncology Nurse Navigator Flowsheets 06/11/2020  Abnormal Finding Date -  Diagnosis Status -  Planned Course of Treatment -  Phase of Treatment -  Chemotherapy Actual Start Date: -  Navigator Follow Up Date: 06/25/2020  Navigator Follow Up Reason: Follow-up Appointment  Navigator Location [facility]  Referral Date to RadOnc/MedOnc -  Navigator Encounter Type Treatment  Telephone -  Treatment Initiated Date -  Patient Visit Type MedOnc  Treatment Phase Active Tx  Barriers/Navigation Needs Anxiety;Financial Toxicity;Morbidities/Frailty;Pain;Underinsured  Education -  Interventions Medication Assistance;Psycho-Social Support  Acuity Level 3-Moderate Needs (3-4 Barriers Identified)  Referrals -  Coordination of Care -  Education Method -  Support Groups/Services Friends and Family  Time Spent with Patient 23

## 2020-06-11 NOTE — Telephone Encounter (Signed)
Is this the patient you were looking for?

## 2020-06-14 ENCOUNTER — Telehealth: Payer: Self-pay | Admitting: Hematology & Oncology

## 2020-06-14 NOTE — Telephone Encounter (Signed)
Called and spoke with wife regarding appointments that have been added per 6/25 los

## 2020-06-14 NOTE — Telephone Encounter (Signed)
No ma'am. Thank you

## 2020-06-16 NOTE — Telephone Encounter (Signed)
Called to check the status of patients application for assistance.  Rep stated that she could let the person processing the application know to call me this afternoon with a status update.  [place] Patient [place] Phone [phone] Fax [phone] 06/16/2020 12:01 PM

## 2020-06-18 ENCOUNTER — Inpatient Hospital Stay: Payer: PRIVATE HEALTH INSURANCE | Attending: Hematology & Oncology

## 2020-06-18 ENCOUNTER — Encounter: Payer: Self-pay | Admitting: *Deleted

## 2020-06-18 ENCOUNTER — Inpatient Hospital Stay (HOSPITAL_BASED_OUTPATIENT_CLINIC_OR_DEPARTMENT_OTHER): Payer: PRIVATE HEALTH INSURANCE | Admitting: Family

## 2020-06-18 ENCOUNTER — Other Ambulatory Visit: Payer: Self-pay | Admitting: Hematology & Oncology

## 2020-06-18 ENCOUNTER — Encounter: Payer: Self-pay | Admitting: Family

## 2020-06-18 ENCOUNTER — Inpatient Hospital Stay: Payer: PRIVATE HEALTH INSURANCE

## 2020-06-18 ENCOUNTER — Other Ambulatory Visit: Payer: Self-pay

## 2020-06-18 ENCOUNTER — Other Ambulatory Visit: Payer: Self-pay | Admitting: Family

## 2020-06-18 VITALS — BP 159/111 | HR 76 | Temp 98.0°F | Wt 228.0 lb

## 2020-06-18 DIAGNOSIS — C779 Secondary and unspecified malignant neoplasm of lymph node, unspecified: Secondary | ICD-10-CM | POA: Insufficient documentation

## 2020-06-18 DIAGNOSIS — C259 Malignant neoplasm of pancreas, unspecified: Secondary | ICD-10-CM | POA: Diagnosis present

## 2020-06-18 DIAGNOSIS — I82451 Acute embolism and thrombosis of right peroneal vein: Secondary | ICD-10-CM | POA: Diagnosis not present

## 2020-06-18 DIAGNOSIS — C787 Secondary malignant neoplasm of liver and intrahepatic bile duct: Secondary | ICD-10-CM

## 2020-06-18 DIAGNOSIS — R531 Weakness: Secondary | ICD-10-CM | POA: Insufficient documentation

## 2020-06-18 DIAGNOSIS — M549 Dorsalgia, unspecified: Secondary | ICD-10-CM | POA: Insufficient documentation

## 2020-06-18 DIAGNOSIS — C799 Secondary malignant neoplasm of unspecified site: Secondary | ICD-10-CM

## 2020-06-18 DIAGNOSIS — R188 Other ascites: Secondary | ICD-10-CM | POA: Insufficient documentation

## 2020-06-18 DIAGNOSIS — R161 Splenomegaly, not elsewhere classified: Secondary | ICD-10-CM | POA: Insufficient documentation

## 2020-06-18 DIAGNOSIS — I2699 Other pulmonary embolism without acute cor pulmonale: Secondary | ICD-10-CM | POA: Diagnosis not present

## 2020-06-18 DIAGNOSIS — Z79899 Other long term (current) drug therapy: Secondary | ICD-10-CM | POA: Insufficient documentation

## 2020-06-18 DIAGNOSIS — I82411 Acute embolism and thrombosis of right femoral vein: Secondary | ICD-10-CM | POA: Insufficient documentation

## 2020-06-18 DIAGNOSIS — Z7901 Long term (current) use of anticoagulants: Secondary | ICD-10-CM | POA: Diagnosis not present

## 2020-06-18 DIAGNOSIS — Z5111 Encounter for antineoplastic chemotherapy: Secondary | ICD-10-CM | POA: Insufficient documentation

## 2020-06-18 DIAGNOSIS — C7800 Secondary malignant neoplasm of unspecified lung: Secondary | ICD-10-CM | POA: Diagnosis not present

## 2020-06-18 DIAGNOSIS — Z95828 Presence of other vascular implants and grafts: Secondary | ICD-10-CM

## 2020-06-18 LAB — CMP (CANCER CENTER ONLY)
ALT: 65 U/L — ABNORMAL HIGH (ref 0–44)
AST: 57 U/L — ABNORMAL HIGH (ref 15–41)
Albumin: 3 g/dL — ABNORMAL LOW (ref 3.5–5.0)
Alkaline Phosphatase: 602 U/L — ABNORMAL HIGH (ref 38–126)
Anion gap: 6 (ref 5–15)
BUN: 9 mg/dL (ref 6–20)
CO2: 30 mmol/L (ref 22–32)
Calcium: 8.6 mg/dL — ABNORMAL LOW (ref 8.9–10.3)
Chloride: 105 mmol/L (ref 98–111)
Creatinine: 0.5 mg/dL — ABNORMAL LOW (ref 0.61–1.24)
GFR, Est AFR Am: >60 mL/min (ref >60)
GFR, Estimated: >60 mL/min (ref >60)
Glucose, Bld: 186 mg/dL — ABNORMAL HIGH (ref 70–99)
Potassium: 3.5 mmol/L (ref 3.5–5.1)
Sodium: 141 mmol/L (ref 135–145)
Total Bilirubin: 1.6 mg/dL — ABNORMAL HIGH (ref 0.3–1.2)
Total Protein: 5.8 g/dL — ABNORMAL LOW (ref 6.5–8.1)

## 2020-06-18 LAB — CBC WITH DIFFERENTIAL (CANCER CENTER ONLY)
Abs Immature Granulocytes: 0.06 10*3/uL (ref 0.00–0.07)
Basophils Absolute: 0 10*3/uL (ref 0.0–0.1)
Basophils Relative: 0 %
Eosinophils Absolute: 0.1 10*3/uL (ref 0.0–0.5)
Eosinophils Relative: 2 %
HCT: 37.8 % — ABNORMAL LOW (ref 39.0–52.0)
Hemoglobin: 11.7 g/dL — ABNORMAL LOW (ref 13.0–17.0)
Immature Granulocytes: 1 %
Lymphocytes Relative: 16 %
Lymphs Abs: 1.1 10*3/uL (ref 0.7–4.0)
MCH: 30.6 pg (ref 26.0–34.0)
MCHC: 31 g/dL (ref 30.0–36.0)
MCV: 99 fL (ref 80.0–100.0)
Monocytes Absolute: 0.8 10*3/uL (ref 0.1–1.0)
Monocytes Relative: 12 %
Neutro Abs: 4.7 10*3/uL (ref 1.7–7.7)
Neutrophils Relative %: 69 %
Platelet Count: 214 10*3/uL (ref 150–400)
RBC: 3.82 MIL/uL — ABNORMAL LOW (ref 4.22–5.81)
RDW: 19.8 % — ABNORMAL HIGH (ref 11.5–15.5)
WBC Count: 6.8 10*3/uL (ref 4.0–10.5)
nRBC: 0 % (ref 0.0–0.2)

## 2020-06-18 MED ORDER — PROCHLORPERAZINE MALEATE 10 MG PO TABS
ORAL_TABLET | ORAL | Status: AC
  Filled 2020-06-18: qty 1

## 2020-06-18 MED ORDER — SODIUM CHLORIDE 0.9 % IV SOLN
800.0000 mg/m2 | Freq: Once | INTRAVENOUS | Status: AC
  Administered 2020-06-18: 1862 mg via INTRAVENOUS
  Filled 2020-06-18: qty 48.97

## 2020-06-18 MED ORDER — GABAPENTIN 300 MG PO CAPS: 300.0000 mg | ORAL_CAPSULE | Freq: Three times a day (TID) | ORAL | 1 refills | Status: AC

## 2020-06-18 MED ORDER — SODIUM CHLORIDE 0.9 % IV SOLN
Freq: Once | INTRAVENOUS | Status: AC
  Filled 2020-06-18: qty 250

## 2020-06-18 MED ORDER — HYDROCODONE-ACETAMINOPHEN 5-325 MG PO TABS: 1.0000 | ORAL_TABLET | Freq: Four times a day (QID) | ORAL | 0 refills | Status: DC | PRN

## 2020-06-18 MED ORDER — SODIUM CHLORIDE 0.9% FLUSH
10.0000 mL | INTRAVENOUS | Status: DC | PRN
  Administered 2020-06-18: 10 mL
  Filled 2020-06-18: qty 10

## 2020-06-18 MED ORDER — LORAZEPAM 0.5 MG PO TABS: 0.5000 mg | ORAL_TABLET | Freq: Three times a day (TID) | ORAL | 0 refills | Status: DC | PRN

## 2020-06-18 MED ORDER — SODIUM CHLORIDE 0.9% FLUSH
10.0000 mL | Freq: Once | INTRAVENOUS | Status: DC
  Filled 2020-06-18: qty 10

## 2020-06-18 MED ORDER — PROCHLORPERAZINE MALEATE 10 MG PO TABS
10.0000 mg | ORAL_TABLET | Freq: Once | ORAL | Status: AC
  Administered 2020-06-18: 10 mg via ORAL

## 2020-06-18 MED ORDER — PACLITAXEL PROTEIN-BOUND CHEMO INJECTION 100 MG
100.0000 mg/m2 | Freq: Once | INTRAVENOUS | Status: AC
  Administered 2020-06-18: 225 mg via INTRAVENOUS
  Filled 2020-06-18: qty 45

## 2020-06-18 MED ORDER — HEPARIN SOD (PORK) LOCK FLUSH 100 UNIT/ML IV SOLN
500.0000 [IU] | Freq: Once | INTRAVENOUS | Status: AC | PRN
  Administered 2020-06-18: 500 [IU]
  Filled 2020-06-18: qty 5

## 2020-06-18 MED FILL — LORazepam 0.5 MG TABS: 0.5 | 20 days supply | Qty: 60 | Fill #0

## 2020-06-18 MED FILL — GABAPENTIN 300 MG CAPSULE: 300 | 30 days supply | Qty: 90 | Fill #0

## 2020-06-18 MED FILL — HYDROCODON-APAP 5-325: 5-325 | 15 days supply | Qty: 120 | Fill #0

## 2020-06-18 MED FILL — CREON DR 24,000 UNITS CAP: 24000-76000 | 10 days supply | Qty: 90 | Fill #1

## 2020-06-18 NOTE — Telephone Encounter (Signed)
Oral Oncology Patient Advocate Encounter  Received notification from [employer] that patient has been successfully enrolled into their program to receive Lumakras from the manufacturer at $0 out of pocket until 12/17/20.    I called and spoke with patient.  He knows we will have to re-apply.   Patient knows to call the office with questions or concerns.   Oral Oncology Clinic will continue to follow.  Melvin [name] Phone [phone] Fax [phone] 06/18/2020 2:11 PM

## 2020-06-18 NOTE — Progress Notes (Signed)
Oncology Nurse Navigator Documentation  Oncology Nurse Navigator Flowsheets 06/18/2020  Abnormal Finding Date -  Diagnosis Status -  Planned Course of Treatment -  Phase of Treatment -  Chemotherapy Actual Start Date: -  Navigator Follow Up Date: 06/25/2020  Navigator Follow Up Reason: Follow-up Appointment  Navigator Location [facility]  Referral Date to RadOnc/MedOnc -  Navigator Encounter Type Treatment  Telephone -  Treatment Initiated Date -  Patient Visit Type MedOnc  Treatment Phase Active Tx  Barriers/Navigation Needs Anxiety;Financial Toxicity;Morbidities/Frailty;Pain;Underinsured  Education -  Interventions Psycho-Social Support  Acuity Level 3-Moderate Needs (3-4 Barriers Identified)  Referrals -  Coordination of Care -  Education Method -  Support Groups/Services Friends and Family  Time Spent with Patient 30

## 2020-06-18 NOTE — Progress Notes (Signed)
rec'd call from patient's wife, violetta. She stated the foundation called and oral medication was approved and they will mail to patient's home next week.

## 2020-06-18 NOTE — Patient Instructions (Signed)
Nanoparticle Albumin-Bound Paclitaxel injection What is this medicine? NANOPARTICLE ALBUMIN-BOUND PACLITAXEL (Na no PAHR ti kuhl al BYOO muhn-bound PAK li TAX el) is a chemotherapy drug. It targets fast dividing cells, like cancer cells, and causes these cells to die. This medicine is used to treat advanced breast cancer, lung cancer, and pancreatic cancer. This medicine may be used for other purposes; ask your health care provider or pharmacist if you have questions. COMMON BRAND NAME(S): Abraxane What should I tell my health care provider before I take this medicine? They need to know if you have any of these conditions:  kidney disease  liver disease  low blood counts, like low white cell, platelet, or red cell counts  lung or breathing disease, like asthma  tingling of the fingers or toes, or other nerve disorder  an unusual or allergic reaction to paclitaxel, albumin, other chemotherapy, other medicines, foods, dyes, or preservatives  pregnant or trying to get pregnant  breast-feeding How should I use this medicine? This drug is given as an infusion into a vein. It is administered in a hospital or clinic by a specially trained health care professional. Talk to your pediatrician regarding the use of this medicine in children. Special care may be needed. Overdosage: If you think you have taken too much of this medicine contact a poison control center or emergency room at once. NOTE: This medicine is only for you. Do not share this medicine with others. What if I miss a dose? It is important not to miss your dose. Call your doctor or health care professional if you are unable to keep an appointment. What may interact with this medicine? This medicine may interact with the following medications:  antiviral medicines for hepatitis, HIV or AIDS  certain antibiotics like erythromycin and clarithromycin  certain medicines for fungal infections like ketoconazole and  itraconazole  certain medicines for seizures like carbamazepine, phenobarbital, phenytoin  gemfibrozil  nefazodone  rifampin  St. John's wort This list may not describe all possible interactions. Give your health care provider a list of all the medicines, herbs, non-prescription drugs, or dietary supplements you use. Also tell them if you smoke, drink alcohol, or use illegal drugs. Some items may interact with your medicine. What should I watch for while using this medicine? Your condition will be monitored carefully while you are receiving this medicine. You will need important blood work done while you are taking this medicine. This medicine can cause serious allergic reactions. If you experience allergic reactions like skin rash, itching or hives, swelling of the face, lips, or tongue, tell your doctor or health care professional right away. In some cases, you may be given additional medicines to help with side effects. Follow all directions for their use. This drug may make you feel generally unwell. This is not uncommon, as chemotherapy can affect healthy cells as well as cancer cells. Report any side effects. Continue your course of treatment even though you feel ill unless your doctor tells you to stop. Call your doctor or health care professional for advice if you get a fever, chills or sore throat, or other symptoms of a cold or flu. Do not treat yourself. This drug decreases your body's ability to fight infections. Try to avoid being around people who are sick. This medicine may increase your risk to bruise or bleed. Call your doctor or health care professional if you notice any unusual bleeding. Be careful brushing and flossing your teeth or using a toothpick because you may   get an infection or bleed more easily. If you have any dental work done, tell your dentist you are receiving this medicine. Avoid taking products that contain aspirin, acetaminophen, ibuprofen, naproxen, or  ketoprofen unless instructed by your doctor. These medicines may hide a fever. Do not become pregnant while taking this medicine or for 6 months after stopping it. Women should inform their doctor if they wish to become pregnant or think they might be pregnant. Men should not father a child while taking this medicine or for 3 months after stopping it. There is a potential for serious side effects to an unborn child. Talk to your health care professional or pharmacist for more information. Do not breast-feed an infant while taking this medicine or for 2 weeks after stopping it. This medicine may interfere with the ability to get pregnant or to father a child. You should talk to your doctor or health care professional if you are concerned about your fertility. What side effects may I notice from receiving this medicine? Side effects that you should report to your doctor or health care professional as soon as possible:  allergic reactions like skin rash, itching or hives, swelling of the face, lips, or tongue  breathing problems  changes in vision  fast, irregular heartbeat  low blood pressure  mouth sores  pain, tingling, numbness in the hands or feet  signs of decreased platelets or bleeding - bruising, pinpoint red spots on the skin, black, tarry stools, blood in the urine  signs of decreased red blood cells - unusually weak or tired, feeling faint or lightheaded, falls  signs of infection - fever or chills, cough, sore throat, pain or difficulty passing urine  signs and symptoms of liver injury like dark yellow or brown urine; general ill feeling or flu-like symptoms; light-colored stools; loss of appetite; nausea; right upper belly pain; unusually weak or tired; yellowing of the eyes or skin  swelling of the ankles, feet, hands  unusually slow heartbeat Side effects that usually do not require medical attention (report to your doctor or health care professional if they continue or  are bothersome):  diarrhea  hair loss  loss of appetite  nausea, vomiting  tiredness This list may not describe all possible side effects. Call your doctor for medical advice about side effects. You may report side effects to FDA at 1-800-FDA-1088. Where should I keep my medicine? This drug is given in a hospital or clinic and will not be stored at home. NOTE: This sheet is a summary. It may not cover all possible information. If you have questions about this medicine, talk to your doctor, pharmacist, or health care provider.  2020 [employer]/Gold Standard (2017-08-07 13:03:45) Gemcitabine injection What is this medicine? GEMCITABINE (jem SYE ta been) is a chemotherapy drug. This medicine is used to treat many types of cancer like breast cancer, lung cancer, pancreatic cancer, and ovarian cancer. This medicine may be used for other purposes; ask your health care provider or pharmacist if you have questions. COMMON BRAND NAME(S): Gemzar, Infugem What should I tell my health care provider before I take this medicine? They need to know if you have any of these conditions:  blood disorders  infection  kidney disease  liver disease  lung or breathing disease, like asthma  recent or ongoing radiation therapy  an unusual or allergic reaction to gemcitabine, other chemotherapy, other medicines, foods, dyes, or preservatives  pregnant or trying to get pregnant  breast-feeding How should I use this medicine?  This drug is given as an infusion into a vein. It is administered in a hospital or clinic by a specially trained health care professional. Talk to your pediatrician regarding the use of this medicine in children. Special care may be needed. Overdosage: If you think you have taken too much of this medicine contact a poison control center or emergency room at once. NOTE: This medicine is only for you. Do not share this medicine with others. What if I miss a dose? It is important  not to miss your dose. Call your doctor or health care professional if you are unable to keep an appointment. What may interact with this medicine?  medicines to increase blood counts like filgrastim, pegfilgrastim, sargramostim  some other chemotherapy drugs like cisplatin  vaccines Talk to your doctor or health care professional before taking any of these medicines:  acetaminophen  aspirin  ibuprofen  ketoprofen  naproxen This list may not describe all possible interactions. Give your health care provider a list of all the medicines, herbs, non-prescription drugs, or dietary supplements you use. Also tell them if you smoke, drink alcohol, or use illegal drugs. Some items may interact with your medicine. What should I watch for while using this medicine? Visit your doctor for checks on your progress. This drug may make you feel generally unwell. This is not uncommon, as chemotherapy can affect healthy cells as well as cancer cells. Report any side effects. Continue your course of treatment even though you feel ill unless your doctor tells you to stop. In some cases, you may be given additional medicines to help with side effects. Follow all directions for their use. Call your doctor or health care professional for advice if you get a fever, chills or sore throat, or other symptoms of a cold or flu. Do not treat yourself. This drug decreases your body's ability to fight infections. Try to avoid being around people who are sick. This medicine may increase your risk to bruise or bleed. Call your doctor or health care professional if you notice any unusual bleeding. Be careful brushing and flossing your teeth or using a toothpick because you may get an infection or bleed more easily. If you have any dental work done, tell your dentist you are receiving this medicine. Avoid taking products that contain aspirin, acetaminophen, ibuprofen, naproxen, or ketoprofen unless instructed by your doctor.  These medicines may hide a fever. Do not become pregnant while taking this medicine or for 6 months after stopping it. Women should inform their doctor if they wish to become pregnant or think they might be pregnant. Men should not father a child while taking this medicine and for 3 months after stopping it. There is a potential for serious side effects to an unborn child. Talk to your health care professional or pharmacist for more information. Do not breast-feed an infant while taking this medicine or for at least 1 week after stopping it. Men should inform their doctors if they wish to father a child. This medicine may lower sperm counts. Talk with your doctor or health care professional if you are concerned about your fertility. What side effects may I notice from receiving this medicine? Side effects that you should report to your doctor or health care professional as soon as possible:  allergic reactions like skin rash, itching or hives, swelling of the face, lips, or tongue  breathing problems  pain, redness, or irritation at site where injected  signs and symptoms of a dangerous change  in heartbeat or heart rhythm like chest pain; dizziness; fast or irregular heartbeat; palpitations; feeling faint or lightheaded, falls; breathing problems  signs of decreased platelets or bleeding - bruising, pinpoint red spots on the skin, black, tarry stools, blood in the urine  signs of decreased red blood cells - unusually weak or tired, feeling faint or lightheaded, falls  signs of infection - fever or chills, cough, sore throat, pain or difficulty passing urine  signs and symptoms of kidney injury like trouble passing urine or change in the amount of urine  signs and symptoms of liver injury like dark yellow or brown urine; general ill feeling or flu-like symptoms; light-colored stools; loss of appetite; nausea; right upper belly pain; unusually weak or tired; yellowing of the eyes or  skin  swelling of ankles, feet, hands Side effects that usually do not require medical attention (report to your doctor or health care professional if they continue or are bothersome):  constipation  diarrhea  hair loss  loss of appetite  nausea  rash  vomiting This list may not describe all possible side effects. Call your doctor for medical advice about side effects. You may report side effects to FDA at 1-800-FDA-1088. Where should I keep my medicine? This drug is given in a hospital or clinic and will not be stored at home. NOTE: This sheet is a summary. It may not cover all possible information. If you have questions about this medicine, talk to your doctor, pharmacist, or health care provider.  2020 [employer]/Gold Standard (2018-02-27 18:06:11) Prochlorperazine tablets What is this medicine? PROCHLORPERAZINE (proe klor PER a zeen) helps to control severe nausea and vomiting. This medicine is also used to treat schizophrenia. It can also help patients who experience anxiety that is not due to psychological illness. This medicine may be used for other purposes; ask your health care provider or pharmacist if you have questions. COMMON BRAND NAME(S): Compazine What should I tell my health care provider before I take this medicine? They need to know if you have any of these conditions:  blockage in your bowel  brain tumor  dementia  diabetes  difficulty swallowing  glaucoma  have trouble controlling your muscles  head injury  heart disease  history of irregular heartbeat  if you often drink alcohol  liver disease  low blood counts, like low white cell, platelet, or red cell counts  low blood pressure  lung or breathing disease, like asthma  Parkinson's disease  prostate disease  seizures  trouble passing urine  an unusual or allergic reaction to prochlorperazine, other medicines, foods, dyes, or preservatives  pregnant or trying to get  pregnant  breast-feeding How should I use this medicine? Take this medicine by mouth with a glass of water. Follow the directions on the prescription label. Take your doses at regular intervals. Do not take your medicine more often than directed. Do not stop taking this medicine suddenly. This can cause nausea, vomiting, and dizziness. Ask your doctor or health care professional for advice. Talk to your pediatrician regarding the use of this medicine in children. Special care may be needed. While this drug may be prescribed for children as young as 2 years for selected conditions, precautions do apply. Overdosage: If you think you have taken too much of this medicine contact a poison control center or emergency room at once. NOTE: This medicine is only for you. Do not share this medicine with others. What if I miss a dose? If you miss a dose,  take it as soon as you can. If it is almost time for your next dose, take only that dose. Do not take double or extra doses. What may interact with this medicine? Do not take this medicine with any of the following medications:  cisapride  dofetilide  dronedarone  metoclopramide  pimozide  saquinavir  thioridazine This medicine may also interact with the following medications:  alcohol  antihistamines for allergy, cough, and cold  atropine  certain medicines for anxiety or sleep  certain medicines for bladder problems like oxybutynin, tolterodine  certain medicines for depression like amitriptyline, fluoxetine, sertraline  certain medicines for Parkinson's disease like benztropine, trihexyphenidyl  certain medicines for stomach problems like dicyclomine, hyoscyamine  certain medicines for travel sickness like scopolamine  epinephrine  general anesthetics like halothane, isoflurane, methoxyflurane, propofol  ipratropium  lithium  medicines for high blood pressure  medicines for seizures like phenobarbital, primidone,  phenytoin  medicines that relax muscles for surgery  narcotic medicines for pain  propranolol  warfarin This list may not describe all possible interactions. Give your health care provider a list of all the medicines, herbs, non-prescription drugs, or dietary supplements you use. Also tell them if you smoke, drink alcohol, or use illegal drugs. Some items may interact with your medicine. What should I watch for while using this medicine? Visit your health care professional for regular checks on your progress. Tell your health care professional if symptoms do not start to get better or if they get worse. You may get drowsy or dizzy. Do not drive, use machinery, or do anything that needs mental alertness until you know how this medicine affects you. Do not stand or sit up quickly, especially if you are an older patient. This reduces the risk of dizzy or fainting spells. Alcohol may interfere with the effect of this medicine. Avoid alcoholic drinks. This drug can cause problems with controlling your body temperature. It can lower the response of your body to cold temperatures. If possible, stay indoors during cold weather. If you must go outdoors, wear warm clothes. It can also lower the response of your body to heat. Do not overheat. Do not over-exercise. Stay out of the sun when possible. If you must be in the sun, wear cool clothing. Drink plenty of water. If you have trouble controlling your body temperature, call your health care provider right away. This medicine may increase blood sugar. Ask your health care provider if changes in diet or medicines are needed if you have diabetes. This medicine can make you more sensitive to the sun. Keep out of the sun. If you cannot avoid being in the sun, wear protective clothing and use sunscreen. Do not use sun lamps or tanning beds/booths. Your mouth may get dry. Chewing sugarless gum or sucking hard candy, and drinking plenty of water may help. Contact  your doctor if the problem does not go away or is severe. What side effects may I notice from receiving this medicine? Side effects that you should report to your doctor or health care professional as soon as possible:  allergic reactions like skin rash, itching or hives, swelling of the face, lips, or tongue  abnormal production of milk  breast enlargement in both males and females  changes in vision  chest pain  confusion  fast, irregular heartbeat  fever, chills, sore throat  seizures  signs and symptoms of high blood sugar such as being more thirsty or hungry or having to urinate more than normal. You  may also feel very tired or have blurry vision.  signs and symptoms of liver injury like dark yellow or brown urine; general ill feeling or flu-like symptoms; light-colored stools; loss of appetite; nausea; right upper belly pain; unusually weak or tired; yellowing of the eyes or skin  signs and symptoms of low blood pressure like dizziness; feeling faint or lightheaded, falls; unusually weak or tired  trouble passing urine or change in the amount of urine  trouble swallowing  uncontrollable movements of the arms, face, head, mouth, neck, or upper body  unusual bruising or bleeding  unusually weak or tired Side effects that usually do not require medical attention (report to your doctor or health care professional if they continue or are bothersome):  constipation  drowsiness  dry mouth This list may not describe all possible side effects. Call your doctor for medical advice about side effects. You may report side effects to FDA at 1-800-FDA-1088. Where should I keep my medicine? Keep out of the reach of children. Store at room temperature between 15 and 30 degrees C (59 and 86 degrees F). Protect from light. Throw away any unused medicine after the expiration date. NOTE: This sheet is a summary. It may not cover all possible information. If you have questions about  this medicine, talk to your doctor, pharmacist, or health care provider.  2020 [employer]/Gold Standard (2019-10-14 16:15:24)

## 2020-06-18 NOTE — Progress Notes (Signed)
Dr. Marin Olp has reviewed patient's labs today. Okay to treat.

## 2020-06-18 NOTE — Progress Notes (Signed)
Hematology and Oncology Follow Up Visit  Melvin [name] [id number] [birth year] 59 y.o. 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 He is still waiting on approval of Lumakras.  He is tolerating Gemzar and Abraxane well.  He has occasional twinges of discomfort in his left upper quadrant of the abdomen. This comes and goes.  He has some back pain at times as well. He states that his pain is managed effectively with the Duragesic patch and Hydrocodone for breakthrough episodes.  He is resting well at night.  No fever, chills, n/v, cough, rash, dizziness, SOB, chest pain, palpitations or changes in bowel or bladder habits.  The swelling in his legs is much improved. Minimal puffiness in the right leg. Pedal pulses are 2+.  He states that he is doing well on Lovenox and administering as prescribed.  No episodes of bleeding. No abnormal bruising , no petechiae.  No new or worsened tingling in his extremities.  No falls or syncopal episodes to report.  He states that he is eating well and staying hydrated. His weight is down 7 lbs at 228 lbs.  ECOG Performance Status: 1 - Symptomatic but completely ambulatory  Medications:  Allergies as of 06/18/2020   No Known Allergies     Medication List       Accurate as of June 18, 2020 12:06 PM. If you have any questions, ask your nurse or doctor.        atorvastatin 10 MG tablet Commonly known as: LIPITOR Take 1  tablet (10 mg total) by mouth daily.   Creon 24000-76000 units Cpep Generic drug: Pancrelipase (Lip-Prot-Amyl) TAKE 3 CAPSULE BY MOUTH IN THE MORNING, NOON, AND AT NIGHT AT BEDTIME What changed: See the new instructions.   dexamethasone 4 MG tablet Commonly known as: DECADRON Take 4 mg by mouth 2 (two) times daily with a meal. Takes 2 tabs PO daily, start day after chemo x 3 days with food.   dronabinol 5 MG capsule Commonly known as: MARINOL TAKE 1 CAPSULE BY MOUTH TWICE DAILY BEFORE LUNCH AND SUPPER   enoxaparin 120 MG/0.8ML injection Commonly known as: LOVENOX Inject 0.67 mLs (100 mg total) into the skin every 12 (twelve) hours.   fentaNYL 12 MCG/HR Commonly known as: DURAGESIC PLACE 1 PATCH ONTO THE SKIN EVERY 3 DAYS   fluconazole 100 MG tablet Commonly known as: DIFLUCAN Take 1 tablet (100 mg total) by mouth daily.   gabapentin 300 MG capsule Commonly known as: NEURONTIN Take 1 capsule (300 mg total) by mouth 3 (three) times daily.   HYDROcodone-acetaminophen 5-325 MG tablet Commonly known as: NORCO/VICODIN Take 1-2 tablets by mouth every 6 (six) hours as needed for moderate pain or severe pain.   HYDROcodone-homatropine 5-1.5 MG/5ML syrup Commonly known as: Hycodan Take 5 mLs by mouth every 6 (six) hours as needed for cough.   lidocaine-prilocaine cream Commonly known as: EMLA Apply 1 application topically as needed. Use on portacath as directed approx 1-2 hours prior  to chemotherapy   loperamide 2 MG capsule Commonly known as: Imodium A-D Take 1 capsule (2 mg total) by mouth as needed for diarrhea or loose stools. Take 2 tablets at onset of diarrhea, then 1 every 2 hours until 12 hours without a BM.   LORazepam 0.5 MG tablet Commonly known as: ATIVAN TAKE 1 TABLET BY MOUTH EVERY 8 HOURS AS NEEDED FOR NAUSEA   metFORMIN 1000 MG tablet Commonly known as: GLUCOPHAGE Take 1 tablet (1,000 mg total) by mouth 2 (two) times daily with a meal. What changed: when to  take this   metoCLOPramide 10 MG tablet Commonly known as: REGLAN Take 0.5 tablets (5 mg total) by mouth as needed for nausea (nausea/reflux).   metoprolol tartrate 25 MG tablet Commonly known as: LOPRESSOR Take 2 tablets (50 mg total) by mouth 2 (two) times daily.   NovoLOG FlexPen 100 UNIT/ML FlexPen Generic drug: insulin aspart Inject 15 Units into the skin 3 (three) times daily with meals.   ondansetron 8 MG disintegrating tablet Commonly known as: ZOFRAN-ODT Take 1 tablet (8 mg total) by mouth every 8 (eight) hours as needed for nausea.   ondansetron 8 MG tablet Commonly known as: ZOFRAN Take 1 tablet (8 mg total) by mouth 2 (two) times daily. As needed.  Start on Day 3 after chemotherapy.   Pen Needles 30G X 8 MM Misc 1 each by Does not apply route as directed.   prochlorperazine 10 MG tablet Commonly known as: COMPAZINE TAKE 1 TABLET BY MOUTH EVERY 6 HOURS AS NEEDED FOR NAUSEA & VOMITING       Allergies: No Known Allergies  Past Medical History, Surgical history, Social history, and Family History were reviewed and updated.  Review of Systems: All other 10 point review of systems is negative.   Physical Exam:  weight is 228 lb (103.4 kg). His oral temperature is 98 F (36.7 C). His blood pressure is 159/111 (abnormal) and his pulse is 76. His oxygen saturation is 100%.   Wt Readings from Last 3 Encounters:  06/18/20 228 lb (103.4 kg)  06/11/20 235 lb (106.6 kg)  06/03/20 236 lb 8.9 oz (107.3 kg)    Ocular: Sclerae unicteric, pupils equal, round and reactive to light Ear-nose-throat: Oropharynx clear, dentition fair Lymphatic: No cervical, supraclavicular or axillary adenopathy Lungs no rales or rhonchi, good excursion bilaterally Heart regular rate and rhythm, no murmur appreciated Abd soft, nontender, positive bowel sounds, no liver or spleen tip palpated on exam, no fluid wave  MSK no focal spinal tenderness, no joint edema Neuro: non-focal,  well-oriented, appropriate affect Breasts: Deferred   Lab Results  Component Value Date   WBC 6.8 06/18/2020   HGB 11.7 (L) 06/18/2020   HCT 37.8 (L) 06/18/2020   MCV 99.0 06/18/2020   PLT 214 06/18/2020   No results found for: FERRITIN, IRON, TIBC, UIBC, IRONPCTSAT Lab Results  Component Value Date   RBC 3.82 (L) 06/18/2020   No results found for: KPAFRELGTCHN, LAMBDASER, KAPLAMBRATIO No results found for: IGGSERUM, IGA, IGMSERUM No results found for: Ronnald Ramp, A1GS, A2GS, BETS, BETA2SER, GAMS, MSPIKE, SPEI   Chemistry      Component Value Date/Time   NA 139 06/11/2020 1020   K 3.5 06/11/2020 1020   CL 103 06/11/2020 1020   CO2 29 06/11/2020 1020   BUN 12 06/11/2020 1020   CREATININE 0.55 (L) 06/11/2020 1020   CREATININE 0.75 03/08/2020 1058      Component Value Date/Time   CALCIUM 8.3 (L) 06/11/2020  1020   ALKPHOS 509 (H) 06/11/2020 1020   AST 47 (H) 06/11/2020 1020   ALT 82 (H) 06/11/2020 1020   BILITOT 1.2 06/11/2020 1020       Impression and Plan: Ms. Trompeter is a very pleasant 59 yo caucasian gentleman with metastatic adenocarcinoma of the pancreas with extensive disease involving the liver, lungs and lymph nodes.  Approval for [facility] is still pending.  CA 19-9 3 weeks ago was [phone].  We will proceed with treatment with Gemzar and Abraxane today as planned.  We will see him again in another week.  They will contact our office with any questions or concerns. We can certainly see him sooner if needed.   Laverna Peace, NP 7/2/202112:06 PM

## 2020-06-18 NOTE — Patient Instructions (Signed)

## 2020-06-18 NOTE — Telephone Encounter (Signed)
Received a call from [facility] rep on 06/17/20 that [facility] application was still being processed and that a determination should be made within 24 hours.   [place] [facility] [place] Phone [phone] Fax [phone] 06/18/2020 8:47 AM

## 2020-06-19 LAB — CANCER ANTIGEN 19-9: CA 19-9: 99313 U/mL — ABNORMAL HIGH (ref 0–35)

## 2020-06-25 ENCOUNTER — Inpatient Hospital Stay: Payer: PRIVATE HEALTH INSURANCE

## 2020-06-25 ENCOUNTER — Telehealth: Payer: Self-pay | Admitting: Hematology & Oncology

## 2020-06-25 ENCOUNTER — Telehealth: Payer: Self-pay | Admitting: *Deleted

## 2020-06-25 ENCOUNTER — Inpatient Hospital Stay (HOSPITAL_BASED_OUTPATIENT_CLINIC_OR_DEPARTMENT_OTHER): Payer: PRIVATE HEALTH INSURANCE | Admitting: Hematology & Oncology

## 2020-06-25 ENCOUNTER — Other Ambulatory Visit: Payer: Self-pay

## 2020-06-25 ENCOUNTER — Encounter: Payer: Self-pay | Admitting: Hematology & Oncology

## 2020-06-25 VITALS — BP 143/99 | HR 85 | Temp 98.0°F | Resp 16

## 2020-06-25 DIAGNOSIS — C787 Secondary malignant neoplasm of liver and intrahepatic bile duct: Secondary | ICD-10-CM

## 2020-06-25 DIAGNOSIS — C259 Malignant neoplasm of pancreas, unspecified: Secondary | ICD-10-CM

## 2020-06-25 DIAGNOSIS — Z5111 Encounter for antineoplastic chemotherapy: Secondary | ICD-10-CM | POA: Diagnosis not present

## 2020-06-25 LAB — CBC WITH DIFFERENTIAL (CANCER CENTER ONLY)
Abs Immature Granulocytes: 0.53 10*3/uL — ABNORMAL HIGH (ref 0.00–0.07)
Basophils Absolute: 0 10*3/uL (ref 0.0–0.1)
Basophils Relative: 0 %
Eosinophils Absolute: 0 10*3/uL (ref 0.0–0.5)
Eosinophils Relative: 0 %
HCT: 35.8 % — ABNORMAL LOW (ref 39.0–52.0)
Hemoglobin: 11.4 g/dL — ABNORMAL LOW (ref 13.0–17.0)
Immature Granulocytes: 8 %
Lymphocytes Relative: 13 %
Lymphs Abs: 0.9 10*3/uL (ref 0.7–4.0)
MCH: 30.8 pg (ref 26.0–34.0)
MCHC: 31.8 g/dL (ref 30.0–36.0)
MCV: 96.8 fL (ref 80.0–100.0)
Monocytes Absolute: 0.4 10*3/uL (ref 0.1–1.0)
Monocytes Relative: 6 %
Neutro Abs: 5 10*3/uL (ref 1.7–7.7)
Neutrophils Relative %: 73 %
Platelet Count: 120 10*3/uL — ABNORMAL LOW (ref 150–400)
RBC: 3.7 MIL/uL — ABNORMAL LOW (ref 4.22–5.81)
RDW: 17.9 % — ABNORMAL HIGH (ref 11.5–15.5)
WBC Count: 7 10*3/uL (ref 4.0–10.5)
nRBC: 1 % — ABNORMAL HIGH (ref 0.0–0.2)

## 2020-06-25 LAB — CMP (CANCER CENTER ONLY)
ALT: 100 U/L — ABNORMAL HIGH (ref 0–44)
AST: 97 U/L — ABNORMAL HIGH (ref 15–41)
Albumin: 2.9 g/dL — ABNORMAL LOW (ref 3.5–5.0)
Alkaline Phosphatase: 684 U/L — ABNORMAL HIGH (ref 38–126)
Anion gap: 9 (ref 5–15)
BUN: 10 mg/dL (ref 6–20)
CO2: 30 mmol/L (ref 22–32)
Calcium: 8.5 mg/dL — ABNORMAL LOW (ref 8.9–10.3)
Chloride: 100 mmol/L (ref 98–111)
Creatinine: 0.47 mg/dL — ABNORMAL LOW (ref 0.61–1.24)
GFR, Est AFR Am: >60 mL/min (ref >60)
GFR, Estimated: >60 mL/min (ref >60)
Glucose, Bld: 207 mg/dL — ABNORMAL HIGH (ref 70–99)
Potassium: 3.2 mmol/L — ABNORMAL LOW (ref 3.5–5.1)
Sodium: 139 mmol/L (ref 135–145)
Total Bilirubin: 1.9 mg/dL — ABNORMAL HIGH (ref 0.3–1.2)
Total Protein: 5.6 g/dL — ABNORMAL LOW (ref 6.5–8.1)

## 2020-06-25 LAB — LACTATE DEHYDROGENASE: LDH: 342 U/L — ABNORMAL HIGH (ref 98–192)

## 2020-06-25 MED ORDER — SODIUM CHLORIDE 0.9 % IV SOLN
Freq: Once | INTRAVENOUS | Status: AC
  Filled 2020-06-25: qty 250

## 2020-06-25 NOTE — Telephone Encounter (Signed)
I called and left a message on patient's cell phone. Per Dr. Marin Olp, he is to take the new medication daily with lunch. Instructed him to call the office so, I know he got this message.

## 2020-06-25 NOTE — Telephone Encounter (Signed)
Appointments scheduled calendar printed &  Mailed per 7/9 los

## 2020-06-25 NOTE — Patient Instructions (Signed)
Implanted Port Insertion, Care After °This sheet gives you information about how to care for yourself after your procedure. Your health care provider may also give you more specific instructions. If you have problems or questions, contact your health care provider. °What can I expect after the procedure? °After the procedure, it is common to have: °· Discomfort at the port insertion site. °· Bruising on the skin over the port. This should improve over 3-4 days. °Follow these instructions at home: °Port care °· After your port is placed, you will get a manufacturer's information card. The card has information about your port. Keep this card with you at all times. °· Take care of the port as told by your health care provider. Ask your health care provider if you or a family member can get training for taking care of the port at home. A home health care nurse may also take care of the port. °· Make sure to remember what type of port you have. °Incision care ° °  ° °· Follow instructions from your health care provider about how to take care of your port insertion site. Make sure you: °? Wash your hands with soap and water before and after you change your bandage (dressing). If soap and water are not available, use hand sanitizer. °? Change your dressing as told by your health care provider. °? Leave stitches (sutures), skin glue, or adhesive strips in place. These skin closures may need to stay in place for 2 weeks or longer. If adhesive strip edges start to loosen and curl up, you may trim the loose edges. Do not remove adhesive strips completely unless your health care provider tells you to do that. °· Check your port insertion site every day for signs of infection. Check for: °? Redness, swelling, or pain. °? Fluid or blood. °? Warmth. °? Pus or a bad smell. °Activity °· Return to your normal activities as told by your health care provider. Ask your health care provider what activities are safe for you. °· Do not  lift anything that is heavier than 10 lb (4.5 kg), or the limit that you are told, until your health care provider says that it is safe. °General instructions °· Take over-the-counter and prescription medicines only as told by your health care provider. °· Do not take baths, swim, or use a hot tub until your health care provider approves. Ask your health care provider if you may take showers. You may only be allowed to take sponge baths. °· Do not drive for 24 hours if you were given a sedative during your procedure. °· Wear a medical alert bracelet in case of an emergency. This will tell any health care providers that you have a port. °· Keep all follow-up visits as told by your health care provider. This is important. °Contact a health care provider if: °· You cannot flush your port with saline as directed, or you cannot draw blood from the port. °· You have a fever or chills. °· You have redness, swelling, or pain around your port insertion site. °· You have fluid or blood coming from your port insertion site. °· Your port insertion site feels warm to the touch. °· You have pus or a bad smell coming from the port insertion site. °Get help right away if: °· You have chest pain or shortness of breath. °· You have bleeding from your port that you cannot control. °Summary °· Take care of the port as told by your health   care provider. Keep the manufacturer's information card with you at all times. °· Change your dressing as told by your health care provider. °· Contact a health care provider if you have a fever or chills or if you have redness, swelling, or pain around your port insertion site. °· Keep all follow-up visits as told by your health care provider. °This information is not intended to replace advice given to you by your health care provider. Make sure you discuss any questions you have with your health care provider. °Document Revised: 07/02/2018 Document Reviewed: 07/02/2018 °Elsevier Patient Education ©  2020 Elsevier Inc. ° °

## 2020-06-25 NOTE — Progress Notes (Signed)
Hematology and Oncology Follow Up Visit  Delante Karapetyan [id number] 28-Dec-1960 59 y.o. 06/25/2020   Principle Diagnosis:  Metastatic adenocarcinoma of the pancreas-liver, lung and lymph node metastasis - KRAS (+) Pulmonary embolism -- dx on 04/20/2020 RIGHT leg DVT -- femoral - peroneal -- dx 05/28/2020  Past Therapy: Status post cycle 2 of FOLFIRINOX -- d/c on 04/20/2020  Current Therapy:        Gemzar/Abraxane -- started 04/30/2020, s/p cycle 1 - d/c on 06/25/2020 Xarelto 20 mg po q day -- started 04/20/2020 -- d/c on 05/28/2020 Lovenox 100 mg SQ BID -- start on 05/28/2020 Lumakras 100 mg po q day -- start on 06/26/2020   Interim History:  Mr. Hemmer is here today for follow-up unfortunately, he still not making much progress.  He is just is very weak.  He does not have a lot of energy.  He cannot stand to have his weight taken today.  His right leg does look a lot better.  He had the thrombus in the right leg.  He was started on Lovenox.  He is doing well with the Lovenox.  By a miracle of got, we were able to get the [facility] for him.  He has the K-ras mutation with his tumor.  Even though this is not metastatic lung cancer, I thought that the [facility] would be a great way to try to help him and try to get him stronger if it works.  The CA 19-9 has improved a little bit.  And now is 99,000.  He still has some shortness of breath.  He has a little bit of a cough.  It is nonproductive.  There is no bleeding.  He has had no diarrhea.  He has had some back and side pain.  He keeps waking up at nighttime.  He has had no problems with mouth sores.  He has had an occasional headache.  I would have to say that right now, his performance status is ECOG 2-3.      Medications:  Allergies as of 06/25/2020   No Known Allergies     Medication List       Accurate as of June 25, 2020 10:52 AM. If you have any questions, ask your nurse or doctor.        atorvastatin 10 MG  tablet Commonly known as: LIPITOR Take 1 tablet (10 mg total) by mouth daily.   Creon 24000-76000 units Cpep Generic drug: Pancrelipase (Lip-Prot-Amyl) TAKE 3 CAPSULE BY MOUTH IN THE MORNING, NOON, AND AT NIGHT AT BEDTIME What changed: See the new instructions.   dexamethasone 4 MG tablet Commonly known as: DECADRON TAKE 2 TABLETS(8 MG) BY MOUTH DAILY. START THE DAY AFTER CHEMOTHERAPY FOR 3 DAYS. TAKE WITH FOOD   dronabinol 5 MG capsule Commonly known as: MARINOL TAKE 1 CAPSULE BY MOUTH TWICE DAILY BEFORE LUNCH AND SUPPER   enoxaparin 120 MG/0.8ML injection Commonly known as: LOVENOX Inject 0.67 mLs (100 mg total) into the skin every 12 (twelve) hours.   fentaNYL 12 MCG/HR Commonly known as: DURAGESIC PLACE 1 PATCH ONTO THE SKIN EVERY 3 DAYS   fluconazole 100 MG tablet Commonly known as: DIFLUCAN Take 1 tablet (100 mg total) by mouth daily.   gabapentin 300 MG capsule Commonly known as: NEURONTIN Take 1 capsule (300 mg total) by mouth 3 (three) times daily.   HYDROcodone-acetaminophen 5-325 MG tablet Commonly known as: NORCO/VICODIN Take 1-2 tablets by mouth every 6 (six) hours as needed for moderate pain or severe pain.  HYDROcodone-homatropine 5-1.5 MG/5ML syrup Commonly known as: Hycodan Take 5 mLs by mouth every 6 (six) hours as needed for cough.   lidocaine-prilocaine cream Commonly known as: EMLA Apply 1 application topically as needed. Use on portacath as directed approx 1-2 hours prior to chemotherapy   loperamide 2 MG capsule Commonly known as: Imodium A-D Take 1 capsule (2 mg total) by mouth as needed for diarrhea or loose stools. Take 2 tablets at onset of diarrhea, then 1 every 2 hours until 12 hours without a BM.   LORazepam 0.5 MG tablet Commonly known as: ATIVAN Take 1 tablet (0.5 mg total) by mouth every 8 (eight) hours as needed. for nausea   metFORMIN 1000 MG tablet Commonly known as: GLUCOPHAGE Take 1 tablet (1,000 mg total) by mouth 2 (two)  times daily with a meal. What changed: when to take this   metoCLOPramide 10 MG tablet Commonly known as: REGLAN Take 0.5 tablets (5 mg total) by mouth as needed for nausea (nausea/reflux).   metoprolol tartrate 25 MG tablet Commonly known as: LOPRESSOR Take 2 tablets (50 mg total) by mouth 2 (two) times daily.   NovoLOG FlexPen 100 UNIT/ML FlexPen Generic drug: insulin aspart Inject 15 Units into the skin 3 (three) times daily with meals.   ondansetron 8 MG disintegrating tablet Commonly known as: ZOFRAN-ODT Take 1 tablet (8 mg total) by mouth every 8 (eight) hours as needed for nausea.   ondansetron 8 MG tablet Commonly known as: ZOFRAN Take 1 tablet (8 mg total) by mouth 2 (two) times daily. As needed.  Start on Day 3 after chemotherapy.   Pen Needles 30G X 8 MM Misc 1 each by Does not apply route as directed.   prochlorperazine 10 MG tablet Commonly known as: COMPAZINE TAKE 1 TABLET BY MOUTH EVERY 6 HOURS AS NEEDED FOR NAUSEA & VOMITING       Allergies: No Known Allergies  Past Medical History, Surgical history, Social history, and Family History were reviewed and updated.  Review of Systems: Review of Systems  Constitutional: Positive for malaise/fatigue.  HENT: Negative.   Eyes: Negative.   Respiratory: Positive for cough.   Cardiovascular: Positive for leg swelling.  Gastrointestinal: Positive for diarrhea and vomiting.  Genitourinary: Negative.   Musculoskeletal: Positive for myalgias.  Neurological: Positive for tingling.  Endo/Heme/Allergies: Negative.   Psychiatric/Behavioral: Negative.      Physical Exam:  oral temperature is 98 F (36.7 C). His blood pressure is 143/99 (abnormal) and his pulse is 85. His respiration is 16 and oxygen saturation is 97%.   Wt Readings from Last 3 Encounters:  06/18/20 228 lb (103.4 kg)  06/11/20 235 lb (106.6 kg)  06/03/20 236 lb 8.9 oz (107.3 kg)    Physical Exam Vitals reviewed.  HENT:     Head:  Normocephalic and atraumatic.  Eyes:     Pupils: Pupils are equal, round, and reactive to light.  Cardiovascular:     Rate and Rhythm: Normal rate and regular rhythm.     Heart sounds: Normal heart sounds.  Pulmonary:     Effort: Pulmonary effort is normal.     Breath sounds: Normal breath sounds.  Abdominal:     General: Bowel sounds are normal.     Palpations: Abdomen is soft.  Musculoskeletal:        General: No tenderness or deformity. Normal range of motion.     Cervical back: Normal range of motion.  Lymphadenopathy:     Cervical: No cervical adenopathy.  Skin:  General: Skin is warm and dry.     Findings: No erythema or rash.  Neurological:     Mental Status: He is alert and oriented to person, place, and time.  Psychiatric:        Behavior: Behavior normal.        Thought Content: Thought content normal.        Judgment: Judgment normal.    .  Lab Results  Component Value Date   WBC 7.0 06/25/2020   HGB 11.4 (L) 06/25/2020   HCT 35.8 (L) 06/25/2020   MCV 96.8 06/25/2020   PLT 120 (L) 06/25/2020   No results found for: FERRITIN, IRON, TIBC, UIBC, IRONPCTSAT Lab Results  Component Value Date   RBC 3.70 (L) 06/25/2020   No results found for: KPAFRELGTCHN, LAMBDASER, KAPLAMBRATIO No results found for: IGGSERUM, IGA, IGMSERUM No results found for: Odetta Pink, SPEI   Chemistry      Component Value Date/Time   NA 139 06/25/2020 0920   K 3.2 (L) 06/25/2020 0920   CL 100 06/25/2020 0920   CO2 30 06/25/2020 0920   BUN 10 06/25/2020 0920   CREATININE 0.47 (L) 06/25/2020 0920   CREATININE 0.75 03/08/2020 1058      Component Value Date/Time   CALCIUM 8.5 (L) 06/25/2020 0920   ALKPHOS 684 (H) 06/25/2020 0920   AST 97 (H) 06/25/2020 0920   ALT 100 (H) 06/25/2020 0920   BILITOT 1.9 (H) 06/25/2020 0920       Impression and Plan: Ms. Weisenberger is a very pleasant 58 yo caucasian gentleman with metastatic  adenocarcinoma of the pancreas with extensive disease involving the liver, lungs and lymph nodes.   Just seems like it is apparent that he is not going to tolerate the Gemzar/Abraxane.  His performance status is not good enough.  His liver function studies are elevated.  Thankfully, we got the Lumakras.  This was truly a miracle.  Hopefully it will work.  I think we will know if it is going to work by his performance status and by the CA 19-9.  We will hold on his chemotherapy for now.  We will plan to get him back in about 2-3 weeks.  We should hopefully have a better idea at that time if everything is working.  Spent about 40-45 minutes with him today.  I talked about the side effects of the new medication.  We talked about what to expect.  I think if the Lumakras does not work, then I think that we really need to think about doing comfort measures.    Volanda Napoleon, MD 7/9/202110:52 AM

## 2020-06-25 NOTE — Patient Instructions (Signed)

## 2020-06-28 ENCOUNTER — Encounter: Payer: Self-pay | Admitting: *Deleted

## 2020-06-28 NOTE — Progress Notes (Signed)
Oncology Nurse Navigator Documentation  Oncology Nurse Navigator Flowsheets 06/28/2020  Abnormal Finding Date -  Diagnosis Status -  Planned Course of Treatment -  Phase of Treatment -  Chemotherapy Actual Start Date: -  Navigator Follow Up Date: 07/14/2020  Navigator Follow Up Reason: Follow-up Appointment  Navigator Location [facility]  Referral Date to RadOnc/MedOnc -  Navigator Encounter Type Appt/Treatment Plan Review  Telephone -  Treatment Initiated Date -  Patient Visit Type MedOnc  Treatment Phase Active Tx  Barriers/Navigation Needs -  Education -  Interventions None Required  Acuity Level 3-Moderate Needs (3-4 Barriers Identified)  Referrals -  Coordination of Care -  Education Method -  Support Groups/Services Friends and Family  Time Spent with Patient 15

## 2020-07-06 ENCOUNTER — Other Ambulatory Visit: Payer: Self-pay | Admitting: Hematology & Oncology

## 2020-07-06 ENCOUNTER — Other Ambulatory Visit: Payer: Self-pay | Admitting: *Deleted

## 2020-07-06 ENCOUNTER — Other Ambulatory Visit: Payer: Self-pay | Admitting: Family

## 2020-07-06 MED ORDER — UNABLE TO FIND: 720.0000 mg | Freq: Every day | 0 refills | Status: DC

## 2020-07-06 MED FILL — LORazepam 0.5 MG TABS: 0.5 | 20 days supply | Qty: 60 | Fill #0

## 2020-07-06 MED FILL — FLUCONAZOLE 100 MG TABLET: 100 | 21 days supply | Qty: 21 | Fill #3

## 2020-07-06 MED FILL — FENTANYL 12 MCG/HR PT72: 12 | 30 days supply | Qty: 10 | Fill #0

## 2020-07-06 MED FILL — CREON DR 24,000 UNITS CAP: 24000-76000 | 10 days supply | Qty: 90 | Fill #0

## 2020-07-07 ENCOUNTER — Other Ambulatory Visit: Payer: Self-pay | Admitting: Osteopathic Medicine

## 2020-07-07 MED ORDER — METOPROLOL TARTRATE 50 MG PO TABS: 50.0000 mg | ORAL_TABLET | Freq: Two times a day (BID) | ORAL | 1 refills | Status: AC

## 2020-07-07 MED ORDER — METOPROLOL TARTRATE 50 MG PO TABS: 50.0000 mg | ORAL_TABLET | Freq: Two times a day (BID) | ORAL | 1 refills | Status: DC

## 2020-07-12 ENCOUNTER — Other Ambulatory Visit: Payer: Self-pay | Admitting: Family

## 2020-07-12 ENCOUNTER — Ambulatory Visit (HOSPITAL_BASED_OUTPATIENT_CLINIC_OR_DEPARTMENT_OTHER)
Admission: RE | Admit: 2020-07-12 | Discharge: 2020-07-12 | Disposition: A | Discharge status: Home / Self Care | Payer: PRIVATE HEALTH INSURANCE | Source: Ambulatory Visit | Attending: Family | Admitting: Family

## 2020-07-12 ENCOUNTER — Telehealth: Payer: Self-pay | Admitting: *Deleted

## 2020-07-12 ENCOUNTER — Other Ambulatory Visit: Payer: Self-pay | Admitting: Hematology & Oncology

## 2020-07-12 ENCOUNTER — Other Ambulatory Visit: Payer: Self-pay | Admitting: *Deleted

## 2020-07-12 ENCOUNTER — Other Ambulatory Visit: Payer: Self-pay

## 2020-07-12 DIAGNOSIS — I82411 Acute embolism and thrombosis of right femoral vein: Secondary | ICD-10-CM | POA: Diagnosis present

## 2020-07-12 DIAGNOSIS — C787 Secondary malignant neoplasm of liver and intrahepatic bile duct: Secondary | ICD-10-CM | POA: Diagnosis present

## 2020-07-12 DIAGNOSIS — C259 Malignant neoplasm of pancreas, unspecified: Secondary | ICD-10-CM

## 2020-07-12 DIAGNOSIS — C799 Secondary malignant neoplasm of unspecified site: Secondary | ICD-10-CM

## 2020-07-12 IMAGING — US US EXTREM LOW VENOUS*R*
1 series · 13 of 24 positions shown · non-contrast
Comparison: None.

CLINICAL DATA: Follow-up DVT of the right lower extremity involving
the femoral, popliteal posterior tibial, peroneal and gastrocnemius
veins. History of pancreatic carcinoma.



[Series 1: us extrem low venous*right* · 13 of 49 slices shown]
[im 1/49]
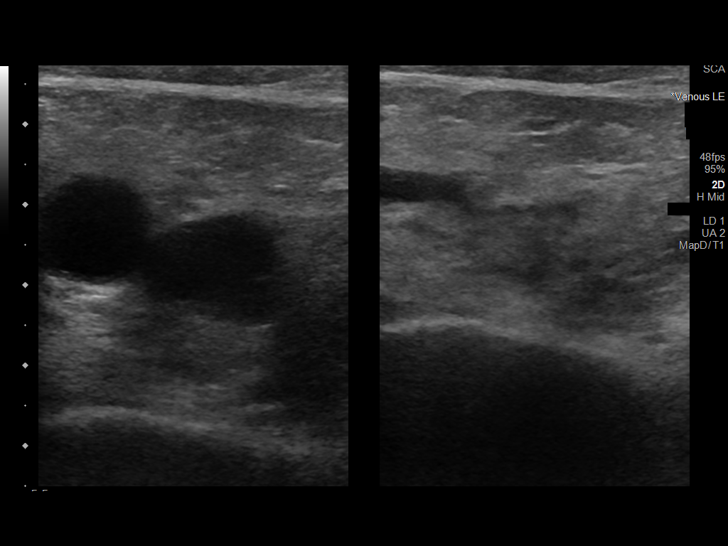
[im 5/49]
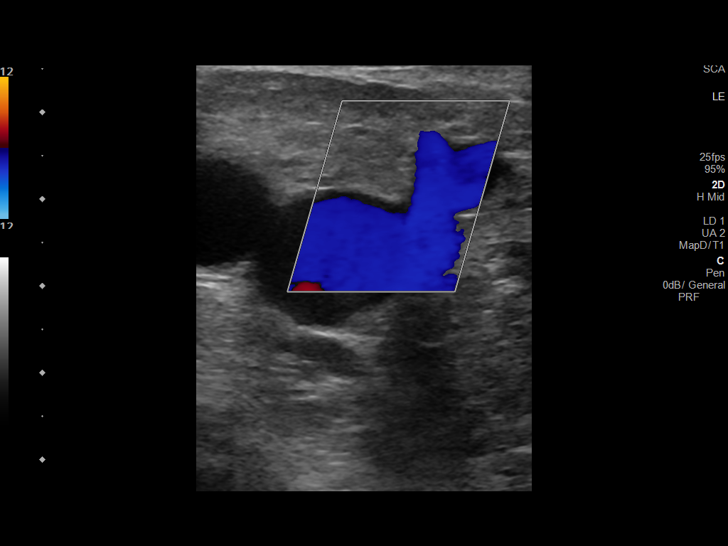
[im 9/49]
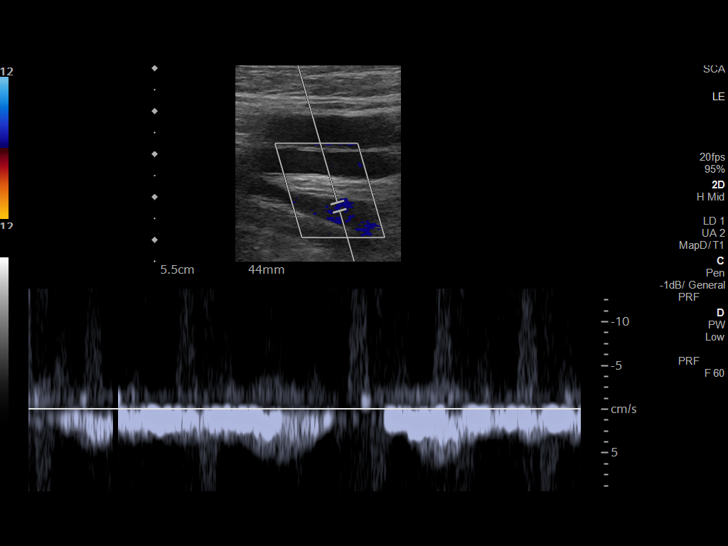
[im 13/49]
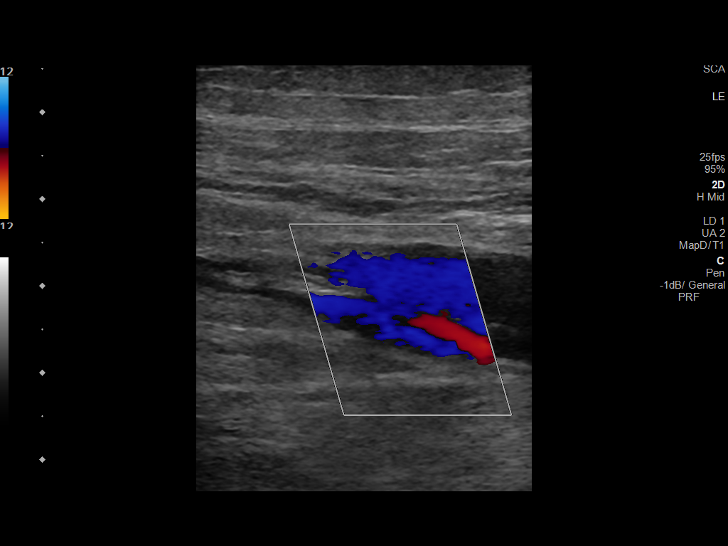
[im 17/49]
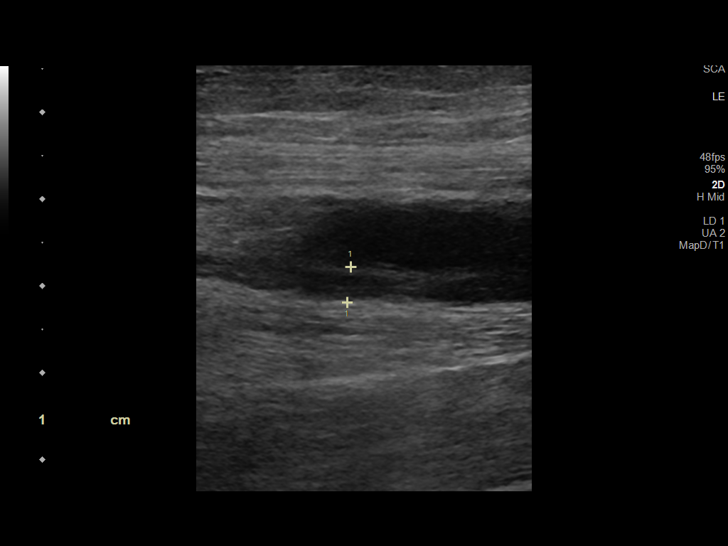
[im 21/49]
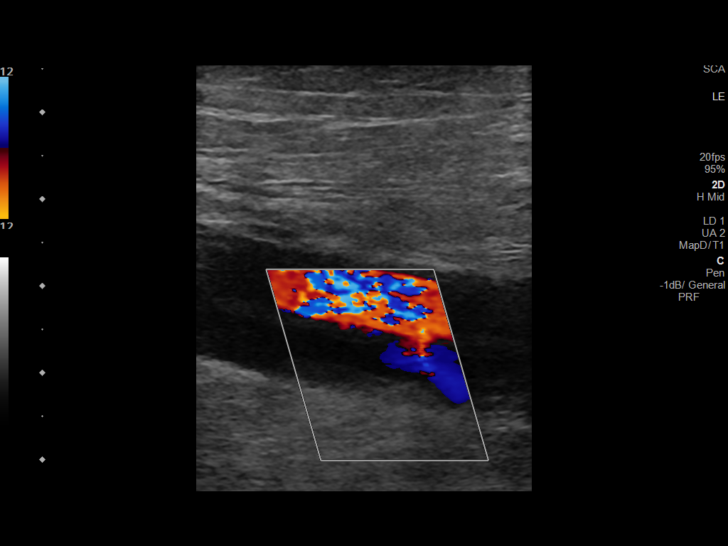
[im 26/49]
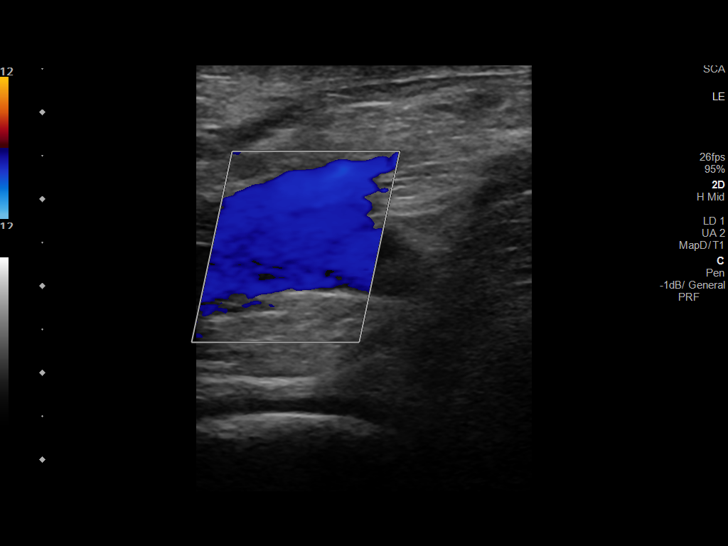
[im 28/49]
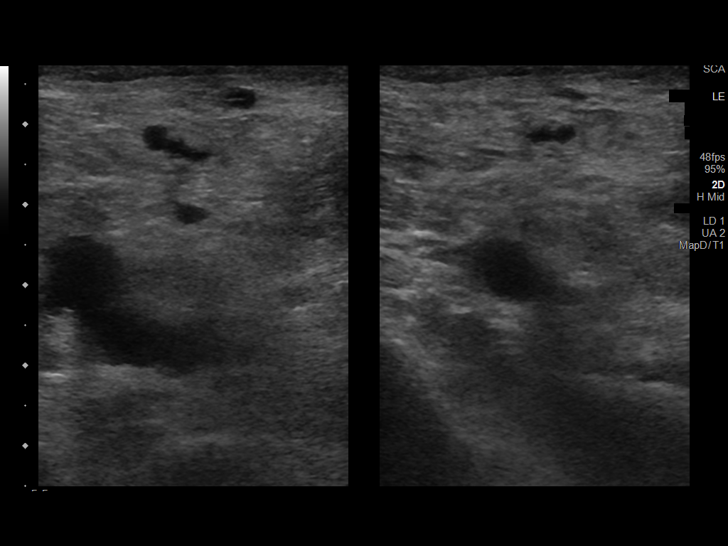
[im 32/49]
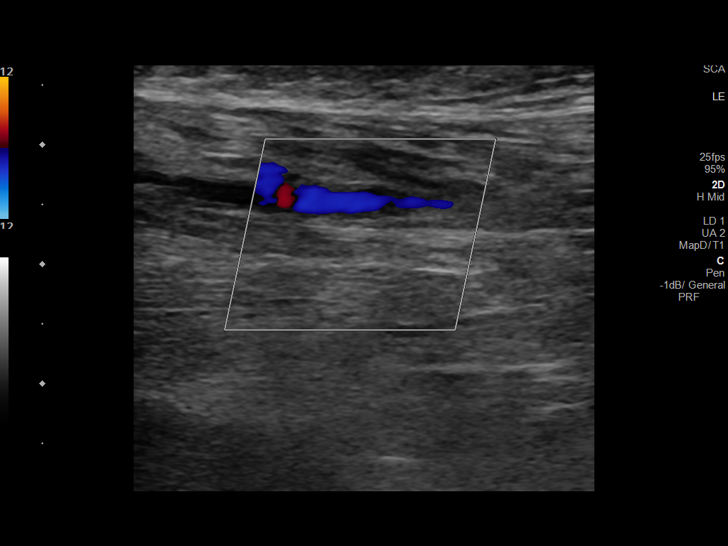
[im 36/49]
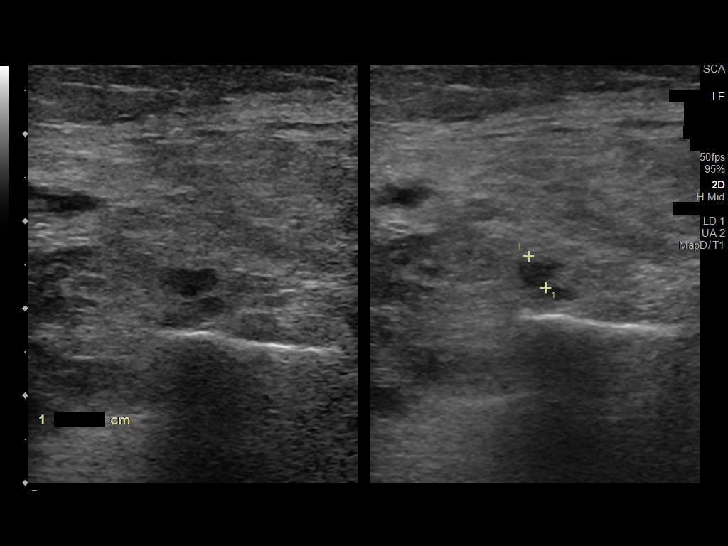
[im 40/49]
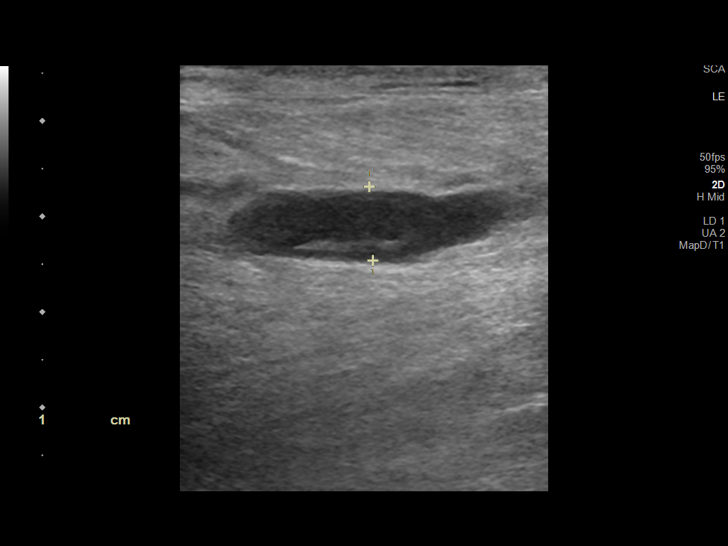
[im 44/49]
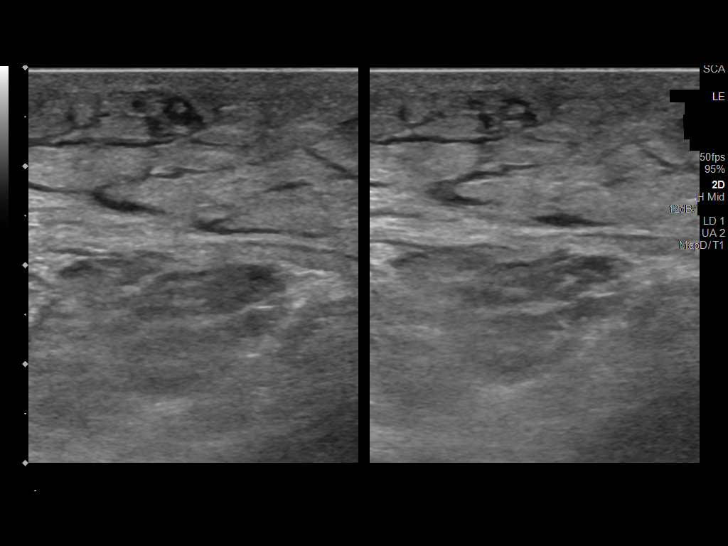
[im 49/49]
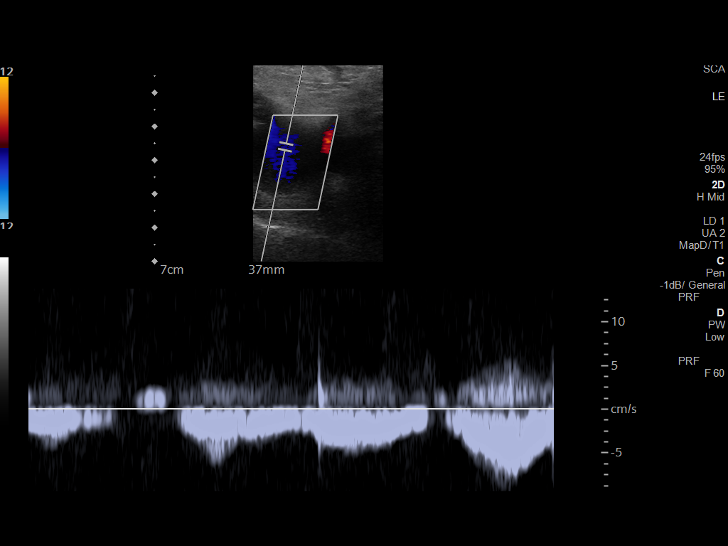

[13 of 24 positions shown; findings below may reference images not displayed]

FINDINGS: Contralateral Common Femoral Vein: Respiratory phasicity is normal
and symmetric with the symptomatic side. No evidence of thrombus.
Normal compressibility.

Common Femoral Vein: No evidence of thrombus. Normal
compressibility, respiratory phasicity and response to augmentation.

Saphenofemoral Junction: No evidence of thrombus. Normal
compressibility and flow on color Doppler imaging.

Profunda Femoral Vein: No evidence of thrombus. Normal
compressibility and flow on color Doppler imaging.

Femoral Vein: Improved patency of the femoral vein with nonocclusive
mural thrombus remaining.

Popliteal Vein: Improved patency of the popliteal vein with flow now
present. There is some nonocclusive thrombus in the popliteal vein.

Calf Veins: Some residual thrombus remains in calf veins including
the gastrocnemius vein. There may also be thrombus in at least one
dilated superficial vein that may represent a superficial
varicosity.

Superficial Great Saphenous Vein: No evidence of thrombus. Normal
compressibility.

Venous Reflux:  None.

Other Findings:  No abnormal fluid collections.
IMPRESSION: Overall improvement in appearance of right lower extremity DVT with
partial recanalization of the right femoral and popliteal veins
which contain some nonocclusive thrombus compared to occlusive
thrombus on the prior study. There remains component of thrombus in
tibial veins and gastrocnemius vein below the knee. At least 1
dilated superficial vein also present that may represent a
thrombosed varicosity.

## 2020-07-12 MED ORDER — HYDROCODONE-ACETAMINOPHEN 5-325 MG PO TABS: 1.0000 | ORAL_TABLET | Freq: Four times a day (QID) | ORAL | 0 refills | Status: DC | PRN

## 2020-07-12 MED FILL — DRONABINOL 5 MG CAPSULE: 5 | 30 days supply | Qty: 60 | Fill #0

## 2020-07-12 MED FILL — HYDROCODON-APAP 5-325: 5-325 | 15 days supply | Qty: 120 | Fill #0

## 2020-07-12 NOTE — Telephone Encounter (Signed)
Message received from patient's wife requesting refill for Marinol, Hydrocodone and Lumakras.  Call placed back to patient's wife to inform her that refills have been sent for Marinol and Hydrocodone to the [facility], that refill for Lumakras has been called in to [facility] and they are processing that refill request.  Pt states during this phone call that he is having a new pain behind his right knee and right upper leg, has intermittent sharp pain to his chest for the last week and denies any SOB.  Jory Ee NP notified.  Call placed back to patient and patient informed that an order will be placed for a doppler of right leg today per S. [place] NP. Pt appreciative of call back and has no other questions at this time.  Order for doppler placed per S. [place] NP.

## 2020-07-12 NOTE — Telephone Encounter (Signed)
-----   Message from Eliezer Bottom, NP sent at 07/12/2020  4:44 PM EDT ----- Looking better! ----- Message ----- From: Interface, Rad Results In Sent: 07/12/2020   4:19 PM EDT To: Eliezer Bottom, NP

## 2020-07-12 NOTE — Telephone Encounter (Signed)
As noted below by Laverna Peace, NP, I informed the wife that the doppler is looking better. She verbalized understanding.

## 2020-07-13 ENCOUNTER — Other Ambulatory Visit (HOSPITAL_BASED_OUTPATIENT_CLINIC_OR_DEPARTMENT_OTHER): Payer: PRIVATE HEALTH INSURANCE

## 2020-07-14 ENCOUNTER — Encounter: Payer: Self-pay | Admitting: *Deleted

## 2020-07-14 ENCOUNTER — Inpatient Hospital Stay: Payer: PRIVATE HEALTH INSURANCE

## 2020-07-14 ENCOUNTER — Inpatient Hospital Stay (HOSPITAL_BASED_OUTPATIENT_CLINIC_OR_DEPARTMENT_OTHER): Payer: PRIVATE HEALTH INSURANCE | Admitting: Hematology & Oncology

## 2020-07-14 ENCOUNTER — Encounter: Payer: Self-pay | Admitting: Hematology & Oncology

## 2020-07-14 ENCOUNTER — Other Ambulatory Visit: Payer: Self-pay

## 2020-07-14 VITALS — BP 110/72 | HR 96 | Temp 98.8°F | Resp 18 | Ht 75.0 in

## 2020-07-14 DIAGNOSIS — Z5111 Encounter for antineoplastic chemotherapy: Secondary | ICD-10-CM | POA: Diagnosis not present

## 2020-07-14 DIAGNOSIS — C787 Secondary malignant neoplasm of liver and intrahepatic bile duct: Secondary | ICD-10-CM | POA: Diagnosis not present

## 2020-07-14 DIAGNOSIS — Z95828 Presence of other vascular implants and grafts: Secondary | ICD-10-CM

## 2020-07-14 DIAGNOSIS — C259 Malignant neoplasm of pancreas, unspecified: Secondary | ICD-10-CM | POA: Diagnosis not present

## 2020-07-14 LAB — CMP (CANCER CENTER ONLY)
ALT: 51 U/L — ABNORMAL HIGH (ref 0–44)
AST: 74 U/L — ABNORMAL HIGH (ref 15–41)
Albumin: 2.9 g/dL — ABNORMAL LOW (ref 3.5–5.0)
Alkaline Phosphatase: 1028 U/L — ABNORMAL HIGH (ref 38–126)
Anion gap: 10 (ref 5–15)
BUN: 10 mg/dL (ref 6–20)
CO2: 28 mmol/L (ref 22–32)
Calcium: 8.5 mg/dL — ABNORMAL LOW (ref 8.9–10.3)
Chloride: 104 mmol/L (ref 98–111)
Creatinine: 0.53 mg/dL — ABNORMAL LOW (ref 0.61–1.24)
GFR, Est AFR Am: >60 mL/min (ref >60)
GFR, Estimated: >60 mL/min (ref >60)
Glucose, Bld: 55 mg/dL — ABNORMAL LOW (ref 70–99)
Potassium: 3 mmol/L — CL (ref 3.5–5.1)
Sodium: 142 mmol/L (ref 135–145)
Total Bilirubin: 6.6 mg/dL (ref 0.3–1.2)
Total Protein: 5.6 g/dL — ABNORMAL LOW (ref 6.5–8.1)

## 2020-07-14 LAB — CBC WITH DIFFERENTIAL (CANCER CENTER ONLY)
Abs Immature Granulocytes: 0.08 10*3/uL — ABNORMAL HIGH (ref 0.00–0.07)
Basophils Absolute: 0 10*3/uL (ref 0.0–0.1)
Basophils Relative: 0 %
Eosinophils Absolute: 0 10*3/uL (ref 0.0–0.5)
Eosinophils Relative: 0 %
HCT: 34.6 % — ABNORMAL LOW (ref 39.0–52.0)
Hemoglobin: 10.9 g/dL — ABNORMAL LOW (ref 13.0–17.0)
Immature Granulocytes: 1 %
Lymphocytes Relative: 10 %
Lymphs Abs: 1.2 10*3/uL (ref 0.7–4.0)
MCH: 31.1 pg (ref 26.0–34.0)
MCHC: 31.5 g/dL (ref 30.0–36.0)
MCV: 98.9 fL (ref 80.0–100.0)
Monocytes Absolute: 1.1 10*3/uL — ABNORMAL HIGH (ref 0.1–1.0)
Monocytes Relative: 10 %
Neutro Abs: 9 10*3/uL — ABNORMAL HIGH (ref 1.7–7.7)
Neutrophils Relative %: 79 %
Platelet Count: 275 10*3/uL (ref 150–400)
RBC: 3.5 MIL/uL — ABNORMAL LOW (ref 4.22–5.81)
RDW: 19 % — ABNORMAL HIGH (ref 11.5–15.5)
WBC Count: 11.5 10*3/uL — ABNORMAL HIGH (ref 4.0–10.5)
nRBC: 0 % (ref 0.0–0.2)

## 2020-07-14 MED ORDER — HEPARIN SOD (PORK) LOCK FLUSH 100 UNIT/ML IV SOLN
500.0000 [IU] | Freq: Once | INTRAVENOUS | Status: AC
  Administered 2020-07-14: 500 [IU] via INTRAVENOUS
  Filled 2020-07-14: qty 5

## 2020-07-14 MED ORDER — SODIUM CHLORIDE 0.9% FLUSH
10.0000 mL | Freq: Once | INTRAVENOUS | Status: AC
  Administered 2020-07-14: 10 mL via INTRAVENOUS
  Filled 2020-07-14: qty 10

## 2020-07-14 MED ORDER — FENTANYL 25 MCG/HR TD PT72: 1.0000 | MEDICATED_PATCH | TRANSDERMAL | 0 refills | Status: AC

## 2020-07-14 MED FILL — FENTANYL 25 MCG/HR PT72: 25 | 30 days supply | Qty: 15 | Fill #0

## 2020-07-14 NOTE — Patient Instructions (Signed)

## 2020-07-14 NOTE — Progress Notes (Signed)
Hematology and Oncology Follow Up Visit  Melvin [name] [id number] [birth year] 59 y.o. 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 Moroney is here today for follow-up.  He comes in with a daughter.  He actually looks the best have seen him looking quite a while.  He sounds more healthy.  He sounds stronger.  He looks stronger.  However, I cannot help but no that he had a little bit of yellow with the sclera.  I looked at his lab work.  His bilirubin is now 6.6.  Also troublesome is the fact that his blood sugar was down to 55.  He has never had low blood sugars.  I have to really worry about his liver.  It is possible that the [place] might be causing this.  There is about a 25% incidence of abnormal liver function studies.  We will get an emergent ultrasound of his liver.  So far all things had problems with metastasis to his liver.  If the liver looks okay on ultrasound, we will have him stop the Lumakras.  His appetite's he says is getting better.  He has had no nausea or vomiting.  He has had no bleeding.  He had a Doppler of his right leg.  The blood clot in the right leg seems to be improving.  There is better blood flow through the right blood leg.  He is on Lovenox and doing well with this.  He is having little more in the way of pain.  He is on the Duragesic patch at 12 mcg.  I will double the Duragesic patch to 25 mcg and see about him applying this every 2  days.  He has had no increased shortness of breath.  He does get short of breath with exertion.  He does not have a cough.  I would have to say that overall, his performance status is probably ECOG 2.    Medications:  Allergies as of 07/14/2020   No Known Allergies     Medication List       Accurate as of July 14, 2020  3:36 PM. If you have any questions, ask your nurse or doctor.        atorvastatin 10 MG tablet Commonly known as: LIPITOR Take 1 tablet (10 mg total) by mouth daily.   Creon 24000-76000 units Cpep Generic drug: Pancrelipase (Lip-Prot-Amyl) TAKE 3 CAPSULES BY MOUTH IN THE MORNING, NOON, AND EVERY NIGHT AT BEDTIME   dexamethasone 4 MG tablet Commonly known as: DECADRON TAKE 2 TABLETS(8 MG) BY MOUTH DAILY. START THE DAY AFTER CHEMOTHERAPY FOR 3 DAYS. TAKE WITH FOOD   dronabinol 5 MG capsule Commonly known as: MARINOL TAKE 1 CAPSULE BY MOUTH TWO TIMES DAILY BEFORE LUNCH AND SUPPER   enoxaparin 120 MG/0.8ML injection Commonly known as: LOVENOX Inject 0.67 mLs (100 mg total) into the skin every 12 (twelve) hours.   fentaNYL 12 MCG/HR Commonly known as:  DURAGESIC PLACE 1 PATCH ONTO THE SKIN EVERY 3 DAYS   fluconazole 100 MG tablet Commonly known as: DIFLUCAN Take 1 tablet (100 mg total) by mouth daily.   gabapentin 300 MG capsule Commonly known as: NEURONTIN Take 1 capsule (300 mg total) by mouth 3 (three) times daily.   HYDROcodone-acetaminophen 5-325 MG tablet Commonly known as: NORCO/VICODIN Take 1-2 tablets by mouth every 6 (six) hours as needed for moderate pain or severe pain.   HYDROcodone-homatropine 5-1.5 MG/5ML syrup Commonly known as: Hycodan Take 5 mLs by mouth every 6 (six) hours as needed for cough.   lidocaine-prilocaine cream Commonly known as: EMLA Apply 1 application topically as needed. Use on portacath as directed approx 1-2 hours prior to chemotherapy   loperamide 2 MG capsule Commonly known as: Imodium A-D Take 1 capsule (2  mg total) by mouth as needed for diarrhea or loose stools. Take 2 tablets at onset of diarrhea, then 1 every 2 hours until 12 hours without a BM.   LORazepam 0.5 MG tablet Commonly known as: ATIVAN TAKE 1 TABLET (0.5 MG TOTAL) BY MOUTH EVERY 8 (EIGHT) HOURS AS NEEDED FOR NAUSEA   metFORMIN 1000 MG tablet Commonly known as: GLUCOPHAGE Take 1 tablet (1,000 mg total) by mouth 2 (two) times daily with a meal. What changed: when to take this   metoCLOPramide 10 MG tablet Commonly known as: REGLAN Take 0.5 tablets (5 mg total) by mouth as needed for nausea (nausea/reflux).   metoprolol tartrate 50 MG tablet Commonly known as: LOPRESSOR Take 1 tablet (50 mg total) by mouth 2 (two) times daily.   NovoLOG FlexPen 100 UNIT/ML FlexPen Generic drug: insulin aspart Inject 15 Units into the skin 3 (three) times daily with meals.   ondansetron 8 MG disintegrating tablet Commonly known as: ZOFRAN-ODT Take 1 tablet (8 mg total) by mouth every 8 (eight) hours as needed for nausea.   ondansetron 8 MG tablet Commonly known as: ZOFRAN Take 1 tablet (8 mg total) by mouth 2 (two) times daily. As needed.  Start on Day 3 after chemotherapy.   Pen Needles 30G X 8 MM Misc 1 each by Does not apply route as directed.   prochlorperazine 10 MG tablet Commonly known as: COMPAZINE TAKE 1 TABLET BY MOUTH EVERY 6 HOURS AS NEEDED FOR NAUSEA & VOMITING   UNABLE TO FIND Take 720 mg by mouth daily. Med Name: Lumakras 120 mg capsules-take six daily.       Allergies: No Known Allergies  Past Medical History, Surgical history, Social history, and Family History were reviewed and updated.  Review of Systems: Review of Systems  Constitutional: Positive for malaise/fatigue.  HENT: Negative.   Eyes: Negative.   Respiratory: Positive for cough.   Cardiovascular: Positive for leg swelling.  Gastrointestinal: Positive for diarrhea and vomiting.  Genitourinary: Negative.   Musculoskeletal: Positive for  myalgias.  Neurological: Positive for tingling.  Endo/Heme/Allergies: Negative.   Psychiatric/Behavioral: Negative.      Physical Exam:  height is _0  (1.905 m). His oral temperature is 98.8 F (37.1 C). His blood pressure is 110/72 and his pulse is 96. His respiration is 18 and oxygen saturation is 100%.   Wt Readings from Last 3 Encounters:  06/18/20 228 lb (103.4 kg)  06/11/20 235 lb (106.6 kg)  06/03/20 236 lb 8.9 oz (107.3 kg)    Physical Exam Vitals reviewed.  HENT:     Head: Normocephalic and atraumatic.  Eyes:     Pupils: Pupils are equal, round, and  reactive to light.  Cardiovascular:     Rate and Rhythm: Normal rate and regular rhythm.     Heart sounds: Normal heart sounds.  Pulmonary:     Effort: Pulmonary effort is normal.     Breath sounds: Normal breath sounds.  Abdominal:     General: Bowel sounds are normal.     Palpations: Abdomen is soft.  Musculoskeletal:        General: No tenderness or deformity. Normal range of motion.     Cervical back: Normal range of motion.  Lymphadenopathy:     Cervical: No cervical adenopathy.  Skin:    General: Skin is warm and dry.     Findings: No erythema or rash.  Neurological:     Mental Status: He is alert and oriented to person, place, and time.  Psychiatric:        Behavior: Behavior normal.        Thought Content: Thought content normal.        Judgment: Judgment normal.    .  Lab Results  Component Value Date   WBC 11.5 (H) 07/14/2020   HGB 10.9 (L) 07/14/2020   HCT 34.6 (L) 07/14/2020   MCV 98.9 07/14/2020   PLT 275 07/14/2020   No results found for: FERRITIN, IRON, TIBC, UIBC, IRONPCTSAT Lab Results  Component Value Date   RBC 3.50 (L) 07/14/2020   No results found for: KPAFRELGTCHN, LAMBDASER, KAPLAMBRATIO No results found for: IGGSERUM, IGA, IGMSERUM No results found for: Odetta Pink, SPEI   Chemistry      Component Value Date/Time    NA 139 06/25/2020 0920   K 3.2 (L) 06/25/2020 0920   CL 100 06/25/2020 0920   CO2 30 06/25/2020 0920   BUN 10 06/25/2020 0920   CREATININE 0.47 (L) 06/25/2020 0920   CREATININE 0.75 03/08/2020 1058      Component Value Date/Time   CALCIUM 8.5 (L) 06/25/2020 0920   ALKPHOS 684 (H) 06/25/2020 0920   AST 97 (H) 06/25/2020 0920   ALT 100 (H) 06/25/2020 0920   BILITOT 1.9 (H) 06/25/2020 0920       Impression and Plan: Ms. Danner is a very pleasant 59 yo caucasian gentleman with metastatic adenocarcinoma of the pancreas with extensive disease involving the liver, lungs and lymph nodes.   I really hate the fact that his liver function studies are elevated.  Again I worry about the possibility of tumor progression.  I difficult to mention that his last CA 19-9 back in early July was 99,300.  The ultrasound of liver will really show [place] what is going on I think.  I am a little worried that he may have some ascites.  If he cannot take the Lumakras, then-itis and not sure what else we can do since he just does not have a good tolerance to chemotherapy.  I am happy that the blood clot is better in the leg right leg.  He seems to be a little more functional and more ambulatory.  I know that he is trying his best.  He has such great support with his family.  They are doing everything that he can to make his quality of life as good as possible.  I am incredibly impressed with their dedication.  We probably will have to get him back to see [place] in another couple of weeks or sooner depending on what we find with the ultrasound.    Volanda Napoleon, MD 7/28/20213:36  PM

## 2020-07-15 ENCOUNTER — Inpatient Hospital Stay (HOSPITAL_BASED_OUTPATIENT_CLINIC_OR_DEPARTMENT_OTHER): Payer: PRIVATE HEALTH INSURANCE | Admitting: Hematology & Oncology

## 2020-07-15 ENCOUNTER — Telehealth: Payer: Self-pay | Admitting: Hematology & Oncology

## 2020-07-15 ENCOUNTER — Encounter: Payer: Self-pay | Admitting: Hematology & Oncology

## 2020-07-15 ENCOUNTER — Ambulatory Visit (INDEPENDENT_AMBULATORY_CARE_PROVIDER_SITE_OTHER): Payer: PRIVATE HEALTH INSURANCE

## 2020-07-15 VITALS — BP 96/69 | HR 90 | Temp 97.9°F | Resp 18 | Ht 75.0 in | Wt 230.0 lb

## 2020-07-15 DIAGNOSIS — C259 Malignant neoplasm of pancreas, unspecified: Secondary | ICD-10-CM | POA: Diagnosis not present

## 2020-07-15 DIAGNOSIS — C787 Secondary malignant neoplasm of liver and intrahepatic bile duct: Secondary | ICD-10-CM

## 2020-07-15 DIAGNOSIS — Z5111 Encounter for antineoplastic chemotherapy: Secondary | ICD-10-CM | POA: Diagnosis not present

## 2020-07-15 LAB — CANCER ANTIGEN 19-9: CA 19-9: 297300 U/mL — ABNORMAL HIGH (ref 0–35)

## 2020-07-15 IMAGING — US US ABDOMEN COMPLETE
1 series · 13 of 25 positions shown · non-contrast
Comparison: CT chest [DATE].  CT abdomen [DATE].

CLINICAL DATA: Elevated LFTs.  History of metastatic disease.

EXAM:
ABDOMEN ULTRASOUND COMPLETE

[Series 1: us abdomen complete · 0.25mm/px · 13 of 124 slices shown]
[im 1/124]
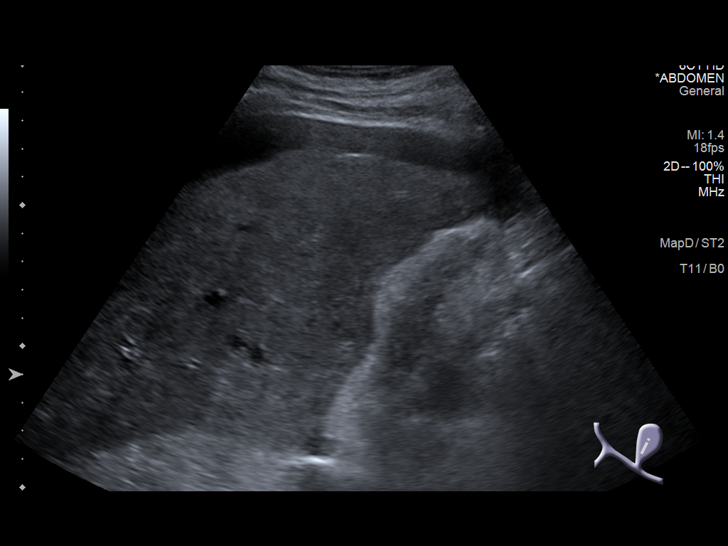
[im 11/124]
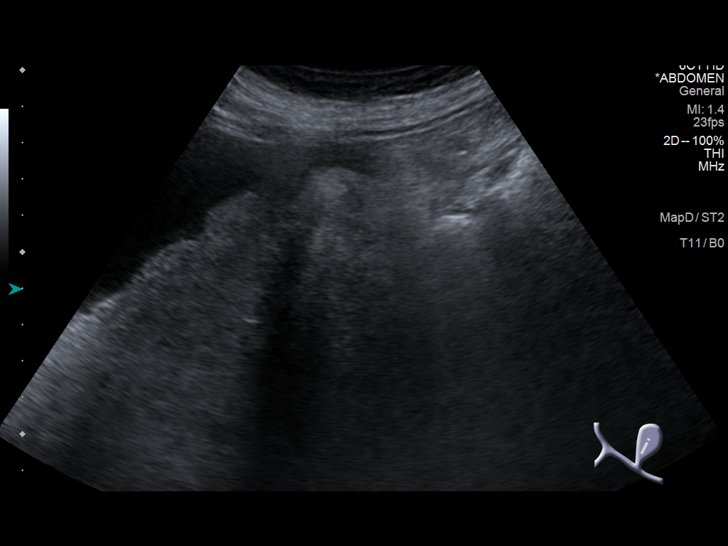
[im 21/124]
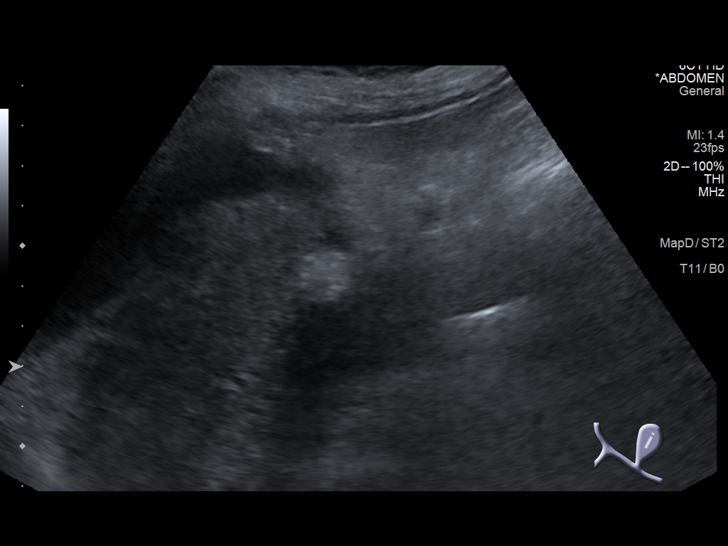
[im 31/124]
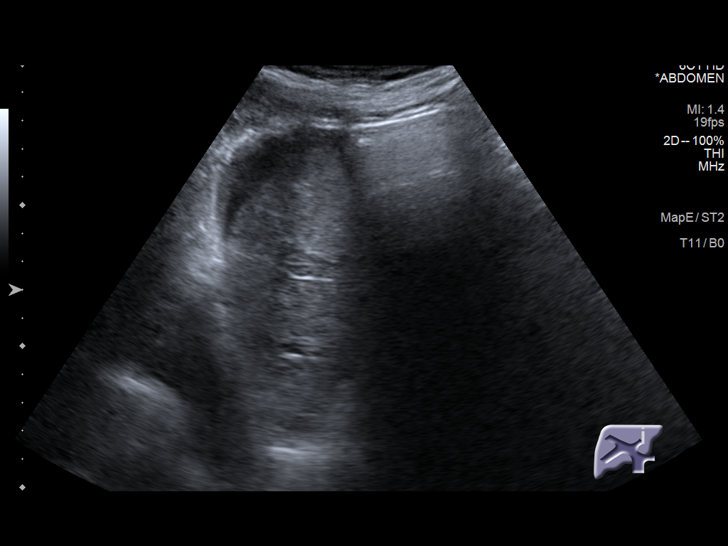
[im 42/124]
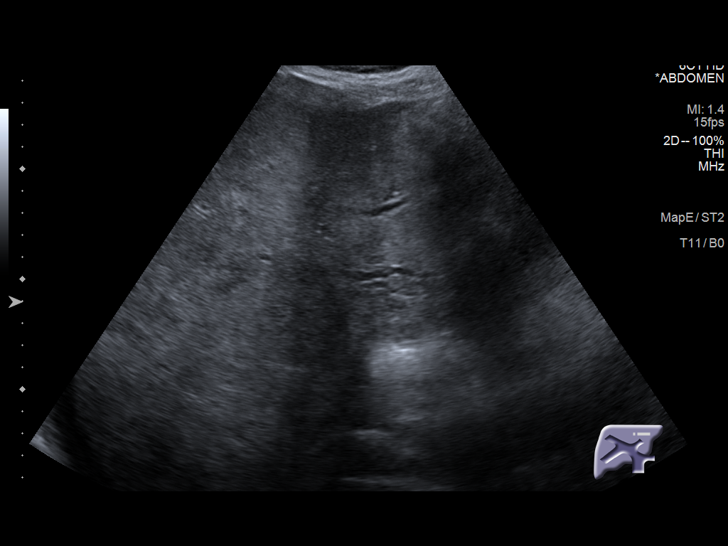
[im 52/124]
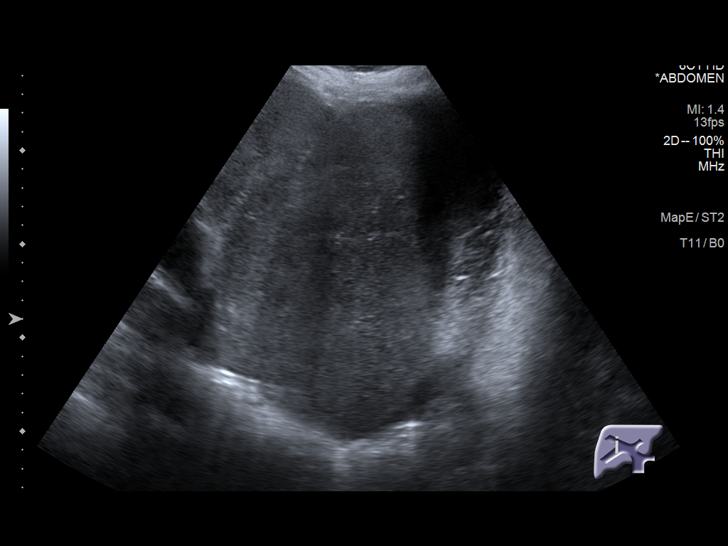
[im 62/124]
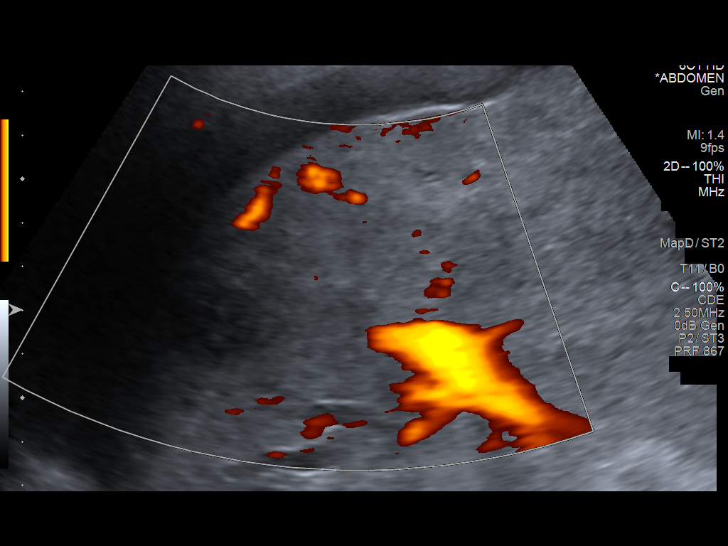
[im 72/124]
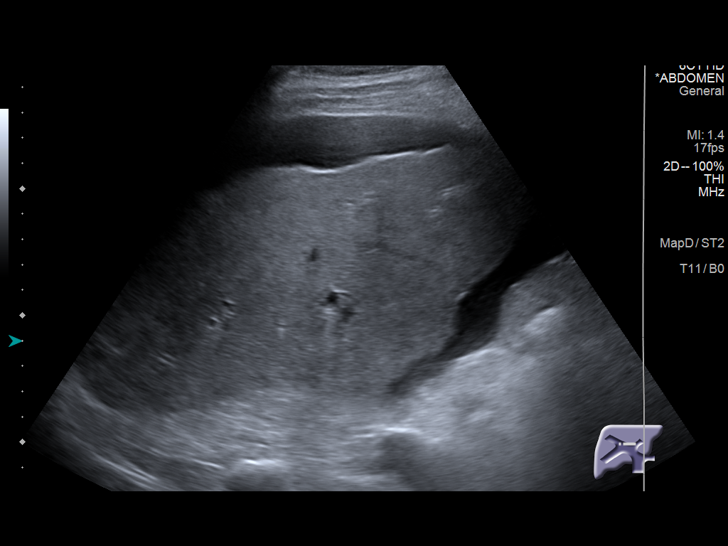
[im 83/124]
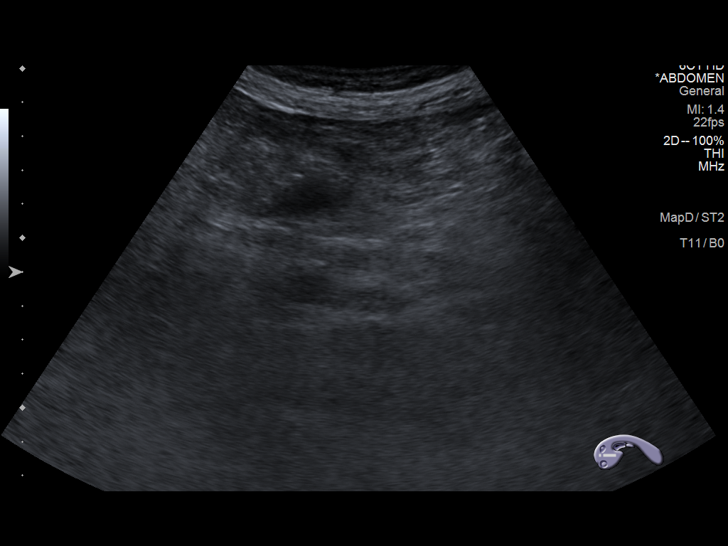
[im 93/124]
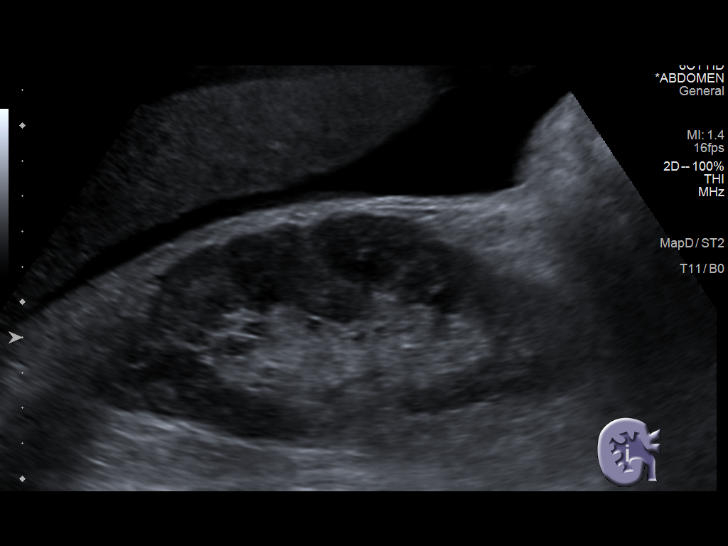
[im 103/124]
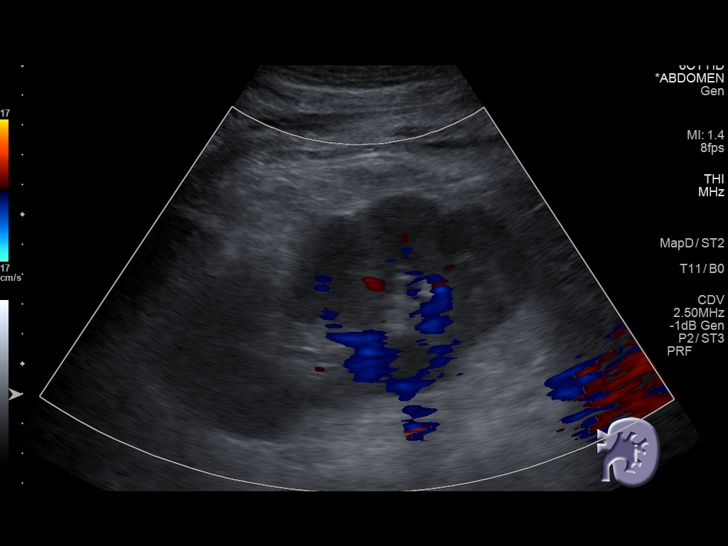
[im 113/124]
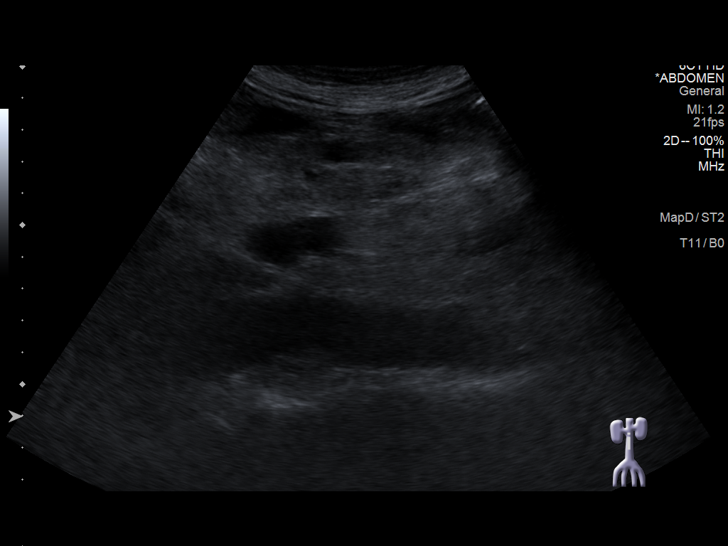
[im 124/124]
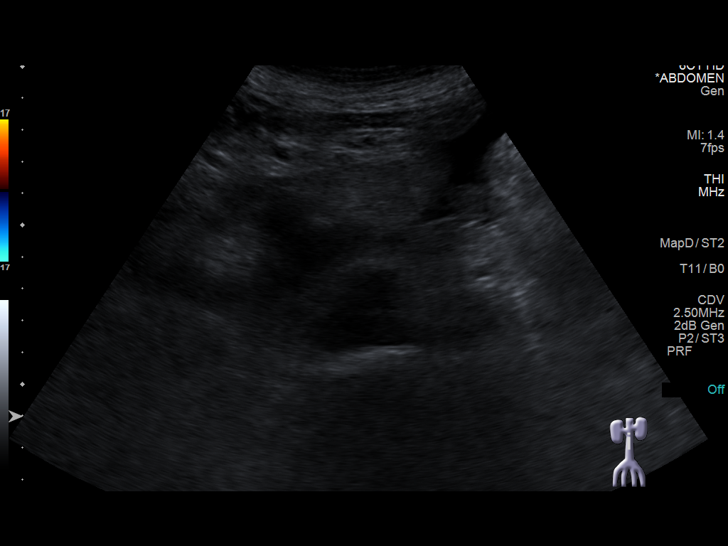

[13 of 25 positions shown; findings below may reference images not displayed]

FINDINGS: Gallbladder: Gallbladder appears to be contracted. No stones
identified.

Common bile duct: Diameter: 4.8 mm, not well visualized.

Liver: Heterogeneous parenchymal pattern consistent with patient's
known metastatic disease. 3.0 cm solid mass in noted in the right
hepatic lobe. Coarse lobulated echotexture suggesting the
possibility of cirrhosis. Portal vein is patent on color Doppler
imaging with normal direction of blood flow towards the liver.

IVC: No abnormality visualized.

Pancreas: Not visualized

Spleen: Spleen is enlarged at 14.3 cc with a volume of 6 of 9 cc.

Right Kidney: Length: 14.1 cm. Echogenicity within normal limits. No
mass or hydronephrosis visualized.

Left Kidney: Length: 14.7 cm. Echogenicity within normal limits. No
mass or hydronephrosis visualized.

Abdominal aorta: No aneurysm visualized.

Other findings: Moderate ascites.
IMPRESSION: 1. Gallbladder appears to be contracted. No gallstones identified.
No biliary distention. Common bile duct well visualized.

2. Heterogeneous hepatic parenchymal pattern consistent patient's
known metastatic disease. 3.0 cm hypoechoic solid mass in the right
hepatic lobe. Liver has a nodular contour suggesting cirrhosis.
Portal vein is patent with normal direction of flow.

3.  Splenomegaly at 14.3 cm with a volume [6B] cc.

4.  Moderate ascites.

## 2020-07-15 NOTE — Progress Notes (Signed)
Oncology Nurse Navigator Documentation  Oncology Nurse Navigator Flowsheets 07/14/2020  Abnormal Finding Date -  Diagnosis Status -  Planned Course of Treatment -  Phase of Treatment -  Chemotherapy Actual Start Date: -  Navigator Follow Up Date: 07/29/2020  Navigator Follow Up Reason: Follow-up Appointment  Navigator Location [facility]  Referral Date to RadOnc/MedOnc -  Navigator Encounter Type Treatment  Telephone -  Treatment Initiated Date -  Patient Visit Type MedOnc  Treatment Phase Active Tx  Barriers/Navigation Needs Anxiety;Financial Toxicity;Morbidities/Frailty;Pain;Underinsured  Education -  Interventions Psycho-Social Support  Acuity Level 3-Moderate Needs (3-4 Barriers Identified)  Referrals -  Coordination of Care -  Education Method -  Support Groups/Services Friends and Family  Time Spent with Patient 15

## 2020-07-15 NOTE — Telephone Encounter (Signed)
Appointments scheduled calendar printed & mailed per 7/28 los 

## 2020-07-15 NOTE — Progress Notes (Signed)
Hematology and Oncology Follow Up Visit  Melvin [name] [id number] 12-Jun-1961 59 y.o. 07/15/2020   Principle Diagnosis:  Metastatic adenocarcinoma of the pancreas-liver, lung and lymph node metastasis - KRAS (+) Pulmonary embolism -- dx on 04/20/2020 RIGHT leg DVT -- femoral - peroneal -- dx 05/28/2020  Past Therapy: Status post cycle 2 of FOLFIRINOX -- d/c on 04/20/2020  Current Therapy:        Gemzar/Abraxane -- started 04/30/2020, s/p cycle 1 - d/c on 06/25/2020 Xarelto 20 mg po q day -- started 04/20/2020 -- d/c on 05/28/2020 Lovenox 100 mg SQ BID -- start on 05/28/2020 Lumakras 100 mg po q day -- start on 06/26/2020   Interim History:  Melvin [name] is here today for follow-up.  He comes in with a daughter.  He actually looks the best have seen him looking quite a while.  He sounds more healthy.  He sounds stronger.  He looks stronger.  However, I cannot help but no that he had a little bit of yellow with the sclera.  I looked at his lab work.  His bilirubin is now 6.6.  Also troublesome is the fact that his blood sugar was down to 55.  He has never had low blood sugars.  I have to really worry about his liver.  It is possible that the Lumakras might be causing this.  There is about a 25% incidence of abnormal liver function studies.  We will get an emergent ultrasound of his liver.  So far all things had problems with metastasis to his liver.  If the liver looks okay on ultrasound, we will have him stop the Lumakras.  His appetite's he says is getting better.  He has had no nausea or vomiting.  He has had no bleeding.  He had a Doppler of his right leg.  The blood clot in the right leg seems to be improving.  There is better blood flow through the right blood leg.  He is on Lovenox and doing well with this.  He is having little more in the way of pain.  He is on the Duragesic patch at 12 mcg.  I will double the Duragesic patch to 25 mcg and see about him applying this every 2  days.  He has had no increased shortness of breath.  He does get short of breath with exertion.  He does not have a cough.  I would have to say that overall, his performance status is probably ECOG 2.    Medications:  Allergies as of 07/15/2020   No Known Allergies     Medication List       Accurate as of July 15, 2020  4:47 PM. If you have any questions, ask your nurse or doctor.        atorvastatin 10 MG tablet Commonly known as: LIPITOR Take 1 tablet (10 mg total) by mouth daily.   Creon 24000-76000 units Cpep Generic drug: Pancrelipase (Lip-Prot-Amyl) TAKE 3 CAPSULES BY MOUTH IN THE MORNING, NOON, AND EVERY NIGHT AT BEDTIME   dexamethasone 4 MG tablet Commonly known as: DECADRON TAKE 2 TABLETS(8 MG) BY MOUTH DAILY. START THE DAY AFTER CHEMOTHERAPY FOR 3 DAYS. TAKE WITH FOOD   dronabinol 5 MG capsule Commonly known as: MARINOL TAKE 1 CAPSULE BY MOUTH TWO TIMES DAILY BEFORE LUNCH AND SUPPER   enoxaparin 120 MG/0.8ML injection Commonly known as: LOVENOX Inject 0.67 mLs (100 mg total) into the skin every 12 (twelve) hours.   fentaNYL 25 MCG/HR Commonly known as:  [place] 1 patch onto the skin every other day.   fluconazole 100 MG tablet Commonly known as: DIFLUCAN Take 1 tablet (100 mg total) by mouth daily.   gabapentin 300 MG capsule Commonly known as: NEURONTIN Take 1 capsule (300 mg total) by mouth 3 (three) times daily.   HYDROcodone-acetaminophen 5-325 MG tablet Commonly known as: NORCO/VICODIN Take 1-2 tablets by mouth every 6 (six) hours as needed for moderate pain or severe pain.   HYDROcodone-homatropine 5-1.5 MG/5ML syrup Commonly known as: Hycodan Take 5 mLs by mouth every 6 (six) hours as needed for cough.   lidocaine-prilocaine cream Commonly known as: EMLA Apply 1 application topically as needed. Use on portacath as directed approx 1-2 hours prior to chemotherapy   loperamide 2 MG capsule Commonly known as: Imodium A-D Take 1  capsule (2 mg total) by mouth as needed for diarrhea or loose stools. Take 2 tablets at onset of diarrhea, then 1 every 2 hours until 12 hours without a BM.   LORazepam 0.5 MG tablet Commonly known as: ATIVAN TAKE 1 TABLET (0.5 MG TOTAL) BY MOUTH EVERY 8 (EIGHT) HOURS AS NEEDED FOR NAUSEA   metFORMIN 1000 MG tablet Commonly known as: GLUCOPHAGE Take 1 tablet (1,000 mg total) by mouth 2 (two) times daily with a meal. What changed: when to take this   metoCLOPramide 10 MG tablet Commonly known as: REGLAN Take 0.5 tablets (5 mg total) by mouth as needed for nausea (nausea/reflux).   metoprolol tartrate 50 MG tablet Commonly known as: LOPRESSOR Take 1 tablet (50 mg total) by mouth 2 (two) times daily.   NovoLOG FlexPen 100 UNIT/ML FlexPen Generic drug: insulin aspart Inject 15 Units into the skin 3 (three) times daily with meals.   ondansetron 8 MG disintegrating tablet Commonly known as: ZOFRAN-ODT Take 1 tablet (8 mg total) by mouth every 8 (eight) hours as needed for nausea.   ondansetron 8 MG tablet Commonly known as: ZOFRAN Take 1 tablet (8 mg total) by mouth 2 (two) times daily. As needed.  Start on Day 3 after chemotherapy.   Pen Needles 30G X 8 MM Misc 1 each by Does not apply route as directed.   prochlorperazine 10 MG tablet Commonly known as: COMPAZINE TAKE 1 TABLET BY MOUTH EVERY 6 HOURS AS NEEDED FOR NAUSEA & VOMITING   UNABLE TO FIND Take 720 mg by mouth daily. Med Name: Lumakras 120 mg capsules-take six daily.       Allergies: No Known Allergies  Past Medical History, Surgical history, Social history, and Family History were reviewed and updated.  Review of Systems: Review of Systems  Constitutional: Positive for malaise/fatigue.  HENT: Negative.   Eyes: Negative.   Respiratory: Positive for cough.   Cardiovascular: Positive for leg swelling.  Gastrointestinal: Positive for diarrhea and vomiting.  Genitourinary: Negative.   Musculoskeletal: Positive  for myalgias.  Neurological: Positive for tingling.  Endo/Heme/Allergies: Negative.   Psychiatric/Behavioral: Negative.      Physical Exam:  height is '6\' 3"'$  (1.905 m) and weight is 230 lb (104.3 kg) (abnormal). His oral temperature is 97.9 F (36.6 C). His blood pressure is 96/69 and his pulse is 90. His respiration is 18 and oxygen saturation is 95%.   Wt Readings from Last 3 Encounters:  07/15/20 (!) 230 lb (104.3 kg)  06/18/20 228 lb (103.4 kg)  06/11/20 235 lb (106.6 kg)    Physical Exam Vitals reviewed.  HENT:     Head: Normocephalic and atraumatic.  Eyes:  Pupils: Pupils are equal, round, and reactive to light.  Cardiovascular:     Rate and Rhythm: Normal rate and regular rhythm.     Heart sounds: Normal heart sounds.  Pulmonary:     Effort: Pulmonary effort is normal.     Breath sounds: Normal breath sounds.  Abdominal:     General: Bowel sounds are normal.     Palpations: Abdomen is soft.  Musculoskeletal:        General: No tenderness or deformity. Normal range of motion.     Cervical back: Normal range of motion.  Lymphadenopathy:     Cervical: No cervical adenopathy.  Skin:    General: Skin is warm and dry.     Findings: No erythema or rash.  Neurological:     Mental Status: He is alert and oriented to person, place, and time.  Psychiatric:        Behavior: Behavior normal.        Thought Content: Thought content normal.        Judgment: Judgment normal.    .  Lab Results  Component Value Date   WBC 11.5 (H) 07/14/2020   HGB 10.9 (L) 07/14/2020   HCT 34.6 (L) 07/14/2020   MCV 98.9 07/14/2020   PLT 275 07/14/2020   No results found for: FERRITIN, IRON, TIBC, UIBC, IRONPCTSAT Lab Results  Component Value Date   RBC 3.50 (L) 07/14/2020   No results found for: KPAFRELGTCHN, LAMBDASER, KAPLAMBRATIO No results found for: IGGSERUM, IGA, IGMSERUM No results found for: Ronnald Ramp, A1GS, A2GS, Violet Baldy, MSPIKE, SPEI    Chemistry      Component Value Date/Time   NA 142 07/14/2020 1500   K 3.0 (LL) 07/14/2020 1500   CL 104 07/14/2020 1500   CO2 28 07/14/2020 1500   BUN 10 07/14/2020 1500   CREATININE 0.53 (L) 07/14/2020 1500   CREATININE 0.75 03/08/2020 1058      Component Value Date/Time   CALCIUM 8.5 (L) 07/14/2020 1500   ALKPHOS 1,028 (H) 07/14/2020 1500   AST 74 (H) 07/14/2020 1500   ALT 51 (H) 07/14/2020 1500   BILITOT 6.6 ([place]) 07/14/2020 1500       Impression and Plan: Ms. Wenner is a very pleasant 59 yo caucasian gentleman with metastatic adenocarcinoma of the pancreas with extensive disease involving the liver, lungs and lymph nodes.   I really hate the fact that his liver function studies are elevated.  Again I worry about the possibility of tumor progression.  I difficult to mention that his last CA 19-9 back in early July was 99,300.  The ultrasound of liver will really show [place] what is going on I think.  I am a little worried that he may have some ascites.  If he cannot take the Lumakras, then-itis and not sure what else we can do since he just does not have a good tolerance to chemotherapy.  I am happy that the blood clot is better in the leg right leg.  He seems to be a little more functional and more ambulatory.  I know that he is trying his best.  He has such great support with his family.  They are doing everything that he can to make his quality of life as good as possible.  I am incredibly impressed with their dedication.  We probably will have to get him back to see [place] in another couple of weeks or sooner depending on what we find with the ultrasound.  Volanda Napoleon, MD 7/29/20214:47 PM   ADDENDUM: Mr. Demetriou had his abdominal ultrasound today.  The ultrasound showed ascites.  There was splenomegaly.  There was some changes of cirrhosis.  He had stable, or what appeared to be stable, liver metastasis.  We are going to get a paracentesis on him.  This will be sent for  cytology.  I worry that this might be cirrhosis and not metastatic disease.  If this is metastatic disease with the ascites, then he will clearly stop the Lumakras.  If the ascites is from cirrhosis, I think we can probably continue him on the Lumakras.  I am not sure why he would have the cirrhosis and possible portal hypertension.  We we will also make sure he does not develop problems with hypoglycemia.  I told his daughter that his wife can call us if his blood sugars are over 300 or less than 100.  I would like to see him back in 1 week.  I spoke with radiology.  The paracentesis will be done on Friday.  We will make sure fluid is sent off for cytology, cell count, albumin, and LDH.  Lattie Haw, MD

## 2020-07-16 ENCOUNTER — Ambulatory Visit (HOSPITAL_COMMUNITY)
Admission: RE | Admit: 2020-07-16 | Discharge: 2020-07-16 | Disposition: A | Discharge status: Home / Self Care | Payer: PRIVATE HEALTH INSURANCE | Source: Ambulatory Visit | Attending: Hematology & Oncology | Admitting: Hematology & Oncology

## 2020-07-16 ENCOUNTER — Telehealth: Payer: Self-pay | Admitting: *Deleted

## 2020-07-16 ENCOUNTER — Other Ambulatory Visit: Payer: Self-pay

## 2020-07-16 DIAGNOSIS — C259 Malignant neoplasm of pancreas, unspecified: Secondary | ICD-10-CM | POA: Diagnosis present

## 2020-07-16 DIAGNOSIS — C787 Secondary malignant neoplasm of liver and intrahepatic bile duct: Secondary | ICD-10-CM | POA: Insufficient documentation

## 2020-07-16 LAB — BODY FLUID CELL COUNT WITH DIFFERENTIAL
Eos, Fluid: 0 %
Lymphs, Fluid: 55 %
Monocyte-Macrophage-Serous Fluid: 45 % — ABNORMAL LOW (ref 50–90)
Neutrophil Count, Fluid: 0 % (ref 0–25)
Total Nucleated Cell Count, Fluid: 182 cu mm (ref 0–1000)

## 2020-07-16 LAB — ALBUMIN, PLEURAL OR PERITONEAL FLUID: Albumin, Fluid: <1 g/dL

## 2020-07-16 IMAGING — US US PARACENTESIS
1 series · 5 of 5 positions shown · non-contrast
Comparison: none

INDICATION: Patient with history of metastatic pancreatic cancer, ascites.
Request received for diagnostic and therapeutic paracentesis.

[Series 1: us paracentesis · 5 of 5 slices shown]
[im 1/5]
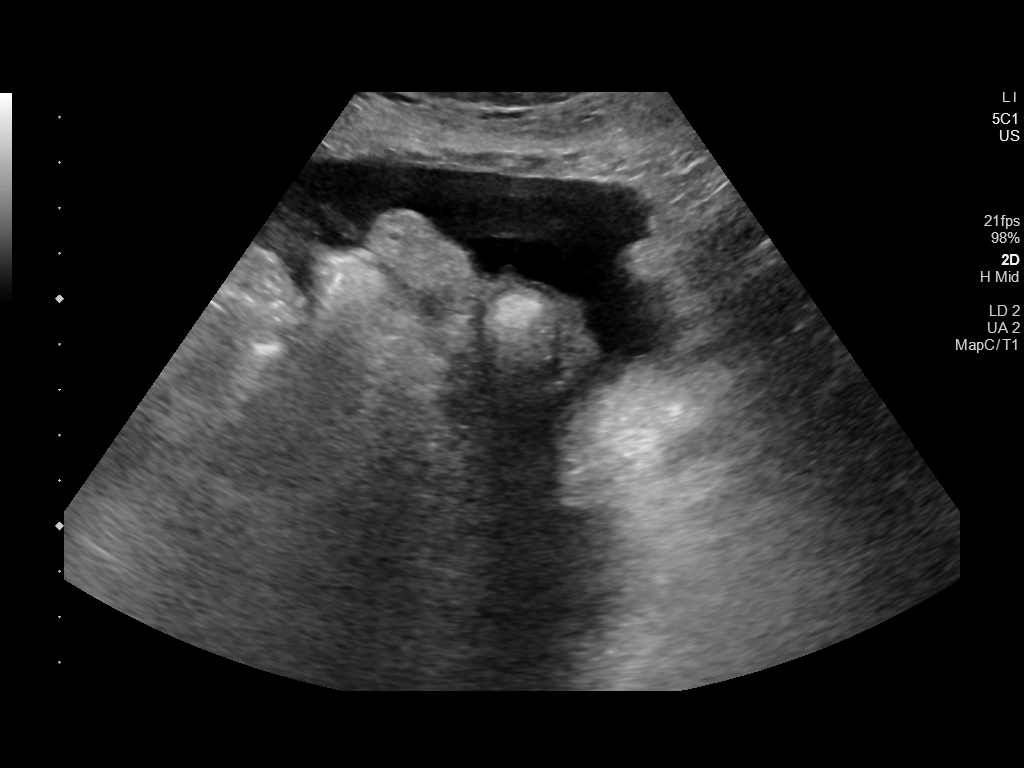
[im 2/5]
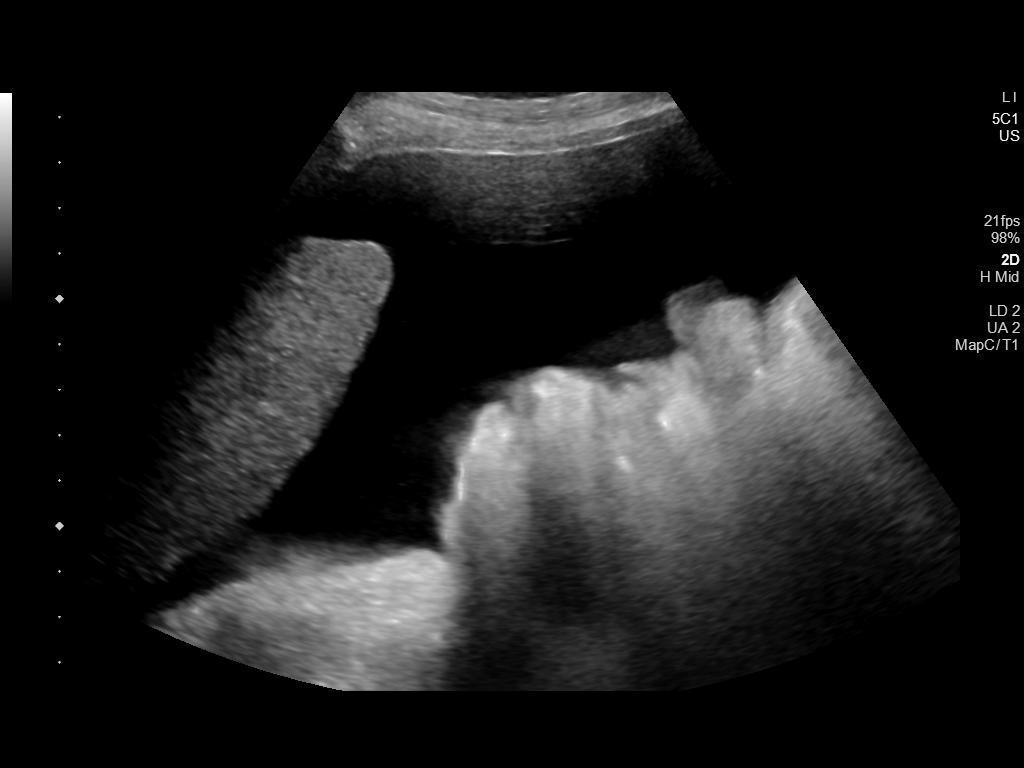
[im 3/5]
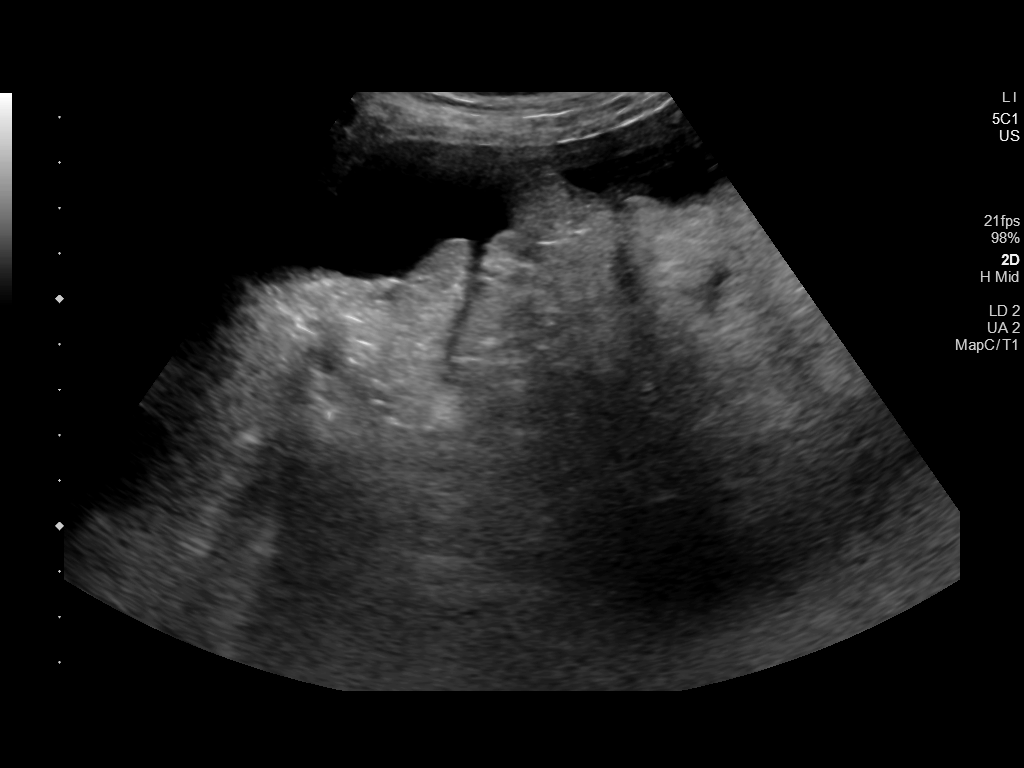
[im 4/5]
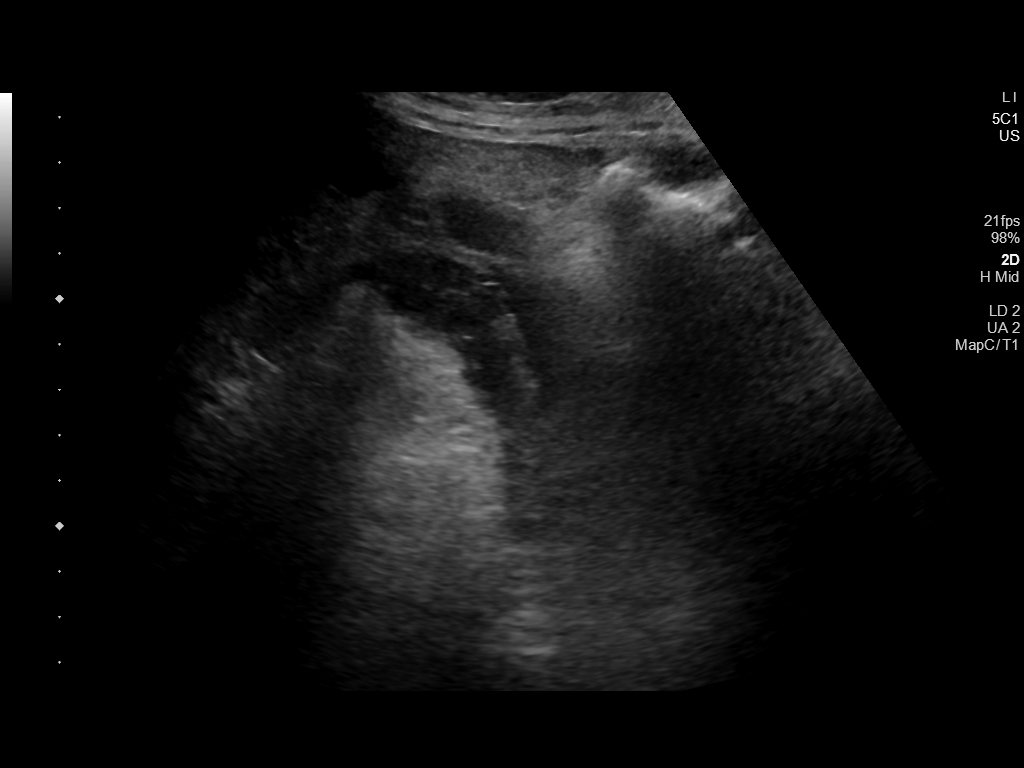
[im 5/5]
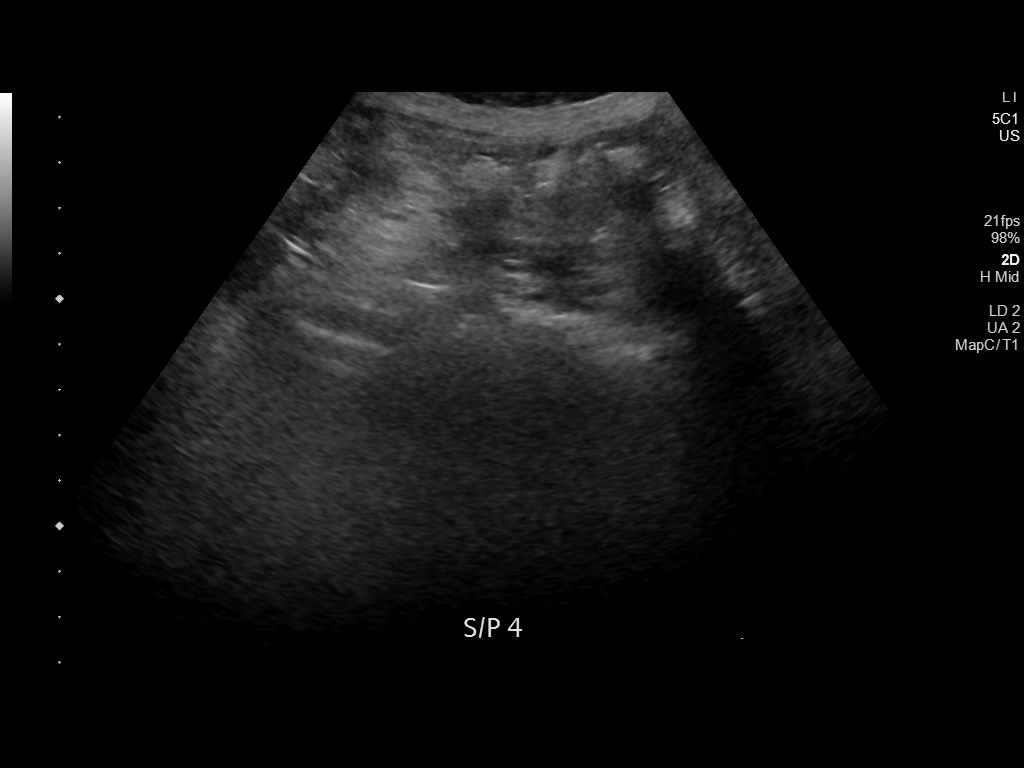

[5 of 5 positions shown; findings below may reference images not displayed]

EXAM:
ULTRASOUND GUIDED DIAGNOSTIC AND THERAPEUTIC PARACENTESIS

MEDICATIONS:
1% lidocaine to skin and subcutaneous tissue

COMPLICATIONS:
None immediate.

PROCEDURE:
Informed written consent was obtained from the patient after a
discussion of the risks, benefits and alternatives to treatment. A
timeout was performed prior to the initiation of the procedure.

Initial ultrasound scanning demonstrates a large amount of ascites
within the right lower abdominal quadrant. The right lower abdomen
was prepped and draped in the usual sterile fashion. 1% lidocaine
was used for local anesthesia.

Following this, a 19 gauge, 10-cm, Yueh catheter was introduced. An
ultrasound image was saved for documentation purposes. The
paracentesis was performed. The catheter was removed and a dressing
was applied. The patient tolerated the procedure well without
immediate post procedural complication.
FINDINGS: A total of approximately 4 liters of yellow fluid was removed.
Samples were sent to the laboratory as requested by the clinical
team.
IMPRESSION: Successful ultrasound-guided diagnostic and therapeutic paracentesis
yielding 4 liters of peritoneal fluid.

## 2020-07-16 MED ORDER — LIDOCAINE HCL 1 % IJ SOLN
INTRAMUSCULAR | Status: AC
  Filled 2020-07-16: qty 20

## 2020-07-16 NOTE — Telephone Encounter (Signed)
Message received from patient's wife stating that pt had four liters drained from his paracentesis today and that she has noticed that pt.'s sclera are more yellow in color.  Dr. Marin Olp notified.  Call placed back to patient's wife to check on pt.'s status per order of Dr. Marin Olp.  Pt.'s wife states that pt is "feeling ok" at this time and has no complaints.  Informed pt.'s wife that I would call her on Monday morning to check on patient per order of Dr. Marin Olp and that if pt had any problems/issues over the weekend to contact the on call physician or go to the ED.  Teach back done.  Pt.'s wife is appreciative of call back and has no further questions at this time.

## 2020-07-16 NOTE — Procedures (Signed)
Ultrasound-guided diagnostic and therapeutic paracentesis performed yielding 4 liters of yellow fluid. No immediate complications.  A portion of the fluid was submitted to the lab for preordered studies. EBL none.

## 2020-07-18 LAB — LD, BODY FLUID (OTHER): LD, Body Fluid: 81 IU/L

## 2020-07-19 ENCOUNTER — Telehealth: Payer: Self-pay | Admitting: *Deleted

## 2020-07-19 MED FILL — GABAPENTIN 300 MG CAPSULE: 300 | 30 days supply | Qty: 90 | Fill #1

## 2020-07-19 NOTE — Telephone Encounter (Signed)
Call placed to pt.'s wife to check on patient's status this AM.  Pt.'s wife states that pt is eating and drinking without difficulty, has slight pain at paracentesis site only and feels as though he does not need to come in today to be seen.  Dr. Marin Olp notified and would like for pt to come in the end of this week.  Message sent to scheduling.  Call placed back to patient's wife to inform her to have pt come in this Friday, 07/23/20 at 3:15PM.  Pt.s wife appreciative of call back and states that appt on 07/23/20 will work for them.

## 2020-07-20 LAB — CYTOLOGY - NON PAP

## 2020-07-23 ENCOUNTER — Inpatient Hospital Stay: Payer: PRIVATE HEALTH INSURANCE | Attending: Hematology & Oncology

## 2020-07-23 ENCOUNTER — Inpatient Hospital Stay: Payer: PRIVATE HEALTH INSURANCE

## 2020-07-23 ENCOUNTER — Encounter: Payer: Self-pay | Admitting: Hematology & Oncology

## 2020-07-23 ENCOUNTER — Other Ambulatory Visit: Payer: Self-pay

## 2020-07-23 ENCOUNTER — Telehealth: Payer: Self-pay | Admitting: *Deleted

## 2020-07-23 ENCOUNTER — Inpatient Hospital Stay (HOSPITAL_BASED_OUTPATIENT_CLINIC_OR_DEPARTMENT_OTHER): Payer: PRIVATE HEALTH INSURANCE | Admitting: Hematology & Oncology

## 2020-07-23 VITALS — BP 88/55 | HR 88 | Temp 97.2°F | Resp 17 | Ht 75.0 in | Wt 239.1 lb

## 2020-07-23 DIAGNOSIS — C787 Secondary malignant neoplasm of liver and intrahepatic bile duct: Secondary | ICD-10-CM | POA: Insufficient documentation

## 2020-07-23 DIAGNOSIS — C779 Secondary and unspecified malignant neoplasm of lymph node, unspecified: Secondary | ICD-10-CM | POA: Insufficient documentation

## 2020-07-23 DIAGNOSIS — C7800 Secondary malignant neoplasm of unspecified lung: Secondary | ICD-10-CM | POA: Insufficient documentation

## 2020-07-23 DIAGNOSIS — Z7901 Long term (current) use of anticoagulants: Secondary | ICD-10-CM | POA: Insufficient documentation

## 2020-07-23 DIAGNOSIS — R180 Malignant ascites: Secondary | ICD-10-CM | POA: Diagnosis not present

## 2020-07-23 DIAGNOSIS — K7290 Hepatic failure, unspecified without coma: Secondary | ICD-10-CM | POA: Diagnosis not present

## 2020-07-23 DIAGNOSIS — C259 Malignant neoplasm of pancreas, unspecified: Secondary | ICD-10-CM | POA: Insufficient documentation

## 2020-07-23 DIAGNOSIS — Z95828 Presence of other vascular implants and grafts: Secondary | ICD-10-CM

## 2020-07-23 LAB — CBC WITH DIFFERENTIAL (CANCER CENTER ONLY)
Abs Immature Granulocytes: 0.14 10*3/uL — ABNORMAL HIGH (ref 0.00–0.07)
Basophils Absolute: 0 10*3/uL (ref 0.0–0.1)
Basophils Relative: 0 %
Eosinophils Absolute: 0.1 10*3/uL (ref 0.0–0.5)
Eosinophils Relative: 1 %
HCT: 35.4 % — ABNORMAL LOW (ref 39.0–52.0)
Hemoglobin: 11.1 g/dL — ABNORMAL LOW (ref 13.0–17.0)
Immature Granulocytes: 1 %
Lymphocytes Relative: 11 %
Lymphs Abs: 1.4 10*3/uL (ref 0.7–4.0)
MCH: 32 pg (ref 26.0–34.0)
MCHC: 31.4 g/dL (ref 30.0–36.0)
MCV: 102 fL — ABNORMAL HIGH (ref 80.0–100.0)
Monocytes Absolute: 1.2 10*3/uL — ABNORMAL HIGH (ref 0.1–1.0)
Monocytes Relative: 10 %
Neutro Abs: 9.6 10*3/uL — ABNORMAL HIGH (ref 1.7–7.7)
Neutrophils Relative %: 77 %
Platelet Count: 351 10*3/uL (ref 150–400)
RBC: 3.47 MIL/uL — ABNORMAL LOW (ref 4.22–5.81)
RDW: 18.3 % — ABNORMAL HIGH (ref 11.5–15.5)
WBC Count: 12.5 10*3/uL — ABNORMAL HIGH (ref 4.0–10.5)
nRBC: 0 % (ref 0.0–0.2)

## 2020-07-23 LAB — CMP (CANCER CENTER ONLY)
ALT: 64 U/L — ABNORMAL HIGH (ref 0–44)
AST: 102 U/L — ABNORMAL HIGH (ref 15–41)
Albumin: 2.7 g/dL — ABNORMAL LOW (ref 3.5–5.0)
Alkaline Phosphatase: 1427 U/L — ABNORMAL HIGH (ref 38–126)
Anion gap: 9 (ref 5–15)
BUN: 13 mg/dL (ref 6–20)
CO2: 30 mmol/L (ref 22–32)
Calcium: 8.4 mg/dL — ABNORMAL LOW (ref 8.9–10.3)
Chloride: 103 mmol/L (ref 98–111)
Creatinine: 0.87 mg/dL (ref 0.61–1.24)
GFR, Est AFR Am: >60 mL/min (ref >60)
GFR, Estimated: >60 mL/min (ref >60)
Glucose, Bld: 63 mg/dL — ABNORMAL LOW (ref 70–99)
Potassium: 2.8 mmol/L — CL (ref 3.5–5.1)
Sodium: 142 mmol/L (ref 135–145)
Total Bilirubin: 11.7 mg/dL (ref 0.3–1.2)
Total Protein: 5.4 g/dL — ABNORMAL LOW (ref 6.5–8.1)

## 2020-07-23 LAB — SAMPLE TO BLOOD BANK

## 2020-07-23 MED ORDER — HEPARIN SOD (PORK) LOCK FLUSH 100 UNIT/ML IV SOLN
500.0000 [IU] | Freq: Once | INTRAVENOUS | Status: AC
  Administered 2020-07-23: 500 [IU] via INTRAVENOUS
  Filled 2020-07-23: qty 5

## 2020-07-23 MED ORDER — SODIUM CHLORIDE 0.9% FLUSH
10.0000 mL | Freq: Once | INTRAVENOUS | Status: AC
  Administered 2020-07-23: 10 mL via INTRAVENOUS
  Filled 2020-07-23: qty 10

## 2020-07-23 MED FILL — CREON DR 24,000 UNITS CAP: 24000-76000 | 3 days supply | Qty: 30 | Fill #1

## 2020-07-23 NOTE — Progress Notes (Signed)
Hematology and Oncology Follow Up Visit  Melvin [name] [id number] 1960/12/28 59 y.o. 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 is here today for follow-up.  Unfortunately, I know now what the problem with him is.  It is clear that his cancer is progressing quickly.  When we last saw him, his bilirubin was up to 6.6.  Now, the bilirubin is up to 12.  His liver is failing him.  I have to believe that this is from malignancy.  His last CA 19-9 went from 99,000 up to 279,000.  He had ascites taken out a week ago.  He had 4 L of fluid taken out.  The pathology report ([id number]) showed that the ascites was malignant.  I think we are now looking at a rapidly declining situation.  I know that he has tried everything.  I think we are just dealing with a malignancy in which resistance has been a real problem and has developed quickly.  He is quite jaundiced now.  He has more ascites.  He comes in with his wife.  I talked to them both about this situation.  I think we clearly are in a situation where we have to focus on comfort care.  He just is not going to be able to take any further treatment.  We talked about Hospice.  I think Hospice would really be a great idea for he and his family.  Hospice can help out quite a bit.  We will have to figure out which hospice can follow him.  I talked him about what I thought his  prognosis would be.  He wanted to know.  I was quite honest with him and told him that I did not think he would make it through the month of August.  I did talk to him about life support measures.  He DOES NOT want to be kept alive on a machine.  I totally agree with this.  If he were to be placed on life support, he would never come off and it would be up to his family to turn off the machine.  As such, he is a DO NOT RESUSCITATE.  This is a very bitter day for me.  I really thought we could help him out.  He just has the K-ras mutation that really has made his tumor resilient.  Of note, he is on a Lovenox.  I told his wife that they can just finish up what ever Lovenox he has left and stop.  I would just want him to be comfortable.  I really think with his liver failure quickly, that he would sleep more eat less and eventually lapsed into a coma.  I know this was incredibly difficult for [name] to hear.  However, he has always wanted me to be honest with him and be upfront with him.  Again this  was incredibly difficult for me personally.  Me and my nurses have really liked him.  He has been so kind and so thankful for the care that he has received here.  He and his wife really felt comfortable being treated here and knew that they could get the care that he deserved.   Medications:  Allergies as of 07/23/2020   No Known Allergies     Medication List       Accurate as of July 23, 2020  5:45 PM. If you have any questions, ask your nurse or doctor.        STOP taking these medications   atorvastatin 10 MG tablet Commonly known as: LIPITOR Stopped by: Volanda Napoleon, MD   fluconazole 100 MG tablet Commonly known as: DIFLUCAN Stopped by: Volanda Napoleon, MD   metFORMIN 1000 MG tablet Commonly known as: GLUCOPHAGE Stopped by: Volanda Napoleon, MD     TAKE these medications   Creon 24000-76000 units Cpep Generic drug: Pancrelipase (Lip-Prot-Amyl) TAKE 3 CAPSULES BY MOUTH IN THE  MORNING, NOON, AND EVERY NIGHT AT BEDTIME   dexamethasone 4 MG tablet Commonly known as: DECADRON TAKE 2 TABLETS(8 MG) BY MOUTH DAILY. START THE DAY AFTER CHEMOTHERAPY FOR 3 DAYS. TAKE WITH FOOD   dronabinol 5 MG capsule Commonly known as: MARINOL TAKE 1 CAPSULE BY MOUTH TWO TIMES DAILY BEFORE LUNCH AND SUPPER   enoxaparin 120 MG/0.8ML injection Commonly known as: LOVENOX Inject 0.67 mLs (100 mg total) into the skin every 12 (twelve) hours.   fentaNYL 25 MCG/HR Commonly known as: Causey 1 patch onto the skin every other day.   gabapentin 300 MG capsule Commonly known as: NEURONTIN Take 1 capsule (300 mg total) by mouth 3 (three) times daily.   HYDROcodone-acetaminophen 5-325 MG tablet Commonly known as: NORCO/VICODIN Take 1-2 tablets by mouth every 6 (six) hours as needed for moderate pain or severe pain.   HYDROcodone-homatropine 5-1.5 MG/5ML syrup Commonly known as: Hycodan Take 5 mLs by mouth every 6 (six) hours as needed for cough.   lidocaine-prilocaine cream Commonly known as: EMLA Apply 1 application topically as needed. Use on portacath as directed approx 1-2 hours prior to chemotherapy   loperamide 2 MG capsule Commonly known as: Imodium A-D Take 1 capsule (2 mg total) by mouth as needed for diarrhea or loose stools. Take 2 tablets at onset of diarrhea, then 1 every 2 hours until 12 hours without a BM.   LORazepam 0.5 MG tablet Commonly known as: ATIVAN TAKE 1 TABLET (0.5 MG TOTAL) BY MOUTH EVERY 8 (EIGHT) HOURS AS NEEDED FOR NAUSEA   metoCLOPramide 10 MG tablet Commonly known as: REGLAN Take 0.5 tablets (5 mg total) by mouth as needed for nausea (nausea/reflux).   metoprolol tartrate 50 MG tablet Commonly known as: LOPRESSOR Take 1 tablet (50 mg total) by mouth 2 (two) times daily.   NovoLOG FlexPen 100 UNIT/ML FlexPen Generic drug: insulin aspart Inject 15 Units into the skin 3 (three) times daily with meals.   ondansetron 8 MG disintegrating  tablet Commonly known as: ZOFRAN-ODT Take 1 tablet (8 mg total) by mouth every 8 (eight) hours as needed for nausea.   ondansetron 8 MG tablet Commonly known as: ZOFRAN Take 1 tablet (8 mg total) by mouth 2 (two) times daily. As needed.  Start on Day 3 after chemotherapy.   Pen Needles 30G X 8 MM Misc 1 each by Does not apply route as directed.   prochlorperazine 10 MG tablet Commonly  known as: COMPAZINE TAKE 1 TABLET BY MOUTH EVERY 6 HOURS AS NEEDED FOR NAUSEA & VOMITING   UNABLE TO FIND Take 720 mg by mouth daily. Med Name: Lumakras 120 mg capsules-take six daily.       Allergies: No Known Allergies  Past Medical History, Surgical history, Social history, and Family History were reviewed and updated.  Review of Systems: Review of Systems  Constitutional: Positive for malaise/fatigue.  HENT: Negative.   Eyes: Negative.   Respiratory: Positive for cough.   Cardiovascular: Positive for leg swelling.  Gastrointestinal: Positive for diarrhea and vomiting.  Genitourinary: Negative.   Musculoskeletal: Positive for myalgias.  Neurological: Positive for tingling.  Endo/Heme/Allergies: Negative.   Psychiatric/Behavioral: Negative.      Physical Exam:  height is '6\' 3"'$  (1.905 m) and weight is 239 lb 1.9 oz (108.5 kg). His temperature is 97.2 F (36.2 C) (abnormal). His blood pressure is 88/55 (abnormal) and his pulse is 88. His respiration is 17 and oxygen saturation is 100%.   Wt Readings from Last 3 Encounters:  07/23/20 239 lb 1.9 oz (108.5 kg)  07/15/20 (!) 230 lb (104.3 kg)  06/18/20 228 lb (103.4 kg)    Physical Exam Vitals reviewed.  HENT:     Head: Normocephalic and atraumatic.  Eyes:     Pupils: Pupils are equal, round, and reactive to light.  Cardiovascular:     Rate and Rhythm: Normal rate and regular rhythm.     Heart sounds: Normal heart sounds.  Pulmonary:     Effort: Pulmonary effort is normal.     Breath sounds: Normal breath sounds.  Abdominal:      General: Bowel sounds are normal.     Palpations: Abdomen is soft.  Musculoskeletal:        General: No tenderness or deformity. Normal range of motion.     Cervical back: Normal range of motion.  Lymphadenopathy:     Cervical: No cervical adenopathy.  Skin:    General: Skin is warm and dry.     Findings: No erythema or rash.  Neurological:     Mental Status: He is alert and oriented to person, place, and time.  Psychiatric:        Behavior: Behavior normal.        Thought Content: Thought content normal.        Judgment: Judgment normal.    .  Lab Results  Component Value Date   WBC 12.5 (H) 07/23/2020   HGB 11.1 (L) 07/23/2020   HCT 35.4 (L) 07/23/2020   MCV 102.0 (H) 07/23/2020   PLT 351 07/23/2020   No results found for: FERRITIN, IRON, TIBC, UIBC, IRONPCTSAT Lab Results  Component Value Date   RBC 3.47 (L) 07/23/2020   No results found for: KPAFRELGTCHN, LAMBDASER, KAPLAMBRATIO No results found for: IGGSERUM, IGA, IGMSERUM No results found for: Odetta Pink, SPEI   Chemistry      Component Value Date/Time   NA 142 07/23/2020 1545   K 2.8 (LL) 07/23/2020 1545   CL 103 07/23/2020 1545   CO2 30 07/23/2020 1545   BUN 13 07/23/2020 1545   CREATININE 0.87 07/23/2020 1545   CREATININE 0.75 03/08/2020 1058      Component Value Date/Time   CALCIUM 8.4 (L) 07/23/2020 1545   ALKPHOS 1,427 (H) 07/23/2020 1545   AST 102 (H) 07/23/2020 1545   ALT 64 (H) 07/23/2020 1545   BILITOT 11.7 ([place]) 07/23/2020 1545  Impression and Plan: Ms. Labreck is a very pleasant 59 yo caucasian gentleman with metastatic adenocarcinoma of the pancreas with extensive disease involving the liver, lungs and lymph nodes.   I hate that he is declining quickly.  Again, will be liver failure that will ultimately be the cause of his passing on to [facility].  I know that he and his wife have a strong faith.  I appreciate their faith.  I know  that he will win.  We will have to see about getting the ascites removed.  We may need to get a drainage catheter put in so that the ascites can be drained at home.  I just hate the fact that he will not be back in the office.  He always made [place] feel better.  He was made his smile.  He will be truly missed by all of [place].  However, I know that I will see him again and we will walk together on the streets paved with gold.   Midge Momon, Rudell Cobb  MD

## 2020-07-23 NOTE — Telephone Encounter (Signed)
Panic Bilirubin and panic potassium reported by Melvin [name] in lab.  Dr Marin Olp notified.  Seeing patient in office today.

## 2020-07-25 LAB — CANCER ANTIGEN 19-9: CA 19-9: 537654 U/mL — ABNORMAL HIGH (ref 0–35)

## 2020-07-26 ENCOUNTER — Other Ambulatory Visit: Payer: Self-pay | Admitting: *Deleted

## 2020-07-26 ENCOUNTER — Encounter: Payer: Self-pay | Admitting: *Deleted

## 2020-07-26 ENCOUNTER — Telehealth: Payer: Self-pay | Admitting: *Deleted

## 2020-07-26 NOTE — Telephone Encounter (Signed)
Call received from [facility] with [facility] to obtain information for patient's admission to hospice.  Information given and Dr. Marin Olp notified.

## 2020-07-26 NOTE — Progress Notes (Signed)
Oncology Nurse Navigator Documentation  Oncology Nurse Navigator Flowsheets 07/26/2020  Abnormal Finding Date -  Diagnosis Status -  Planned Course of Treatment -  Phase of Treatment -  Chemotherapy Actual Start Date: -  Navigator Follow Up Date: -  Navigator Follow Up Reason: -  Navigation Complete Date: 07/26/2020  Post Navigation: Continue to Follow Patient? No  Reason Not Navigating Patient: Hospice/Death  Navigator Location [facility]  Referral Date to RadOnc/MedOnc -  Navigator Encounter Type Appt/Treatment Plan Review  Telephone -  Treatment Initiated Date -  Patient Visit Type MedOnc  Treatment Phase Post-Tx Follow-up  Barriers/Navigation Needs Anxiety;Financial Toxicity;Morbidities/Frailty;Pain;Underinsured  Education -  Interventions None Required  Acuity Level 3-Moderate Needs (3-4 Barriers Identified)  Referrals -  Coordination of Care -  Education Method -  Support Groups/Services Friends and Family  Time Spent with Patient 15

## 2020-07-26 NOTE — Telephone Encounter (Signed)
Message received from patient's wife, Violet stating that pt.'s blood sugar registered "high" before lunch today and that she gave pt 16 units of insulin via flexpen after lunch at 1230.  When she rechecked pt.'s blood sugar at 1330, pt.'s blood sugar remained "high" and would like to know what to do now.  Dr. Marin Olp notified.  Pt.'s wife notified per order of Dr. Marin Olp to give pt additional 10 units of insulin now via flexpen.  Teach back done.  Pt.'s wife appreciative of assistance and has no further questions at this time.

## 2020-07-28 ENCOUNTER — Other Ambulatory Visit: Payer: Self-pay

## 2020-07-28 ENCOUNTER — Other Ambulatory Visit: Payer: Self-pay | Admitting: Student

## 2020-07-28 ENCOUNTER — Ambulatory Visit (HOSPITAL_COMMUNITY)
Admission: RE | Admit: 2020-07-28 | Discharge: 2020-07-28 | Disposition: A | Discharge status: Home / Self Care | Payer: PRIVATE HEALTH INSURANCE | Source: Ambulatory Visit | Attending: Hematology & Oncology | Admitting: Hematology & Oncology

## 2020-07-28 DIAGNOSIS — C259 Malignant neoplasm of pancreas, unspecified: Secondary | ICD-10-CM | POA: Diagnosis present

## 2020-07-28 DIAGNOSIS — C787 Secondary malignant neoplasm of liver and intrahepatic bile duct: Secondary | ICD-10-CM | POA: Diagnosis present

## 2020-07-28 IMAGING — US US PARACENTESIS
1 series · 5 of 5 positions shown · non-contrast
Comparison: none

INDICATION: Patient with a history of metastatic pancreatic cancer and recurrent
large volume ascites. Interventional radiology asked to perform a
therapeutic paracentesis.

[Series 1: us paracentesis · 5 of 5 slices shown]
[im 1/5]
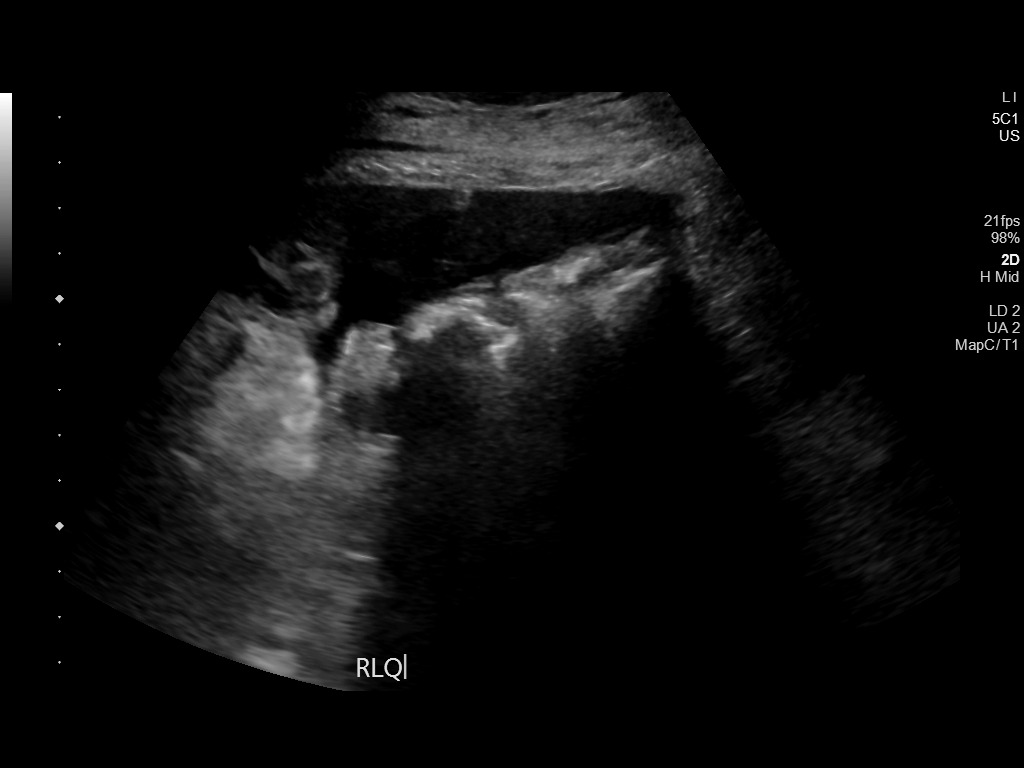
[im 2/5]
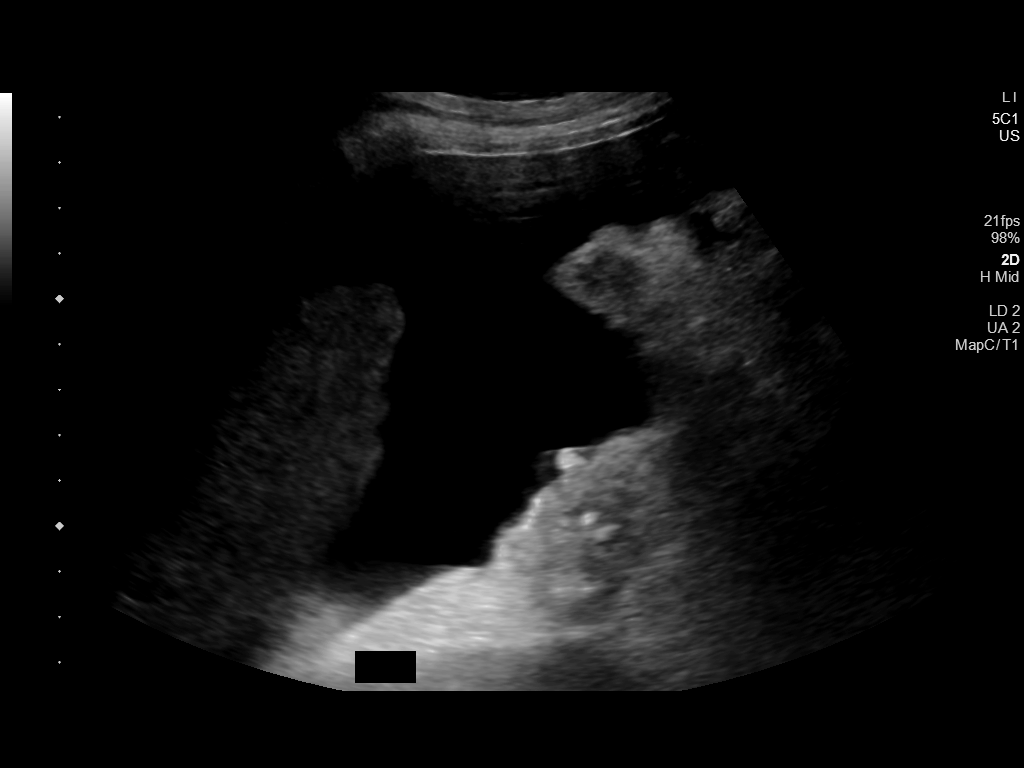
[im 3/5]
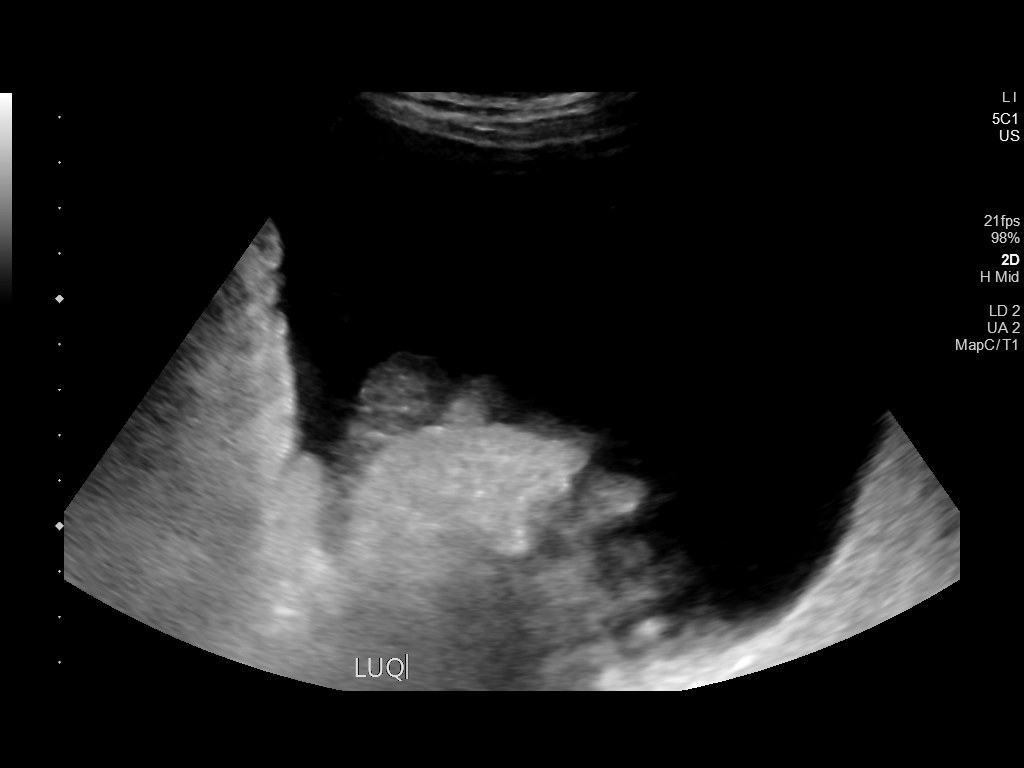
[im 4/5]
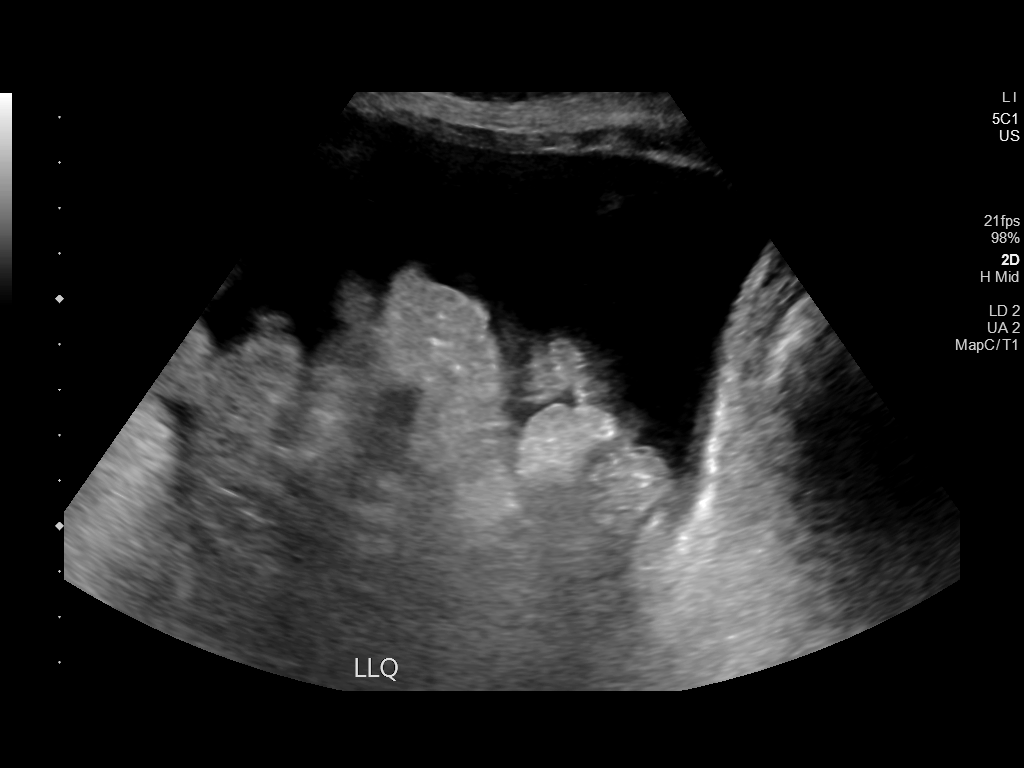
[im 5/5]
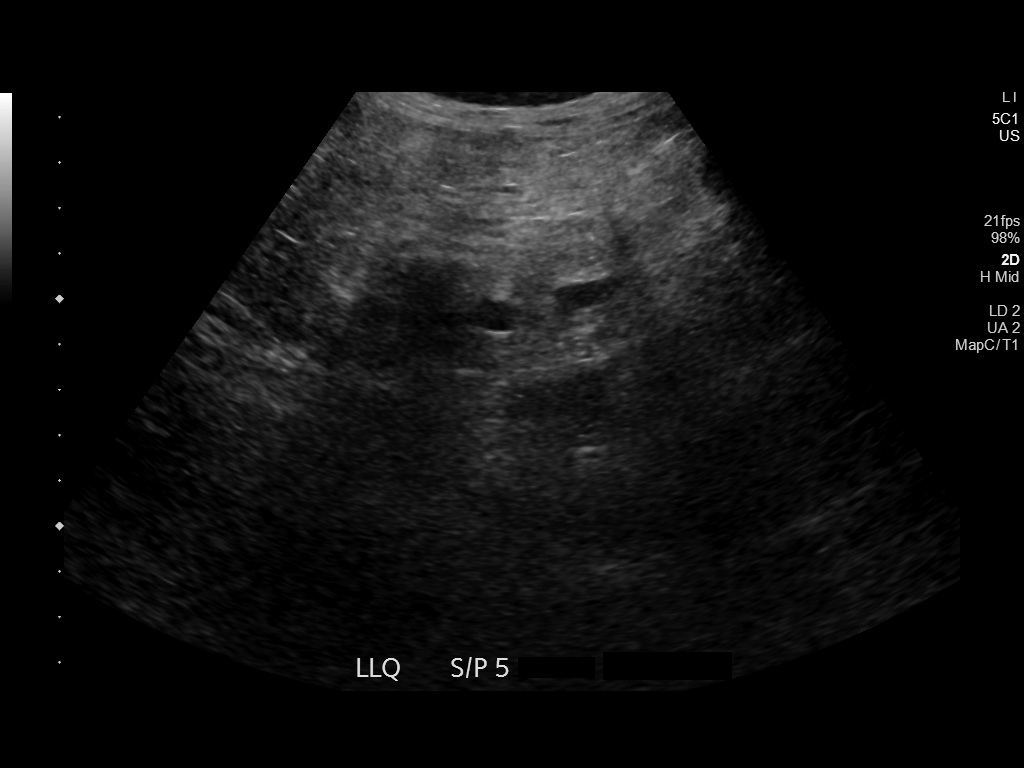

[5 of 5 positions shown; findings below may reference images not displayed]

EXAM:
ULTRASOUND GUIDED PARACENTESIS

MEDICATIONS:
1% lidocaine 10 mL

COMPLICATIONS:
None immediate.

PROCEDURE:
Informed written consent was obtained from the patient after a
discussion of the risks, benefits and alternatives to treatment. A
timeout was performed prior to the initiation of the procedure.

Initial ultrasound scanning demonstrates a large amount of ascites
within the left lower abdominal quadrant. The left lower abdomen was
prepped and draped in the usual sterile fashion. 1% lidocaine was
used for local anesthesia.

Following this, a 19 gauge, 7-cm, Yueh catheter was introduced. An
ultrasound image was saved for documentation purposes. The
paracentesis was performed. The catheter was removed and a dressing
was applied. The patient tolerated the procedure well without
immediate post procedural complication.
FINDINGS: A total of approximately 5 L of amber fluid was removed.
IMPRESSION: Successful ultrasound-guided paracentesis yielding 5 liters of
peritoneal fluid. Read by: KRISTTIAN, NP

## 2020-07-28 MED ORDER — LIDOCAINE HCL 1 % IJ SOLN
INTRAMUSCULAR | Status: AC
  Filled 2020-07-28: qty 20

## 2020-07-28 NOTE — Procedures (Signed)
PROCEDURE SUMMARY:  Successful US guided paracentesis from left lateral abdomen.  Yielded 5L of amber fluid.  No immediate complications.  Pt tolerated well.   EBL < 32mL  Theresa Duty, NP 07/28/2020 10:34 AM

## 2020-07-29 ENCOUNTER — Other Ambulatory Visit: Payer: PRIVATE HEALTH INSURANCE

## 2020-07-29 ENCOUNTER — Ambulatory Visit: Payer: PRIVATE HEALTH INSURANCE | Admitting: Hematology & Oncology

## 2020-08-02 ENCOUNTER — Other Ambulatory Visit: Payer: Self-pay | Admitting: Family

## 2020-08-02 DIAGNOSIS — C259 Malignant neoplasm of pancreas, unspecified: Secondary | ICD-10-CM

## 2020-08-02 DIAGNOSIS — R180 Malignant ascites: Secondary | ICD-10-CM

## 2020-08-02 DIAGNOSIS — C787 Secondary malignant neoplasm of liver and intrahepatic bile duct: Secondary | ICD-10-CM

## 2020-08-02 MED FILL — LORazepam 0.5 MG TABS: 0.5 | 7 days supply | Qty: 42 | Fill #0

## 2020-08-05 MED FILL — ONDANSETRON HCL 8 MG TABLET: 8 | 10 days supply | Qty: 40 | Fill #0

## 2020-08-06 ENCOUNTER — Other Ambulatory Visit: Payer: Self-pay | Admitting: Hematology & Oncology

## 2020-08-06 DIAGNOSIS — C259 Malignant neoplasm of pancreas, unspecified: Secondary | ICD-10-CM

## 2020-08-06 DIAGNOSIS — C7800 Secondary malignant neoplasm of unspecified lung: Secondary | ICD-10-CM

## 2020-08-06 DIAGNOSIS — C799 Secondary malignant neoplasm of unspecified site: Secondary | ICD-10-CM

## 2020-08-06 MED FILL — ATROPINE 1% EYE DROPS: 1 | 13 days supply | Qty: 15 | Fill #0

## 2020-08-06 MED FILL — MORPHINE SULF 100 MG/5 ML S: 100 | 10 days supply | Qty: 30 | Fill #0

## 2020-08-06 MED FILL — HALOPERIDOL 1 MG TABS: 1 | 9 days supply | Qty: 56 | Fill #0

## 2020-08-06 MED FILL — HYDROCODON-APAP 5-325: 5-325 | 15 days supply | Qty: 120 | Fill #0

## 2020-08-06 MED FILL — BISACODYL 10 MG SUPP: 10 | 14 days supply | Qty: 14 | Fill #0

## 2020-08-09 ENCOUNTER — Ambulatory Visit: Payer: PRIVATE HEALTH INSURANCE | Admitting: Osteopathic Medicine

## 2020-08-11 ENCOUNTER — Encounter: Payer: Self-pay | Admitting: Pharmacy Technician

## 2020-08-11 NOTE — Progress Notes (Signed)
Patient no longer getting Abraxane from [facility] based on hospice. Last DOS covered is 06/18/20.

## 2020-08-13 ENCOUNTER — Telehealth: Payer: Self-pay | Admitting: *Deleted

## 2020-08-13 NOTE — Telephone Encounter (Signed)
Call placed to [facility] to check on patient's status.  Pt passed away on 2020-08-27 at 5:12PM per hospice nurse.  Dr. Marin Olp notified.

## 2020-08-16 ENCOUNTER — Other Ambulatory Visit (HOSPITAL_COMMUNITY): Payer: PRIVATE HEALTH INSURANCE

## 2020-08-16 ENCOUNTER — Ambulatory Visit (HOSPITAL_COMMUNITY): Payer: PRIVATE HEALTH INSURANCE

## 2020-08-18 DEATH — deceased

## 2021-01-27 IMAGING — CR DG FEMUR 2+V*R*
4 series · 4 of 4 positions shown · non-contrast
Comparison: None.

CLINICAL DATA: Clot in right leg

EXAM:
RIGHT FEMUR 2 VIEWS

[x femur proximal ap right (1 of 2)]
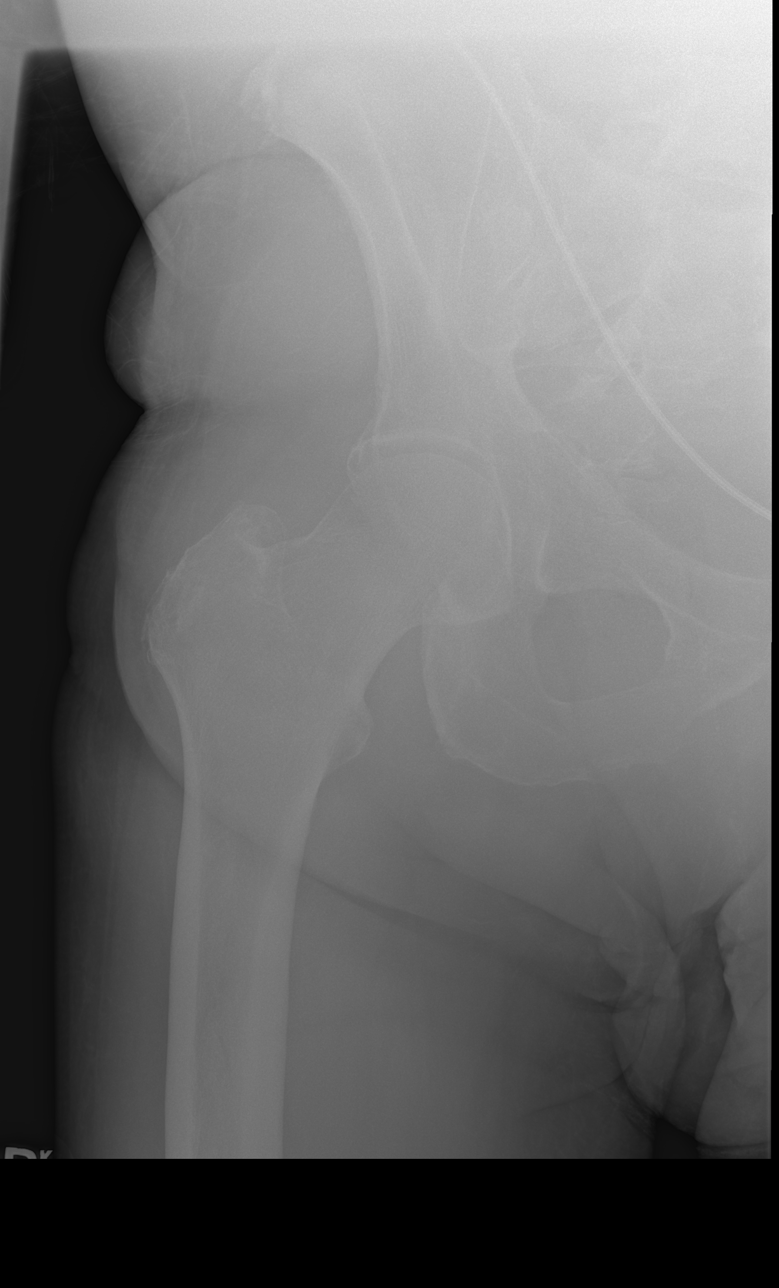

[x femur proximal ap right (2 of 2)]
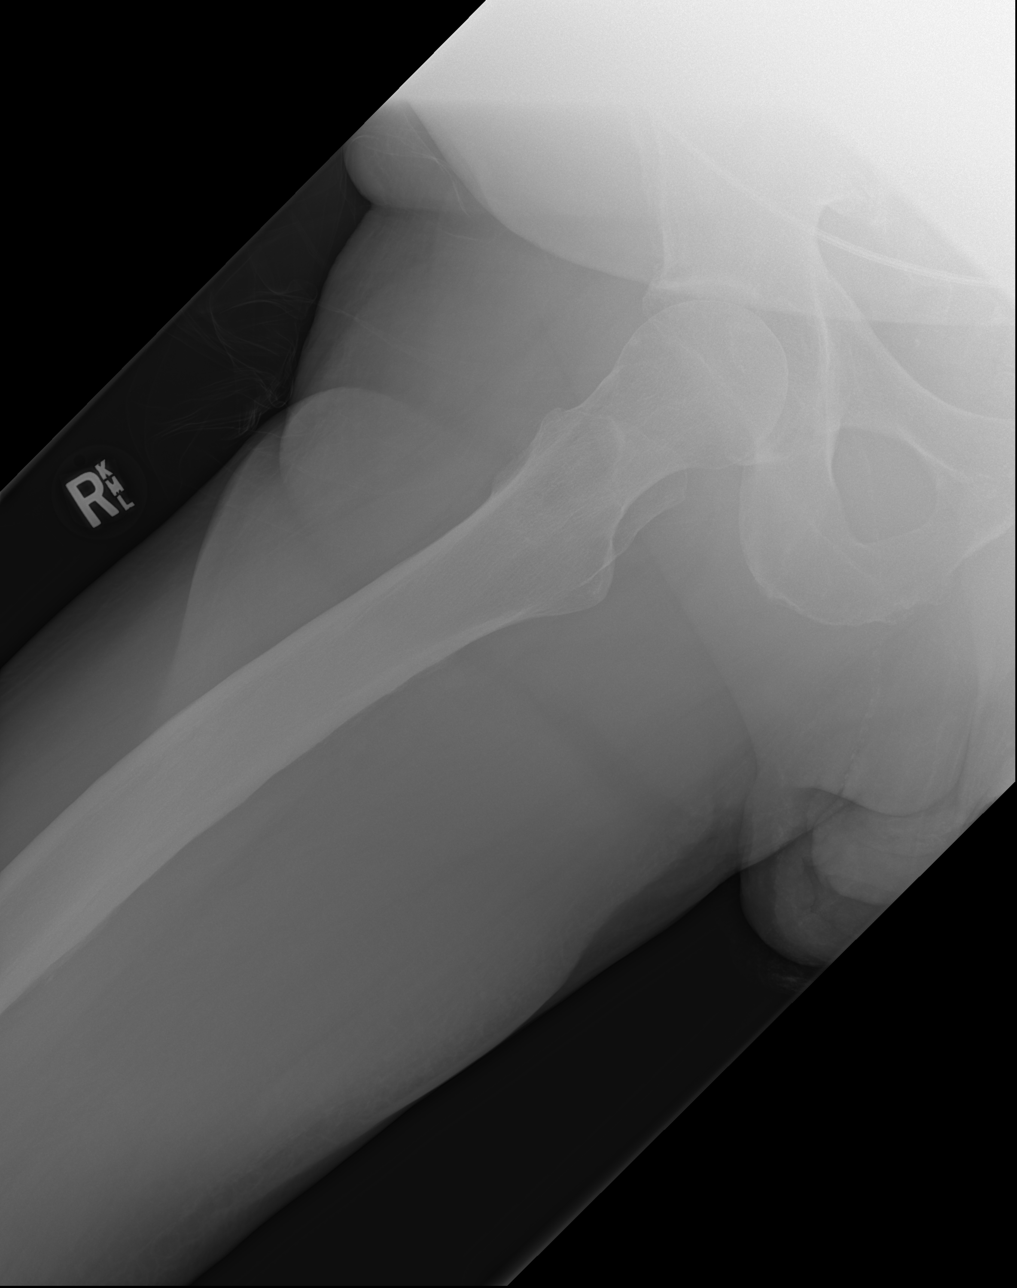

[x femur distal ap right (1 of 2)]
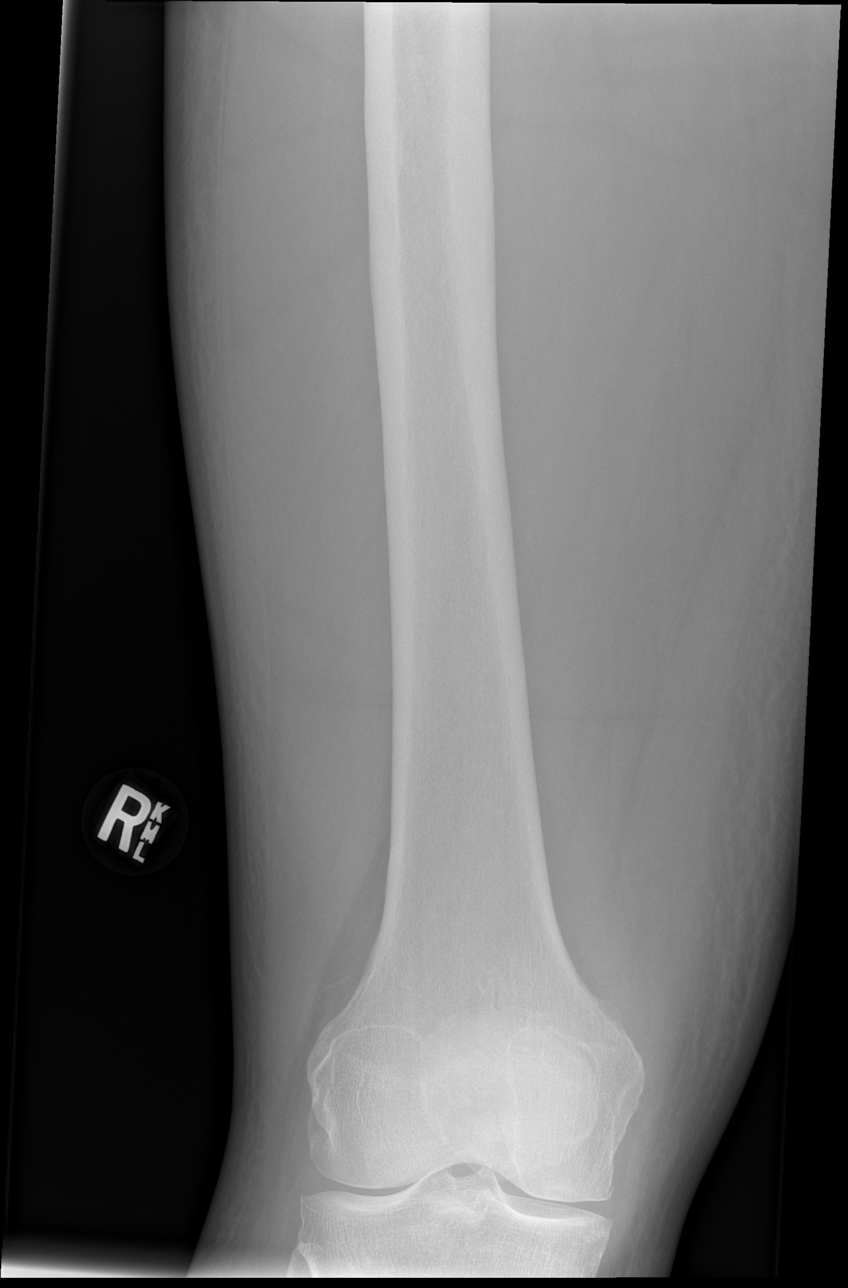

[x femur distal ap right (2 of 2)]
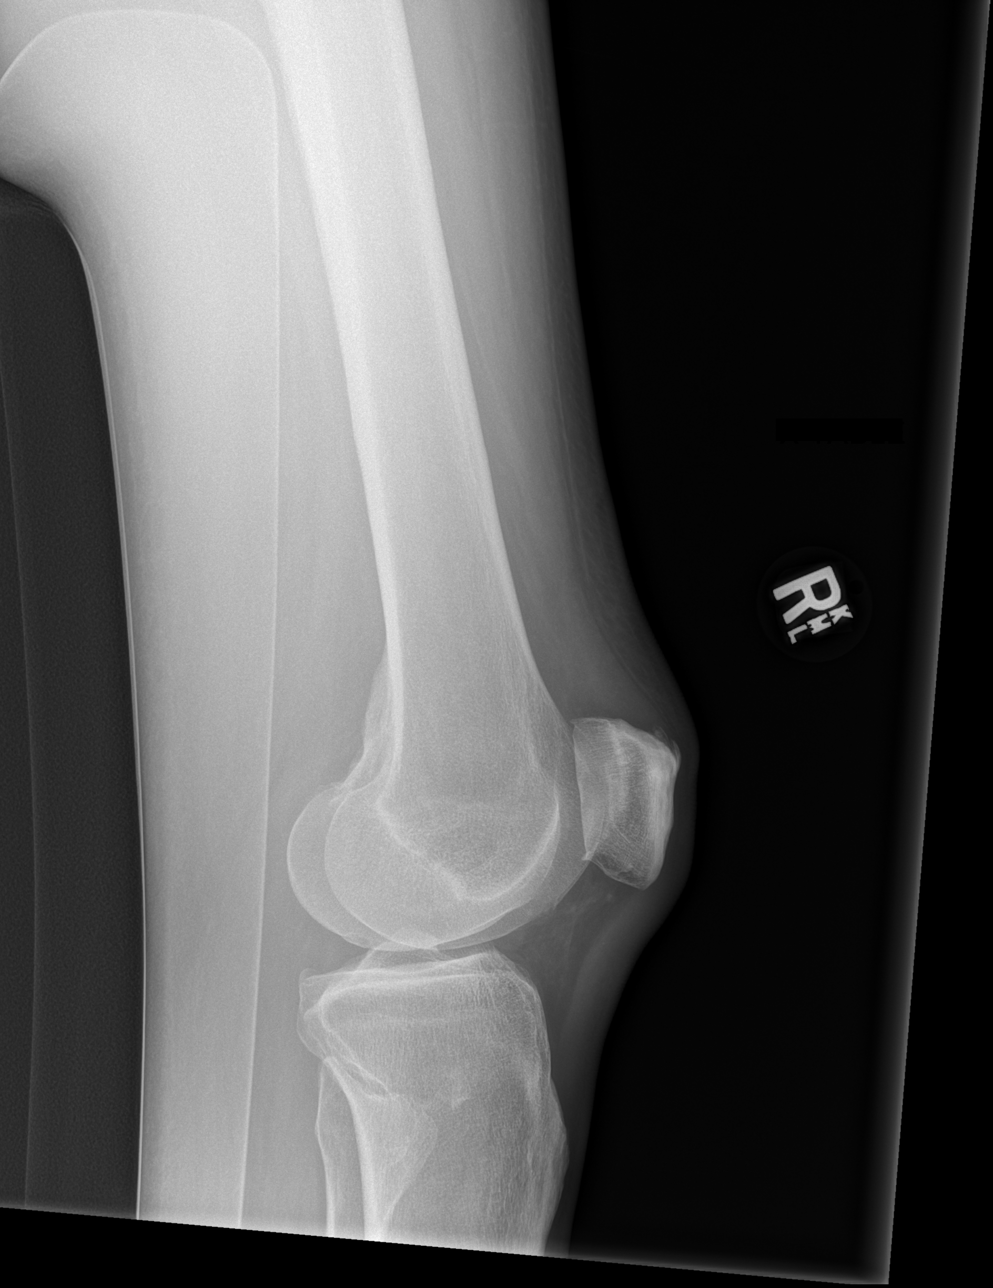

[4 of 4 positions shown; findings below may reference images not displayed]

FINDINGS: There is no evidence of fracture or other focal bone lesions. Soft
tissues are unremarkable.
IMPRESSION: Negative.
